# Patient Record
Sex: Female | Born: 1980
Health system: Southern US, Community
[De-identification: ages and names within clinical notes are randomized; demographics above are authoritative.]

## PROBLEM LIST (undated history)

## (undated) DIAGNOSIS — M549 Dorsalgia, unspecified: Secondary | ICD-10-CM

## (undated) DIAGNOSIS — M2550 Pain in unspecified joint: Secondary | ICD-10-CM

## (undated) DIAGNOSIS — B009 Herpesviral infection, unspecified: Secondary | ICD-10-CM

## (undated) DIAGNOSIS — J45990 Exercise induced bronchospasm: Secondary | ICD-10-CM

## (undated) DIAGNOSIS — I471 Supraventricular tachycardia: Secondary | ICD-10-CM

## (undated) DIAGNOSIS — R609 Edema, unspecified: Secondary | ICD-10-CM

## (undated) DIAGNOSIS — R112 Nausea with vomiting, unspecified: Secondary | ICD-10-CM

## (undated) DIAGNOSIS — E785 Hyperlipidemia, unspecified: Secondary | ICD-10-CM

## (undated) DIAGNOSIS — J45909 Unspecified asthma, uncomplicated: Secondary | ICD-10-CM

## (undated) DIAGNOSIS — L509 Urticaria, unspecified: Secondary | ICD-10-CM

## (undated) DIAGNOSIS — R002 Palpitations: Secondary | ICD-10-CM

## (undated) DIAGNOSIS — L309 Dermatitis, unspecified: Secondary | ICD-10-CM

## (undated) DIAGNOSIS — G4700 Insomnia, unspecified: Secondary | ICD-10-CM

## (undated) DIAGNOSIS — K219 Gastro-esophageal reflux disease without esophagitis: Secondary | ICD-10-CM

## (undated) DIAGNOSIS — K5900 Constipation, unspecified: Secondary | ICD-10-CM

## (undated) DIAGNOSIS — E559 Vitamin D deficiency, unspecified: Secondary | ICD-10-CM

## (undated) DIAGNOSIS — Z9889 Other specified postprocedural states: Secondary | ICD-10-CM

## (undated) DIAGNOSIS — F419 Anxiety disorder, unspecified: Secondary | ICD-10-CM

## (undated) DIAGNOSIS — R0602 Shortness of breath: Secondary | ICD-10-CM

## (undated) DIAGNOSIS — E039 Hypothyroidism, unspecified: Secondary | ICD-10-CM

## (undated) DIAGNOSIS — T7840XA Allergy, unspecified, initial encounter: Secondary | ICD-10-CM

## (undated) HISTORY — DX: Palpitations: R00.2

## (undated) HISTORY — DX: Hypothyroidism, unspecified: E03.9

## (undated) HISTORY — DX: Insomnia, unspecified: G47.00

## (undated) HISTORY — DX: Pain in unspecified joint: M25.50

## (undated) HISTORY — DX: Hyperlipidemia, unspecified: E78.5

## (undated) HISTORY — DX: Edema, unspecified: R60.9

## (undated) HISTORY — DX: Supraventricular tachycardia: I47.1

## (undated) HISTORY — DX: Unspecified asthma, uncomplicated: J45.909

## (undated) HISTORY — DX: Constipation, unspecified: K59.00

## (undated) HISTORY — DX: Gastro-esophageal reflux disease without esophagitis: K21.9

## (undated) HISTORY — DX: Vitamin D deficiency, unspecified: E55.9

## (undated) HISTORY — DX: Herpesviral infection, unspecified: B00.9

## (undated) HISTORY — DX: Exercise induced bronchospasm: J45.990

## (undated) HISTORY — DX: Allergy, unspecified, initial encounter: T78.40XA

## (undated) HISTORY — DX: Dermatitis, unspecified: L30.9

## (undated) HISTORY — DX: Urticaria, unspecified: L50.9

## (undated) HISTORY — DX: Dorsalgia, unspecified: M54.9

## (undated) HISTORY — PX: OTHER SURGICAL HISTORY: SHX169

## (undated) HISTORY — DX: Shortness of breath: R06.02

## (undated) HISTORY — DX: Anxiety disorder, unspecified: F41.9

---
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
## 2001-10-21 ENCOUNTER — Encounter: Admission: RE | Admit: 2001-10-21 | Discharge: 2001-10-21 | Payer: Self-pay | Admitting: Family Medicine

## 2001-10-21 ENCOUNTER — Encounter: Payer: Self-pay | Admitting: Family Medicine

## 2006-01-17 ENCOUNTER — Emergency Department (HOSPITAL_COMMUNITY): Admission: EM | Admit: 2006-01-17 | Discharge: 2006-01-17 | Payer: Self-pay | Admitting: Emergency Medicine

## 2007-12-22 ENCOUNTER — Other Ambulatory Visit: Admission: RE | Admit: 2007-12-22 | Discharge: 2007-12-22 | Payer: Self-pay | Admitting: Obstetrics and Gynecology

## 2008-12-31 ENCOUNTER — Other Ambulatory Visit: Admission: RE | Admit: 2008-12-31 | Discharge: 2008-12-31 | Payer: Self-pay | Admitting: Obstetrics and Gynecology

## 2010-02-07 ENCOUNTER — Other Ambulatory Visit: Admission: RE | Admit: 2010-02-07 | Discharge: 2010-02-07 | Payer: Self-pay | Admitting: Obstetrics and Gynecology

## 2011-02-16 ENCOUNTER — Other Ambulatory Visit: Payer: Self-pay | Admitting: Obstetrics and Gynecology

## 2011-02-16 ENCOUNTER — Other Ambulatory Visit (HOSPITAL_COMMUNITY)
Admission: RE | Admit: 2011-02-16 | Discharge: 2011-02-16 | Disposition: A | Discharge status: Home / Self Care | Payer: PRIVATE HEALTH INSURANCE | Source: Ambulatory Visit | Attending: Obstetrics and Gynecology | Admitting: Obstetrics and Gynecology

## 2011-02-16 DIAGNOSIS — Z01419 Encounter for gynecological examination (general) (routine) without abnormal findings: Secondary | ICD-10-CM | POA: Insufficient documentation

## 2012-02-23 ENCOUNTER — Other Ambulatory Visit: Payer: Self-pay | Admitting: Obstetrics and Gynecology

## 2012-02-23 ENCOUNTER — Other Ambulatory Visit (HOSPITAL_COMMUNITY)
Admission: RE | Admit: 2012-02-23 | Discharge: 2012-02-23 | Disposition: A | Discharge status: Home / Self Care | Payer: PRIVATE HEALTH INSURANCE | Source: Ambulatory Visit | Attending: Obstetrics and Gynecology | Admitting: Obstetrics and Gynecology

## 2012-02-23 DIAGNOSIS — Z01419 Encounter for gynecological examination (general) (routine) without abnormal findings: Secondary | ICD-10-CM | POA: Insufficient documentation

## 2013-03-01 ENCOUNTER — Other Ambulatory Visit (HOSPITAL_COMMUNITY)
Admission: RE | Admit: 2013-03-01 | Discharge: 2013-03-01 | Disposition: A | Discharge status: Home / Self Care | Payer: BC Managed Care – PPO | Source: Ambulatory Visit | Attending: Obstetrics and Gynecology | Admitting: Obstetrics and Gynecology

## 2013-03-01 ENCOUNTER — Other Ambulatory Visit: Payer: Self-pay | Admitting: Obstetrics and Gynecology

## 2013-03-01 DIAGNOSIS — Z1151 Encounter for screening for human papillomavirus (HPV): Secondary | ICD-10-CM | POA: Insufficient documentation

## 2013-03-01 DIAGNOSIS — Z01419 Encounter for gynecological examination (general) (routine) without abnormal findings: Secondary | ICD-10-CM | POA: Insufficient documentation

## 2013-06-22 ENCOUNTER — Other Ambulatory Visit: Payer: Self-pay | Admitting: Physician Assistant

## 2016-03-11 ENCOUNTER — Other Ambulatory Visit (HOSPITAL_COMMUNITY)
Admission: RE | Admit: 2016-03-11 | Discharge: 2016-03-11 | Disposition: A | Discharge status: Home / Self Care | Payer: BLUE CROSS/BLUE SHIELD | Source: Ambulatory Visit | Attending: Obstetrics and Gynecology | Admitting: Obstetrics and Gynecology

## 2016-03-11 ENCOUNTER — Other Ambulatory Visit: Payer: Self-pay | Admitting: Obstetrics and Gynecology

## 2016-03-11 DIAGNOSIS — Z01419 Encounter for gynecological examination (general) (routine) without abnormal findings: Secondary | ICD-10-CM | POA: Insufficient documentation

## 2016-03-11 DIAGNOSIS — Z1151 Encounter for screening for human papillomavirus (HPV): Secondary | ICD-10-CM | POA: Diagnosis present

## 2016-03-12 LAB — CYTOLOGY - PAP

## 2016-06-10 ENCOUNTER — Ambulatory Visit (INDEPENDENT_AMBULATORY_CARE_PROVIDER_SITE_OTHER): Payer: BLUE CROSS/BLUE SHIELD | Admitting: Allergy

## 2016-06-10 ENCOUNTER — Encounter: Payer: Self-pay | Admitting: Allergy

## 2016-06-10 VITALS — BP 110/75 | HR 75 | Temp 98.0°F | Resp 15 | Ht 64.17 in | Wt 218.4 lb

## 2016-06-10 DIAGNOSIS — J45990 Exercise induced bronchospasm: Secondary | ICD-10-CM | POA: Diagnosis not present

## 2016-06-10 DIAGNOSIS — J301 Allergic rhinitis due to pollen: Secondary | ICD-10-CM | POA: Diagnosis not present

## 2016-06-10 DIAGNOSIS — L501 Idiopathic urticaria: Secondary | ICD-10-CM

## 2016-06-10 MED ORDER — ALBUTEROL SULFATE HFA 108 (90 BASE) MCG/ACT IN AERS: 2.0000 | INHALATION_SPRAY | Freq: Four times a day (QID) | RESPIRATORY_TRACT | 2 refills | Status: DC | PRN

## 2016-06-10 MED ORDER — LEVOCETIRIZINE DIHYDROCHLORIDE 5 MG PO TABS: 5.0000 mg | ORAL_TABLET | Freq: Every evening | ORAL | 1 refills | Status: DC

## 2016-06-10 NOTE — Patient Instructions (Addendum)
1. Allergic rhinitis due to pollen Seasonal and year-round allergies to grass, weeds, mold, dust mites and cat.   Start Xyzal and OTC nasal steroid spray (ie. Flonase, Nasacort) 1-2 sprays each nostril daily.  Demonstrated appropriate nasal spray technique. Will start with medication management.  If symptoms continue despite above medications may benefit from trial of Singulair.   Discussed Allergen immunotherapy today and provided with information packet and consent.    2. Idiopathic urticaria Hives without concerning features and are intermittent.  Advised use of antihistamine (xyzal) at onset of hives and continue use until hives have resolved.  May use up to twice day dosing if hives persist and can call if not improving.   Discussed strawberry is known to cause histamine release especially in large quantities.  Skin testing negative to foods today.  Does not need to completely avoid strawberry but know it may cause hives and would stop ingestion if hives returns with ingestion.    3. Exercise-induced bronchospasm Chest-tightness and wheezing with exercise only.  Spirometry today is normal.   Will prescribe albuterol for use prior to activity and as needed.   Keep track of frequency of albuterol use to relieve symptoms.    FU 3-4 mo

## 2016-06-10 NOTE — Progress Notes (Signed)
New Patient Note  RE: Madison [name] MRN: [id number] DOB: 11-Aug-1981 Date of Office Visit: 06/10/2016  Referring provider: No ref. provider found Primary care provider: REDMON,NOELLE, PA-C  Chief Complaint: nasal congestion, wheezing, hives  History of present illness: Madison [name] is a 35 y.o. female presenting today for evaluation of environmental allergy, hives and wheezing with activity.    Allergy - She has nasal congestion, occasional sinus pressure with weather change and sneezing.  Symptoms worse in spring and summer.  Denies ocular symptoms.  Using claritin nightly with atarax which reports helps nasal congestion.  Has saline nasal spray that she has used occasionally.  Has tried zyrtec-makes sleepy and allegra - feels too dry.  Has not tried nasal steroid sprays.  Also notices around cats becomes itchy.    She has no pets in her apartment but boyfriend with cat.  Also reports her part-time job at [employer] is dusty and nasal symptoms are worse there.    Hives - developed hives in Oct 2016 and again in May 2017.  Both times she went to a walk-in clinic for rash that developed on arms and chest. Recalls being treated with dexomethasone shot.  Rash was red raised bumps, very itchy.  Hives do not last longer than 24 hours.  No associated swelling, fever, joint pain/arthralgias.  Never had hives prior to last year.  She did noticed that she developed hives after large strawberry ingestion and believes milk/dairy may be a trigger.  After dairy ingestion reports hives with to 1hr as well as stomach ache and nausea.        Wheezing/chest-tightness:  When she runs notices some chest tightness and afterwards will have some wheezing.  Started about 1 mo ago.  Also reports strong perfumes with elicit wheezing and coughing.  Has used symbicort before with cold with coughing and did have improve about 26mo ago.  Has never used albuterol.  No oral steroids or hospitalizations.  No history of  asthma diagnosis.    Other allergy - Had patch testing thru dermatology and was found to be allergic latex and neosporin.  Reports at previous job with latex gloves would get bumpy rash. She avoids latex and neosporin.     Review of systems: Review of Systems  Constitutional: Negative for fever.  Cardiovascular: Negative.   Gastrointestinal: Negative for diarrhea and vomiting.  Musculoskeletal: Negative for joint pain.  Neurological: Negative for headaches.  All other systems reviewed and are negative.    Past medical history: Past Medical History:  Diagnosis Date  . Allergy   . Herpes simplex disease   . Hypothyroidism     Past surgical history: Past Surgical History:  Procedure Laterality Date  . KNEE ARTHROSCOPY Right 2001  . WISDOM TOOTH EXTRACTION Bilateral 1997    Family history:  Family History  Problem Relation Age of Onset  . Eczema Father   . Allergic rhinitis Neg Hx   . Angioedema Neg Hx   . Asthma Neg Hx   . Atopy Neg Hx   . Immunodeficiency Neg Hx   . Urticaria Neg Hx     Social history: Social History  :  Lives in apartment with carpeting. No pets. Electric heat and central cooling.    Social History  . Marital status: Single   Occupational History  . CMA    Social History Main Topics  . Smoking status: Never Smoker  . Smokeless tobacco: Never Used  . Alcohol use Yes  .  Drug use: No  . Sexual activity: Not on file     Medication List:   Medication List       Accurate as of 06/10/16 12:52 PM. Always use your most recent med list.          albuterol 108 (90 Base) MCG/ACT inhaler Commonly known as:  PROVENTIL HFA;VENTOLIN HFA Inhale 2 puffs into the lungs every 6 (six) hours as needed for wheezing or shortness of breath. May use 10-20 prior to activity.   drospirenone-ethinyl estradiol 3-0.03 MG tablet Commonly known as:  YASMIN,ZARAH,SYEDA   Fish Oil 1000 MG Caps Take 2 capsules by mouth 2 (two) times daily.   levocetirizine  5 MG tablet Commonly known as:  XYZAL Take 1 tablet (5 mg total) by mouth every evening.   levothyroxine 75 MCG tablet Commonly known as:  SYNTHROID, LEVOTHROID   mometasone 0.1 % cream Commonly known as:  ELOCON   naproxen sodium 550 MG tablet Commonly known as:  ANAPROX   triazolam 0.25 MG tablet Commonly known as:  HALCION   valACYclovir 500 MG tablet Commonly known as:  VALTREX       Known medication allergies: Allergies  Allergen Reactions  . Latex Rash  . Neosporin [Neomycin-Bacitracin Zn-Polymyx] Rash     Physical examination: Blood pressure 110/75, pulse 75, temperature 98 F (36.7 C), temperature source Oral, resp. rate 15, height 5' 4.17" (1.63 m), weight 218 lb 6.4 oz (99.1 kg).  General: Alert, interactive, in no acute distress. HEENT: TMs pearly gray, turbinates edematous and pale with crusty discharge, post-pharynx unremarkable. Neck: Supple without lymphadenopathy. Lungs: Clear to auscultation without wheezing, rhonchi or rales. CV: Normal S1, S2 without murmurs. Abdomen: Nondistended, nontender. Skin: erythematous papular lesions on forearms; flesh colored papules on upper arms c/w KP. Extremities:  No clubbing, cyanosis or edema. Neuro:   Grossly intact.  Diagnositics/Labs:  Spirometry: results normal  Allergy testing: positive to grass, weed, mold, dust mites, cat.   Negative for milk and strawberry.  Allergy testing results were read and interpreted by provider, documented by clinical staff.   Assessment and plan:   1. Allergic rhinitis due to pollen Seasonal and year-round allergy to grass, weeds, mold, dust mites and cat.   Start Xyzal and OTC nasal steroid spray (ie. Flonase, Nasacort) 1-2 sprays each nostril daily.  Demonstrated appropriate nasal spray technique. Will start with medication management.  If symptoms continue despite above medications may benefit from trial of Singulair.   Discussed Allergen immunotherapy today and  provided with information packet and consent.  Will plan further discussion on next visit re initiation.    2. Idiopathic urticaria Hives without concerning features and are intermittent.  Advised use of antihistamine (xyzal) at onset of hives and continue use until hives have resolved.  May use up to twice day dosing if hives persist and can call if not improving.   Discussed strawberry is known to cause histamine release especially in large quantities.  Skin testing negative to foods today.  Does not need to completely avoid strawberry but know it may cause hives and would stop ingestion if hives returns with ingestion.    3. Exercise-induced bronchospasm Chest-tightness and wheezing with exercise only.  Spirometry today is normal.   Will prescribe albuterol for use prior to activity and as needed.   Keep track of frequency of albuterol use to relieve symptoms.     Return in about 3 months (around 09/10/2016).  I appreciate the opportunity to take part in Gionni's  care. Please do not hesitate to contact me with questions.  Sincerely,   Margo Aye, MD

## 2016-06-12 DIAGNOSIS — L501 Idiopathic urticaria: Secondary | ICD-10-CM | POA: Insufficient documentation

## 2016-06-12 DIAGNOSIS — J301 Allergic rhinitis due to pollen: Secondary | ICD-10-CM | POA: Insufficient documentation

## 2016-06-12 DIAGNOSIS — J45990 Exercise induced bronchospasm: Secondary | ICD-10-CM

## 2016-06-12 HISTORY — DX: Exercise induced bronchospasm: J45.990

## 2016-07-21 ENCOUNTER — Telehealth: Payer: Self-pay | Admitting: Allergy and Immunology

## 2016-07-22 ENCOUNTER — Ambulatory Visit: Payer: BLUE CROSS/BLUE SHIELD | Admitting: Allergy

## 2016-09-21 ENCOUNTER — Ambulatory Visit: Payer: BLUE CROSS/BLUE SHIELD | Admitting: Allergy

## 2016-11-12 ENCOUNTER — Encounter (HOSPITAL_COMMUNITY): Payer: Self-pay | Admitting: Emergency Medicine

## 2016-11-12 ENCOUNTER — Emergency Department (HOSPITAL_COMMUNITY)
Admission: EM | Admit: 2016-11-12 | Discharge: 2016-11-12 | Disposition: A | Discharge status: Home / Self Care | Payer: BLUE CROSS/BLUE SHIELD | Attending: Emergency Medicine | Admitting: Emergency Medicine

## 2016-11-12 DIAGNOSIS — Z91040 Latex allergy status: Secondary | ICD-10-CM | POA: Diagnosis not present

## 2016-11-12 DIAGNOSIS — Z79899 Other long term (current) drug therapy: Secondary | ICD-10-CM | POA: Diagnosis not present

## 2016-11-12 DIAGNOSIS — I471 Supraventricular tachycardia: Secondary | ICD-10-CM | POA: Insufficient documentation

## 2016-11-12 DIAGNOSIS — R000 Tachycardia, unspecified: Secondary | ICD-10-CM | POA: Diagnosis present

## 2016-11-12 DIAGNOSIS — E039 Hypothyroidism, unspecified: Secondary | ICD-10-CM | POA: Diagnosis not present

## 2016-11-12 MED ORDER — DILTIAZEM HCL ER 60 MG PO CP12
60.0000 mg | ORAL_CAPSULE | Freq: Once | ORAL | Status: AC
  Administered 2016-11-12: 60 mg via ORAL
  Filled 2016-11-12: qty 1

## 2016-11-12 NOTE — Discharge Instructions (Signed)
Recheck with cardiology.  Return to ER with recurrence.

## 2016-11-12 NOTE — ED Triage Notes (Signed)
Patient states she woke up and felt like her heart was beating funny. Patient states this started around 4 am today.

## 2016-11-12 NOTE — ED Provider Notes (Signed)
[facility]-EMERGENCY DEPT Provider Note   CSN: [id number] Arrival date & time: 11/12/16  0455     History   Chief Complaint Chief Complaint  Patient presents with  . Tachycardia    HPI Madison [name] is a 36 y.o. female.  HPI :  She presents for evaluation of palpitations. She was sleeping tonight. Proximal and 4 AM she awakened with a feeling of tachycardia palpitations. This persisted. She was able to drive herself here. She has chest tightness. No chest pain or shortness of breath. Had an episode about 10 years ago where she was seated in a cart in the emergency room. Saw Dr. Carolanne Grumblingracy Turner of cardiology. Was diagnosed with SVT. His only had 1 recurrence since that time this was brief and resolved spontaneously.  History of hypothyroidism. Is on Synthroid 100 mics. This was increased from 75 mics 4 days ago.  Past Medical History:  Diagnosis Date  . Allergy   . Herpes simplex disease   . Hypothyroidism     Patient Active Problem List   Diagnosis Date Noted  . Allergic rhinitis due to pollen 06/12/2016  . Idiopathic urticaria 06/12/2016  . Exercise-induced bronchospasm 06/12/2016    Past Surgical History:  Procedure Laterality Date  . KNEE ARTHROSCOPY Right 2001  . WISDOM TOOTH EXTRACTION Bilateral 1997    OB History    No data available       Home Medications    Prior to Admission medications   Medication Sig Start Date End Date Taking? Authorizing Provider  albuterol (PROVENTIL HFA;VENTOLIN HFA) 108 (90 Base) MCG/ACT inhaler Inhale 2 puffs into the lungs every 6 (six) hours as needed for wheezing or shortness of breath. May use 10-20 prior to activity. 06/10/16  Yes Shaylar Larose HiresPatricia Padgett, MD  drospirenone-ethinyl estradiol Birder Robson(YASMIN,ZARAH,SYEDA) 3-0.03 MG tablet  04/13/16  Yes Historical Provider, MD  hydrOXYzine (ATARAX/VISTARIL) 50 MG tablet Take 1-2 tablets by mouth at bedtime. 08/05/16  Yes Historical Provider, MD  levocetirizine (XYZAL) 5 MG tablet Take 1 tablet (5  mg total) by mouth every evening. 06/10/16  Yes Shaylar Larose HiresPatricia Padgett, MD  levothyroxine (SYNTHROID, LEVOTHROID) 100 MCG tablet Take 100 mcg by mouth daily before breakfast.   Yes Historical Provider, MD  loratadine (CLARITIN) 10 MG tablet Take 10 mg by mouth daily.   Yes Historical Provider, MD  Omega-3 Fatty Acids (FISH OIL) 1000 MG CAPS Take 2 capsules by mouth 2 (two) times daily.   Yes Historical Provider, MD  Red Yeast Rice Extract (RED YEAST RICE PO) Take 1 tablet by mouth 2 (two) times daily.   Yes Historical Provider, MD  valACYclovir (VALTREX) 500 MG tablet Take 500 mg by mouth daily.  03/27/16  Yes Historical Provider, MD    Family History Family History  Problem Relation Age of Onset  . Eczema Father   . Allergic rhinitis Neg Hx   . Angioedema Neg Hx   . Asthma Neg Hx   . Atopy Neg Hx   . Immunodeficiency Neg Hx   . Urticaria Neg Hx     Social History Social History  Substance Use Topics  . Smoking status: Never Smoker  . Smokeless tobacco: Never Used  . Alcohol use Yes     Allergies   Latex; Neosporin [neomycin-bacitracin zn-polymyx]; and Toradol [ketorolac tromethamine]   Review of Systems Review of Systems  Constitutional: Negative for appetite change, chills, diaphoresis, fatigue and fever.  HENT: Negative for mouth sores, sore throat and trouble swallowing.   Eyes: Negative for visual  disturbance.  Respiratory: Positive for chest tightness. Negative for cough, shortness of breath and wheezing.   Cardiovascular: Positive for palpitations. Negative for chest pain.  Gastrointestinal: Negative for abdominal distention, abdominal pain, diarrhea, nausea and vomiting.  Endocrine: Negative for polydipsia, polyphagia and polyuria.  Genitourinary: Negative for dysuria, frequency and hematuria.  Musculoskeletal: Negative for gait problem.  Skin: Negative for color change, pallor and rash.  Neurological: Negative for dizziness, syncope, light-headedness and  headaches.  Hematological: Does not bruise/bleed easily.  Psychiatric/Behavioral: Negative for behavioral problems and confusion.     Physical Exam Updated Vital Signs BP 120/91   Pulse 102   Temp 97.4 F (36.3 C) (Oral)   Resp 18   Ht 5\' 5"  (1.651 m)   Wt 213 lb 12.8 oz (97 kg)   LMP 11/05/2016   SpO2 97%   BMI 35.58 kg/m   Physical Exam  Constitutional: She is oriented to person, place, and time. She appears well-developed and well-nourished. No distress.  Awake and alert. Anxious.  HENT:  Head: Normocephalic.  Eyes: Conjunctivae are normal. Pupils are equal, round, and reactive to light. No scleral icterus.  Neck: Normal range of motion. Neck supple. No thyromegaly present.  Cardiovascular: Normal rate and regular rhythm.  Exam reveals no gallop and no friction rub.   No murmur heard. As sheis being placed in a bed, I am palpating her radial pulse and it does not feel fast. She states that between triage and being placed in the room she thought it "got better".  Pulmonary/Chest: Effort normal and breath sounds normal. No respiratory distress. She has no wheezes. She has no rales.  Abdominal: Soft. Bowel sounds are normal. She exhibits no distension. There is no tenderness. There is no rebound.  Musculoskeletal: Normal range of motion.  Neurological: She is alert and oriented to person, place, and time.  Skin: Skin is warm and dry. No rash noted.  Psychiatric: She has a normal mood and affect. Her behavior is normal.     ED Treatments / Results  Labs (all labs ordered are listed, but only abnormal results are displayed) Labs Reviewed - No data to display  EKG  EKG Interpretation None       Radiology No results found.  Procedures Procedures (including critical care time)  Medications Ordered in ED Medications  diltiazem (CARDIZEM SR) 12 hr capsule 60 mg (60 mg Oral Given 11/12/16 0555)     Initial Impression / Assessment and Plan / ED Course  I have  reviewed the triage vital signs and the nursing notes.  Pertinent labs & imaging results that were available during my care of the patient were reviewed by me and considered in my medical decision making (see chart for details).  Clinical Course    Following placed on the monitor with EKG patient's found to be in sinus rhythm. Given a single dose of by mouth Cardizem. She has a history of excising cold induced asthma and uses beta agonist for this, thus no beta blocker given. She maintains her sinus rhythm over the course of the next hours should be appropriate for discharge again to follow up with Dr. Mayford Knife to discuss any additional treatment.   Final Clinical Impressions(s) / ED Diagnoses   Final diagnoses:  SVT (supraventricular tachycardia) (HCC)    New Prescriptions New Prescriptions   No medications on file     Rolland Porter, MD 11/12/16 [phone]

## 2016-11-24 ENCOUNTER — Encounter (HOSPITAL_COMMUNITY): Payer: Self-pay

## 2016-11-24 ENCOUNTER — Other Ambulatory Visit: Payer: Self-pay

## 2016-11-24 ENCOUNTER — Emergency Department (HOSPITAL_COMMUNITY)
Admission: EM | Admit: 2016-11-24 | Discharge: 2016-11-24 | Disposition: A | Discharge status: Home / Self Care | Payer: BLUE CROSS/BLUE SHIELD | Attending: Emergency Medicine | Admitting: Emergency Medicine

## 2016-11-24 DIAGNOSIS — E039 Hypothyroidism, unspecified: Secondary | ICD-10-CM | POA: Diagnosis not present

## 2016-11-24 DIAGNOSIS — Z91040 Latex allergy status: Secondary | ICD-10-CM | POA: Insufficient documentation

## 2016-11-24 DIAGNOSIS — I471 Supraventricular tachycardia: Secondary | ICD-10-CM | POA: Diagnosis not present

## 2016-11-24 DIAGNOSIS — R000 Tachycardia, unspecified: Secondary | ICD-10-CM | POA: Diagnosis present

## 2016-11-24 LAB — TSH: TSH: 4.214 u[IU]/mL (ref 0.350–4.500)

## 2016-11-24 LAB — I-STAT TROPONIN, ED: Troponin i, poc: 0 ng/mL (ref 0.00–0.08)

## 2016-11-24 NOTE — ED Provider Notes (Addendum)
[facility]ER[facility] DEPT Provider Note: Lowella Dell, MD, FACEP  By signing my name below, I, Nelwyn Salisbury, attest that this documentation has been prepared under the direction and in the presence of Paula Libra, MD . Electronically Signed: Nelwyn Salisbury, Scribe. 11/24/2016. 3:52 AM.  CSN: [id number] MRN: [id number] ARRIVAL: 11/24/16 at 0336 ROOM: [facility]   CHIEF COMPLAINT  Tachycardia   HISTORY OF PRESENT ILLNESS  Madison [name] is a 36 y.o. female who presents to the Emergency Department complaining of sudden-onset heart palpitations beginning about an hour ago. She characterizes the palpitations as a rapid and irregular heart rate. Pt arrived in the emergency room with a heart rate of 170 in SVT but converted spontaneously prior to physician evaluation. She has a history of SVT with her last episode occurring about a week and a half ago and her symptoms today were congruent with her previous episodes. Pt reports associated chest tightness and SOB on exertion, however after spontaneous conversion pt is asymptomatic. She denies any chest pain.  Past Medical History:  Diagnosis Date  . Allergy   . Herpes simplex disease   . Hypothyroidism     Past Surgical History:  Procedure Laterality Date  . KNEE ARTHROSCOPY Right 2001  . WISDOM TOOTH EXTRACTION Bilateral 1997    Family History  Problem Relation Age of Onset  . Eczema Father   . Allergic rhinitis Neg Hx   . Angioedema Neg Hx   . Asthma Neg Hx   . Atopy Neg Hx   . Immunodeficiency Neg Hx   . Urticaria Neg Hx     Social History  Substance Use Topics  . Smoking status: Never Smoker  . Smokeless tobacco: Never Used  . Alcohol use Yes    Prior to Admission medications   Medication Sig Start Date End Date Taking? Authorizing Provider  albuterol (PROVENTIL HFA;VENTOLIN HFA) 108 (90 Base) MCG/ACT inhaler Inhale 2 puffs into the lungs every 6 (six) hours as needed for wheezing or shortness of breath. May use 10-20 prior to  activity. 06/10/16  Yes Shaylar Larose Hires, MD  drospirenone-ethinyl estradiol (YASMIN,ZARAH,SYEDA) 3-0.03 MG tablet Take 1 tablet by mouth daily.  04/13/16  Yes Historical Provider, MD  hydrOXYzine (ATARAX/VISTARIL) 50 MG tablet Take 1-2 tablets by mouth at bedtime. 08/05/16  Yes Historical Provider, MD  levocetirizine (XYZAL) 5 MG tablet Take 1 tablet (5 mg total) by mouth every evening. 06/10/16  Yes Shaylar Larose Hires, MD  levothyroxine (SYNTHROID, LEVOTHROID) 100 MCG tablet Take 100 mcg by mouth daily before breakfast.   Yes Historical Provider, MD  loratadine (CLARITIN) 10 MG tablet Take 10 mg by mouth daily.   Yes Historical Provider, MD  Omega-3 Fatty Acids (FISH OIL) 1000 MG CAPS Take 2 capsules by mouth 2 (two) times daily.   Yes Historical Provider, MD  Red Yeast Rice Extract (RED YEAST RICE PO) Take 1 tablet by mouth 2 (two) times daily.   Yes Historical Provider, MD  valACYclovir (VALTREX) 500 MG tablet Take 500 mg by mouth daily.  03/27/16  Yes Historical Provider, MD    Allergies Latex; Neosporin [neomycin-bacitracin zn-polymyx]; and Toradol [ketorolac tromethamine]   REVIEW OF SYSTEMS  Negative except as noted here or in the History of Present Illness.   PHYSICAL EXAMINATION  Initial Vital Signs Last menstrual period 11/05/2016.  Examination General: Well-developed, well-nourished female in no acute distress; appearance consistent with age of record HENT: normocephalic; atraumatic Eyes: pupils equal, round and reactive to light; extraocular muscles intact Neck: supple  Heart: regular rate and rhythm; sinus tachycardia on monitor Lungs: clear to auscultation bilaterally Abdomen: soft; nondistended; nontender; no masses or hepatosplenomegaly; bowel sounds present Extremities: No deformity; full range of motion; pulses normal Neurologic: Awake, alert and oriented; motor function intact in all extremities and symmetric; no facial droop Skin: Warm and dry Psychiatric:  Normal mood and affect   RESULTS  Summary of this visit's results, reviewed by myself:  EKG Interpretation:  Date & Time: 11/24/2016 3:46 AM  Rate: 104  Rhythm: sinus tachycardia  QRS Axis: normal  Intervals: normal  ST/T Wave abnormalities: normal  Conduction Disutrbances:none  Narrative Interpretation:   Old EKG Reviewed: No significant changes    Laboratory Studies: Results for orders placed or performed during the hospital encounter of 11/24/16 (from the past 24 hour(s))  TSH     Status: None   Collection Time: 11/24/16  4:00 AM  Result Value Ref Range   TSH 4.214 0.350 - 4.500 uIU/mL  I-stat troponin, ED     Status: None   Collection Time: 11/24/16  4:41 AM  Result Value Ref Range   Troponin i, poc 0.00 0.00 - 0.08 ng/mL   Comment 3           Imaging Studies: No results found.  ED COURSE  Nursing notes and initial vitals signs, including pulse oximetry, reviewed.  Vitals:   11/24/16 0408 11/24/16 0428  BP:  115/82  Pulse:  93  Resp:  13  SpO2: 99% 99%    PROCEDURES    ED DIAGNOSES     ICD-9-CM ICD-10-CM   1. SVT (supraventricular tachycardia) (HCC) 427.89 I47.1     I personally performed the services described in this documentation, which was scribed in my presence. The recorded information has been reviewed and is accurate.     Paula LibraJohn Jaylie Neaves, MD 11/24/16 [id number]    Paula LibraJohn Samie Reasons, MD 11/24/16 [phone]

## 2016-11-24 NOTE — ED Triage Notes (Signed)
Pt has been seen last week for the same and she has a cardiologist appt on Feb2

## 2016-11-26 ENCOUNTER — Ambulatory Visit: Payer: PRIVATE HEALTH INSURANCE | Admitting: Cardiology

## 2016-11-30 ENCOUNTER — Ambulatory Visit (INDEPENDENT_AMBULATORY_CARE_PROVIDER_SITE_OTHER): Payer: BLUE CROSS/BLUE SHIELD | Admitting: Cardiology

## 2016-11-30 ENCOUNTER — Encounter: Payer: Self-pay | Admitting: Cardiology

## 2016-11-30 DIAGNOSIS — I471 Supraventricular tachycardia, unspecified: Secondary | ICD-10-CM | POA: Insufficient documentation

## 2016-11-30 DIAGNOSIS — R079 Chest pain, unspecified: Secondary | ICD-10-CM | POA: Diagnosis not present

## 2016-11-30 HISTORY — DX: Supraventricular tachycardia: I47.1

## 2016-11-30 HISTORY — DX: Supraventricular tachycardia, unspecified: I47.10

## 2016-11-30 MED ORDER — DILTIAZEM HCL ER COATED BEADS 120 MG PO CP24: 120.0000 mg | ORAL_CAPSULE | Freq: Every day | ORAL | 3 refills | Status: DC

## 2016-11-30 NOTE — Progress Notes (Signed)
Cardiology Office Note    Date:  11/30/2016   ID:  GEORGETTA CRAFTON, DOB 03/15/1981, MRN [id number]  PCP:  REDMON,NOELLE, PA-C  Cardiologist:  Armanda Magic, MD   Chief Complaint  Patient presents with  . Palpitations    History of Present Illness:  Madison [name] is a 36 y.o. female with a history of hypothyroidism and SVT diagnosed 10 years ago who presented to the ER on 1/16 2018 with palpitations and was found to be in SVT.  In ER her HR was 170 and spontaneously converted to NSR prior to ER MD seeing her.  She had another episode of SVT at 210 bpm on the pulse ox 1 week prior and went to the ER and was given a single dose of Cardizem but was in NSR by the time an EKG could be done.  She tried vagal manuevers at home without any change.   She states that she experienced CP with the palpitations and SOB and these resolved immediately after converting to NSR. She denies any exertional CP or DOE, LE edema or syncope.  She exercises and has no problems with SOB or CP. She is now referred for further evaluation. She had her thyroid med adjusted (increased) in December.  She says these palpitations never occur when exercise and usually wake her up from sleep. She does have some dizziness when squatting and getting back up.  Past Medical History:  Diagnosis Date  . Allergy   . Exercise-induced bronchospasm 06/12/2016  . Herpes simplex disease   . Hypothyroidism   . SVT (supraventricular tachycardia) (HCC) 11/30/2016    Past Surgical History:  Procedure Laterality Date  . KNEE ARTHROSCOPY Right 2001  . WISDOM TOOTH EXTRACTION Bilateral 1997    Current Medications: Outpatient Medications Prior to Visit  Medication Sig Dispense Refill  . albuterol (PROVENTIL HFA;VENTOLIN HFA) 108 (90 Base) MCG/ACT inhaler Inhale 2 puffs into the lungs every 6 (six) hours as needed for wheezing or shortness of breath. May use 10-20 prior to activity. 1 Inhaler 2  . drospirenone-ethinyl estradiol  (YASMIN,ZARAH,SYEDA) 3-0.03 MG tablet Take 1 tablet by mouth daily.     . hydrOXYzine (ATARAX/VISTARIL) 50 MG tablet Take 1-2 tablets by mouth at bedtime.  0  . levocetirizine (XYZAL) 5 MG tablet Take 1 tablet (5 mg total) by mouth every evening. 90 tablet 1  . levothyroxine (SYNTHROID, LEVOTHROID) 100 MCG tablet Take 100 mcg by mouth daily before breakfast.    . loratadine (CLARITIN) 10 MG tablet Take 10 mg by mouth daily.    . Omega-3 Fatty Acids (FISH OIL) 1000 MG CAPS Take 2 capsules by mouth 2 (two) times daily.    . Red Yeast Rice Extract (RED YEAST RICE PO) Take 1 tablet by mouth 2 (two) times daily.    . valACYclovir (VALTREX) 500 MG tablet Take 500 mg by mouth daily.      No facility-administered medications prior to visit.      Allergies:   Latex; Neosporin [neomycin-bacitracin zn-polymyx]; and Toradol [ketorolac tromethamine]   Social History   Social History  . Marital status: Single    Spouse name: N/A  . Number of children: N/A  . Years of education: N/A   Occupational History  . CMA    Social History Main Topics  . Smoking status: Never Smoker  . Smokeless tobacco: Never Used  . Alcohol use Yes  . Drug use: No  . Sexual activity: Not Asked   Other Topics Concern  .  None   Social History Narrative   Lives in apartment with carpeting.  No pets.  Electric heat and central cooling.       Family History:  The patient's family history includes Eczema in her father.   ROS:   Please see the history of present illness.    ROS All other systems reviewed and are negative.  PAD Screen 11/30/2016  Previous PAD dx? No  Previous surgical procedure? No  Pain with walking? No  Feet/toe relief with dangling? No  Painful, non-healing ulcers? No  Extremities discolored? No       PHYSICAL EXAM:   VS:  BP 124/84   Pulse 95   Ht 5' 4.25" (1.632 m)   Wt 221 lb 6.4 oz (100.4 kg)   LMP 11/05/2016   SpO2 98%   BMI 37.71 kg/m    GEN: Well nourished, well developed,  in no acute distress  HEENT: normal  Neck: no JVD, carotid bruits, or masses Cardiac: RRR; no murmurs, rubs, or gallops,no edema.  Intact distal pulses bilaterally.  Respiratory:  clear to auscultation bilaterally, normal work of breathing GI: soft, nontender, nondistended, + BS MS: no deformity or atrophy  Skin: warm and dry, no rash Neuro:  Alert and Oriented x 3, Strength and sensation are intact Psych: euthymic mood, full affect  Wt Readings from Last 3 Encounters:  11/30/16 221 lb 6.4 oz (100.4 kg)  11/12/16 213 lb 12.8 oz (97 kg)  06/10/16 218 lb 6.4 oz (99.1 kg)      Studies/Labs Reviewed:   EKG:  EKG is not ordered today.   Recent Labs: 11/24/2016: TSH 4.214   Lipid Panel No results found for: CHOL, TRIG, HDL, CHOLHDL, VLDL, LDLCALC, LDLDIRECT  Additional studies/ records that were reviewed today include:  ER office notes    ASSESSMENT:    1. SVT (supraventricular tachycardia) (HCC)   2. Chest pain, unspecified type      PLAN:  In order of problems listed above:  1. SVT - she has a remote history of this but recently it appears to have reoccurred and was caught on telemetry but not on 12 lead EKG and has always resolved before she could get an EKG done. I will start her on Cardizem CD 120mg  daily. She has allergies and ? Asthma so will avoid BB.I will get a 30 day event monitor and check a 2D echo to assess for structural heart disease. 2. Chest pain that only occurs with the palpitations and resolved immediately after she converts to NSR.  This is likely due to the palpitations.  She has no significant CRFs and can exercise with no CP or SOB.    Medication Adjustments/Labs and Tests Ordered: Current medicines are reviewed at length with the patient today.  Concerns regarding medicines are outlined above.  Medication changes, Labs and Tests ordered today are listed in the Patient Instructions below.  There are no Patient Instructions on file for this  visit.   Signed, Armanda Magicraci Turner, MD  11/30/2016 2:05 PM    [facility] [address], [place], [place]  [id number] Phone: [phone](336) [phone]; Fax: [phone]) [phone]

## 2016-11-30 NOTE — Patient Instructions (Signed)
Medication Instructions:  1) START CARDIZEM CD 120 mg daily  Labwork: None  Testing/Procedures: Your physician has recommended that you wear an event monitor. Event monitors are medical devices that record the heart's electrical activity. Doctors most often us these monitors to diagnose arrhythmias. Arrhythmias are problems with the speed or rhythm of the heartbeat. The monitor is a small, portable device. You can wear one while you do your normal daily activities. This is usually used to diagnose what is causing palpitations/syncope (passing out).  Follow-Up: Your physician wants you to follow-up in: 1 year with Dr. Mayford Knifeurner. You will receive a reminder letter in the mail two months in advance. If you don't receive a letter, please call our office to schedule the follow-up appointment.   Any Other Special Instructions Will Be Listed Below (If Applicable).     If you need a refill on your cardiac medications before your next appointment, please call your pharmacy.

## 2016-12-01 ENCOUNTER — Ambulatory Visit (INDEPENDENT_AMBULATORY_CARE_PROVIDER_SITE_OTHER): Payer: BLUE CROSS/BLUE SHIELD

## 2016-12-01 ENCOUNTER — Encounter: Payer: Self-pay | Admitting: Cardiology

## 2016-12-01 DIAGNOSIS — I471 Supraventricular tachycardia: Secondary | ICD-10-CM

## 2016-12-08 ENCOUNTER — Other Ambulatory Visit: Payer: Self-pay | Admitting: Allergy

## 2016-12-08 ENCOUNTER — Telehealth: Payer: Self-pay

## 2016-12-08 DIAGNOSIS — I471 Supraventricular tachycardia: Secondary | ICD-10-CM

## 2016-12-08 DIAGNOSIS — J301 Allergic rhinitis due to pollen: Secondary | ICD-10-CM

## 2016-12-08 NOTE — Telephone Encounter (Signed)
Left message to call back  

## 2016-12-08 NOTE — Telephone Encounter (Signed)
Per Dr. Norris Crossurner's note, "She has allergies and ? Asthma so will avoid BB.I will get a 30 day event monitor and check a 2D echo to assess for structural heart disease."  Monitor ordered at OV but ECHO was not.  Called patient to notify patient of Dr. Norris Crossurner's recommendation for ECHO.  Left message to call back.

## 2016-12-08 NOTE — Telephone Encounter (Signed)
Follow up   Pt verbalized that she is returning call for rn and to leave a detailed message

## 2016-12-08 NOTE — Telephone Encounter (Signed)
Patient agrees to echocardiogram. ECHO ordered for scheduling.

## 2016-12-09 ENCOUNTER — Telehealth (HOSPITAL_COMMUNITY): Payer: Self-pay | Admitting: Cardiology

## 2016-12-09 NOTE — Telephone Encounter (Signed)
Patient has scheduled ECHO 2/13 at 1600.

## 2016-12-10 NOTE — Telephone Encounter (Signed)
12/09/16 Left Message - Called pt and lmsg for her to CB.Marland [email or web address].RG     By Elita Booneegina A Chrishaun Sasso

## 2016-12-11 ENCOUNTER — Ambulatory Visit: Payer: PRIVATE HEALTH INSURANCE | Admitting: Cardiology

## 2016-12-22 ENCOUNTER — Other Ambulatory Visit: Payer: Self-pay

## 2016-12-22 ENCOUNTER — Ambulatory Visit (HOSPITAL_COMMUNITY): Payer: BLUE CROSS/BLUE SHIELD | Attending: Cardiology

## 2016-12-22 DIAGNOSIS — I517 Cardiomegaly: Secondary | ICD-10-CM | POA: Insufficient documentation

## 2016-12-22 DIAGNOSIS — I340 Nonrheumatic mitral (valve) insufficiency: Secondary | ICD-10-CM | POA: Diagnosis not present

## 2016-12-22 DIAGNOSIS — I471 Supraventricular tachycardia: Secondary | ICD-10-CM | POA: Diagnosis not present

## 2016-12-23 ENCOUNTER — Telehealth: Payer: Self-pay | Admitting: Cardiology

## 2016-12-23 NOTE — Telephone Encounter (Signed)
-----   Message from Quintella Reichertraci R Turner, MD sent at 12/22/2016  4:55 PM EST ----- Echo showed normal LVF with mild MR and mild LAE

## 2016-12-23 NOTE — Telephone Encounter (Signed)
Informed patient of results and verbal understanding expressed.  

## 2016-12-23 NOTE — Telephone Encounter (Signed)
Patient states that she received call from Abby. Patient is requesting a call back at work [phone]. Thanks.

## 2017-01-20 ENCOUNTER — Encounter: Payer: Self-pay | Admitting: Cardiology

## 2017-01-28 ENCOUNTER — Ambulatory Visit (INDEPENDENT_AMBULATORY_CARE_PROVIDER_SITE_OTHER): Payer: BLUE CROSS/BLUE SHIELD | Admitting: Cardiology

## 2017-01-28 ENCOUNTER — Encounter: Payer: Self-pay | Admitting: Cardiology

## 2017-01-28 VITALS — BP 122/80 | HR 90 | Ht 64.25 in | Wt 220.4 lb

## 2017-01-28 DIAGNOSIS — I471 Supraventricular tachycardia: Secondary | ICD-10-CM | POA: Diagnosis not present

## 2017-01-28 NOTE — Patient Instructions (Addendum)
Medication Instructions:  Your physician recommends that you continue on your current medications as directed. Please refer to the Current Medication list given to you today.  If you need a refill on your cardiac medications before your next appointment, please call your pharmacy.   Labwork: None ordered  Testing/Procedures: None ordered  Follow-Up: Your physician wants you to follow-up in: 6 months with Dr. Camnitz.  You will receive a reminder letter in the mail two months in advance. If you don't receive a letter, please call our office to schedule the follow-up appointment.  Thank you for choosing [facility]!!   Joya Willmott, RN [phone]         

## 2017-01-28 NOTE — Progress Notes (Signed)
Electrophysiology Office Note   Date:  01/28/2017   ID:  Madison [name], DOB 06/24/1981, MRN [id number]  PCP:  REDMON,NOELLE, PA-C  Cardiologist:  Mayford Knifeurner Primary Electrophysiologist:  Jiovanni Heeter Jorja LoaMartin Kyrie Bun, MD    Chief Complaint  Patient presents with  . Advice Only    SVT     History of Present Illness: Madison [name] is a 36 y.o. female who presents today for electrophysiology evaluation.   He has a history of hypothyroidism and SVT diagnosed 10 years ago. She presented to the ER on 1/16 2018 with palpitations and was found to be in SVT.  In ER her HR was 170 and spontaneously converted to NSR prior to ER MD seeing her. She had another episode of SVT at 210 bpm on the pulse ox 1 week prior and went to the ER and was given a single dose of Cardizem but was in NSR by the time an EKG could be done.  She tried vagal manuevers at home without any change.   She states that she experienced CP with the palpitations and SOB and these resolved immediately after converting to NSR. She denies any exertional CP or DOE, LE edema or syncope.  She exercises and has no problems with SOB or CP.    Today, she denies symptoms of palpitations, chest pain, shortness of breath, orthopnea, PND, lower extremity edema, claudication, dizziness, presyncope, syncope, bleeding, or neurologic sequela. The patient is tolerating medications without difficulties and is otherwise without complaint today.  He is put on diltiazem by Dr. Mayford Knifeurner, and she says that this seems to his have helped with her resting heart rate. She continues to be able to exercise on this medication without issue. She says that her episodes of palpitations last a few seconds usually, and there is no exacerbating or alleviating factor.   Past Medical History:  Diagnosis Date  . Allergy   . Exercise-induced bronchospasm 06/12/2016  . Herpes simplex disease   . Hypothyroidism   . SVT (supraventricular tachycardia) (HCC) 11/30/2016   Past Surgical  History:  Procedure Laterality Date  . KNEE ARTHROSCOPY Right 2001  . WISDOM TOOTH EXTRACTION Bilateral 1997     Current Outpatient Prescriptions  Medication Sig Dispense Refill  . albuterol (PROVENTIL HFA;VENTOLIN HFA) 108 (90 Base) MCG/ACT inhaler Inhale 2 puffs into the lungs every 6 (six) hours as needed for wheezing or shortness of breath. May use 10-20 prior to activity. 1 Inhaler 2  . Calcium Carbonate (BL CALCIUM PO) Take 1 tablet by mouth daily.    Marland Kitchen. diltiazem (CARDIZEM CD) 120 MG 24 hr capsule Take 1 capsule (120 mg total) by mouth daily. 90 capsule 3  . drospirenone-ethinyl estradiol (YASMIN,ZARAH,SYEDA) 3-0.03 MG tablet Take 1 tablet by mouth daily.     . hydrOXYzine (ATARAX/VISTARIL) 50 MG tablet Take 1-2 tablets by mouth at bedtime.  0  . levothyroxine (SYNTHROID, LEVOTHROID) 100 MCG tablet Take 100 mcg by mouth daily before breakfast.    . loratadine (CLARITIN) 10 MG tablet Take 10 mg by mouth daily.    . valACYclovir (VALTREX) 500 MG tablet Take 500 mg by mouth daily.      No current facility-administered medications for this visit.     Allergies:   Latex; Neosporin [neomycin-bacitracin zn-polymyx]; and Toradol [ketorolac tromethamine]   Social History:  The patient  reports that she has never smoked. She has never used smokeless tobacco. She reports that she drinks alcohol. She reports that she does not use drugs.  Family History:  The patient's family history includes Eczema in her father.    ROS:  Please see the history of present illness.   Otherwise, review of systems is positive for palpitations.   All other systems are reviewed and negative.    PHYSICAL EXAM: VS:  BP 122/80   Pulse 90   Ht 5' 4.25" (1.632 m)   Wt 220 lb 6.4 oz (100 kg)   BMI 37.54 kg/m  , BMI Body mass index is 37.54 kg/m. GEN: Well nourished, well developed, in no acute distress  HEENT: normal  Neck: no JVD, carotid bruits, or masses Cardiac: RRR; no murmurs, rubs, or gallops,no  edema  Respiratory:  clear to auscultation bilaterally, normal work of breathing GI: soft, nontender, nondistended, + BS MS: no deformity or atrophy  Skin: warm and dry Neuro:  Strength and sensation are intact Psych: euthymic mood, full affect  EKG:  EKG is not ordered today. Personal review of the ekg ordered shows sinus rhythm, rate 91  Recent Labs: 11/24/2016: TSH 4.214    Lipid Panel  No results found for: CHOL, TRIG, HDL, CHOLHDL, VLDL, LDLCALC, LDLDIRECT   Wt Readings from Last 3 Encounters:  01/28/17 220 lb 6.4 oz (100 kg)  11/30/16 221 lb 6.4 oz (100.4 kg)  11/12/16 213 lb 12.8 oz (97 kg)      Other studies Reviewed: Additional studies/ records that were reviewed today include: TTE 12/22/16, 30 day monitor 01/12/17  Review of the above records today demonstrates:  - Left ventricle: The cavity size was normal. Wall thickness was   normal. Systolic function was normal. The estimated ejection   fraction was in the range of 55% to 60%. Wall motion was normal;   there were no regional wall motion abnormalities. Left   ventricular diastolic function parameters were normal. - Mitral valve: There was mild regurgitation. - Left atrium: The atrium was mildly dilated.    Normal sinus rhythm with average heart rate 87bpm. The heart rate ranged from 61 to 175bpm.  SVT - likely sinus tachycardia up to 175bpm  Rare PAC and PVC   ASSESSMENT AND PLAN:  1.  Palpitations: On her 30 day monitor, it appears that she had sinus tachycardia as her episode. That being said, she was not symptomatic when she was in sinus tach. It is possible that she had some other form of tachycardia was not found on the monitor. She is currently minimally symptomatic on her diltiazem. We'll continue at the current dose. I have told her that if she has further symptoms that she can take an extra dose of her diltiazem.    Current medicines are reviewed at length with the patient today.   The patient  does not have concerns regarding her medicines.  The following changes were made today:  none  Labs/ tests ordered today include:  No orders of the defined types were placed in this encounter.    Disposition:   FU with Macyn Remmert 6 months  Signed, Kwana Ringel Jorja Loa, MD  01/28/2017 2:21 PM     [facility] [address] [place] [address] [phone] (office) [phone] (fax)

## 2017-03-31 ENCOUNTER — Encounter: Payer: Self-pay | Admitting: Cardiology

## 2017-03-31 ENCOUNTER — Telehealth: Payer: Self-pay

## 2017-03-31 MED ORDER — DILTIAZEM HCL ER COATED BEADS 180 MG PO CP24
180.0000 mg | ORAL_CAPSULE | Freq: Every day | ORAL | 3 refills | Status: DC
Start: 2017-03-31 — End: 2017-04-08

## 2017-03-31 NOTE — Telephone Encounter (Signed)
See MyChart messages below. Per Dr. Mayford Knifeurner, instructed patient to INCREASE DILTIAZEM to 180 mg daily. Follow up scheduled with Dr. Elberta Fortisamnitz June 25 per patient (her work schedule is hectic). Patient agrees with treatment plan and understands to call if she has any further questions or concerns.       Increase Cardizem to 180mg  daily and set up appt with Dr. Elberta Fortisamnitz.    Madison [name]  ----- Message -----  From: Henrietta DineKemp, Davante Gerke A, RN  Sent: 03/31/2017  9:01 AM  To: Quintella Reichertraci R Turner, MD  Subject: FW: Non-Urgent Medical Question               ----- Message -----  From: Sharin GraveFleming, Pamela J, RN  Sent: 03/31/2017  9:00 AM  To: Quintella Reichertraci R Turner, MD, Henrietta DineKathryn A Carlus Stay, RN  Subject: FW: Non-Urgent Medical Question               ----- Message -----  From: Lajoyce CornersEmily R Barba  Sent: 03/31/2017  8:58 AM  To: [facility] Triage  Subject: Non-Urgent Medical Question             I have been on the Diltiazem 120MG  once a day since the SVT episodes in January. On 02/26/17 I had an episode at 12:45AM that did not last more than 5 minutes. This morning at 4:01 I had another one that last about 25 minutes. Both times I was able to convert myself back into rhythm. I also took an additional dose of the medication to help my heart rate stay down. Do I need to increase the dose? Or try another medicine? If you call me today I will be at my desk the number there is [phone], if you call between 12-1 I will be at lunch and you can reach me on my cell at [phone].

## 2017-04-07 ENCOUNTER — Encounter: Payer: Self-pay | Admitting: Cardiology

## 2017-04-07 NOTE — Telephone Encounter (Signed)
Per Dr. Mayford Knifeurner: called pt and offered appt for tomorrow for EKG and to determine if a med change is needed. Pt is agreeable to appointment.

## 2017-04-08 ENCOUNTER — Encounter: Payer: Self-pay | Admitting: Cardiology

## 2017-04-08 ENCOUNTER — Ambulatory Visit (INDEPENDENT_AMBULATORY_CARE_PROVIDER_SITE_OTHER): Payer: BLUE CROSS/BLUE SHIELD | Admitting: Cardiology

## 2017-04-08 VITALS — BP 144/70 | HR 87 | Ht 65.0 in | Wt 221.0 lb

## 2017-04-08 DIAGNOSIS — R002 Palpitations: Secondary | ICD-10-CM

## 2017-04-08 MED ORDER — DILTIAZEM HCL 30 MG PO TABS: 30.0000 mg | ORAL_TABLET | ORAL | 3 refills | Status: DC | PRN

## 2017-04-08 MED ORDER — DILTIAZEM HCL ER COATED BEADS 240 MG PO CP24: 240.0000 mg | ORAL_CAPSULE | Freq: Every day | ORAL | 3 refills | Status: DC

## 2017-04-08 NOTE — Progress Notes (Signed)
04/08/2017 Madison [name]   1981-02-21  [id number]  Primary Physician Milus Heightedmon, Noelle, PA-C Primary Cardiologist: Dr. Mayford Knifeurner  Electrophysiologist: Dr. Elberta Fortisamnitz  Reason for Visit/CC: PSVT  HPI:  Madison [name] is a 36 y.o. female with a history of SVT as well as hypothyroidism, on Synthroid replacement. She is followed by Dr. Mayford Knifeurner. She has also been seen in EP clinic by Dr. Elberta Fortisamnitz for her SVT. She is being treated with rate control therapy with diltiazem. Given a history of asthma, beta blockers are being avoided. She has had a recent 2-D echocardiogram which showed normal systolic function and no structural heart disease. Her thyroid function/ hormone replacement therapy has been followed by her PCP and recent levels were within normal limits.  She presents to clinic today with complaints of recurrent episodes of paroxysmal supraventricular tachycardia. She states that episodes are becoming more frequent. In the past year she has had 6 episodes. Her last episode was 2 nights ago and it woke her up from her sleep around 1 AM. She reports that her heart rate was elevated in the 180-low 200s. She was able to convert with vagal maneuvers and also took an extra tablet of Cardizem CD 180. Episode lasted about 20-30 minutes.  The following day, she reports that she felt very drowsy and weak. This has caused a  great deal of anxiety for her. 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 This showed normal sinus rhythm. She has not had any recent laboratory work to check her electrolytes. She denies any recent fever, chills, nausea, vomiting, diarrhea. No recent viral syndromes. She denies any excess caffeine or alcohol intake. No over-the-counter medications. She is currently a symptomatic in clinic today.   Current Meds  Medication Sig  . diltiazem (CARDIZEM CD) 180 MG 24 hr capsule Take 1 capsule (180 mg total) by mouth daily.  . Drospirenone-Ethinyl Estradiol (YAZ PO) Take  1 tablet by mouth daily.  Marland Kitchen. levothyroxine (SYNTHROID, LEVOTHROID) 100 MCG tablet Take 100 mcg by mouth daily before breakfast.  . loratadine (CLARITIN) 10 MG tablet Take 10 mg by mouth daily.  . valACYclovir (VALTREX) 500 MG tablet Take 500 mg by mouth daily.    Allergies  Allergen Reactions  . Latex Rash  . Neosporin [Neomycin-Bacitracin Zn-Polymyx] Rash  . Toradol [Ketorolac Tromethamine] Rash   Past Medical History:  Diagnosis Date  . Allergy   . Exercise-induced bronchospasm 06/12/2016  . Herpes simplex disease   . Hypothyroidism   . SVT (supraventricular tachycardia) (HCC) 11/30/2016   Family History  Problem Relation Age of Onset  . Eczema Father   . Allergic rhinitis Neg Hx   . Angioedema Neg Hx   . Asthma Neg Hx   . Atopy Neg Hx   . Immunodeficiency Neg Hx   . Urticaria Neg Hx    Past Surgical History:  Procedure Laterality Date  . KNEE ARTHROSCOPY Right 2001  . WISDOM TOOTH EXTRACTION Bilateral 1997   Social History   Social History  . Marital status: Single    Spouse name: N/A  . Number of children: N/A  . Years of education: N/A   Occupational History  . CMA    Social History Main Topics  . Smoking status: Never Smoker  . Smokeless tobacco: Never Used  . Alcohol use Yes  . Drug use: No  . Sexual activity: Not on file   Other Topics Concern  . Not on file   Social History  Narrative   Lives in apartment with carpeting.  No pets.  Electric heat and central cooling.       Review of Systems: General: negative for chills, fever, night sweats or weight changes.  Cardiovascular: negative for chest pain, dyspnea on exertion, edema, orthopnea, palpitations, paroxysmal nocturnal dyspnea or shortness of breath Dermatological: negative for rash Respiratory: negative for cough or wheezing Urologic: negative for hematuria Abdominal: negative for nausea, vomiting, diarrhea, bright red blood per rectum, melena, or hematemesis Neurologic: negative for visual  changes, syncope, or dizziness All other systems reviewed and are otherwise negative except as noted above.   Physical Exam:  Blood pressure (!) 144/70, pulse 87, height 5\' 5"  (1.651 m), weight 221 lb (100.2 kg).  General appearance: alert, cooperative and no distress Neck: no carotid bruit and no JVD Lungs: clear to auscultation bilaterally Heart: regular rate and rhythm, S1, S2 normal, no murmur, click, rub or gallop Extremities: extremities normal, atraumatic, no cyanosis or edema Pulses: 2+ and symmetric Skin: Skin color, texture, turgor normal. No rashes or lesions Neurologic: Grossly normal  EKG NSR -- personally reviewed   ASSESSMENT AND PLAN:   1. PSVT: recurrent issue for her. 6 episodes within the past year. She is avoiding triggers and compliant with her Cardizem. Current resting HR is 87 bpm. BP 144/70. We have elected to further increase her Cardizem from 180 to 240 mg daily for better rate control. I will also prescribe short acting Cardizem, 30 mg PRN, for breakthrough SVT to avoid having to take and extra extended release tablet when needed, as this has resulted in residual fatigue/drowsiness. We will also check electrolytes today. I offered rechecking TSH but she plans to have her PCP do this in several weeks. She has EP f/u with Dr. Elberta Fortis in June. ? Possible SVT ablation if recurrent symptoms despite increasing doses of rate control therapy.     Brittainy Delmer Islam, MHS [facility] 04/08/2017 1:48 PM

## 2017-04-08 NOTE — Patient Instructions (Addendum)
Medication Instructions:   START TAKING CARDIZEM  CD 240 MG ONCE A DAY    START TAKING CARDIZEM  30 MG  ONCE A DAY  AS NEEDED FOR SVT    If you need a refill on your cardiac medications before your next appointment, please call your pharmacy.  Labwork:  BMET AND MAG TODAY     Testing/Procedures: NONE ORDERED  TODAY    Follow-Up:  AS SCHEDULED IN June WITH DR CAMNITZ    Any Other Special Instructions Will Be Listed Below (If Applicable).

## 2017-04-09 LAB — BASIC METABOLIC PANEL
BUN/Creatinine Ratio: 19 (ref 9–23)
BUN: 13 mg/dL (ref 6–20)
CALCIUM: 9.2 mg/dL (ref 8.7–10.2)
CO2: 19 mmol/L (ref 18–29)
CREATININE: 0.69 mg/dL (ref 0.57–1.00)
Chloride: 105 mmol/L (ref 96–106)
GFR calc Af Amer: 130 mL/min/{1.73_m2} (ref >59)
GFR calc non Af Amer: 113 mL/min/{1.73_m2} (ref >59)
GLUCOSE: 92 mg/dL (ref 65–99)
Potassium: 4 mmol/L (ref 3.5–5.2)
Sodium: 141 mmol/L (ref 134–144)

## 2017-04-09 LAB — MAGNESIUM: MAGNESIUM: 1.9 mg/dL (ref 1.6–2.3)

## 2017-04-23 ENCOUNTER — Encounter: Payer: Self-pay | Admitting: Cardiology

## 2017-05-03 ENCOUNTER — Ambulatory Visit (INDEPENDENT_AMBULATORY_CARE_PROVIDER_SITE_OTHER): Payer: BLUE CROSS/BLUE SHIELD | Admitting: Cardiology

## 2017-05-03 ENCOUNTER — Encounter: Payer: Self-pay | Admitting: Cardiology

## 2017-05-03 VITALS — BP 116/80 | HR 77 | Ht 65.0 in | Wt 225.8 lb

## 2017-05-03 DIAGNOSIS — I471 Supraventricular tachycardia: Secondary | ICD-10-CM | POA: Diagnosis not present

## 2017-05-03 MED ORDER — DILTIAZEM HCL ER COATED BEADS 360 MG PO CP24: 360.0000 mg | ORAL_CAPSULE | Freq: Every day | ORAL | 3 refills | Status: DC

## 2017-05-03 NOTE — Patient Instructions (Signed)
Your physician has recommended you make the following change in your medication:  INCREASE DILTIAZEM TO 360 MG EVERY DAY   Your physician wants you to follow-up in: 6 MONTHS WITH DR  Elberta FortisAMNITZ You will receive a reminder letter in the mail two months in advance. If you don't receive a letter, please call our office to schedule the follow-up appointment.

## 2017-05-03 NOTE — Progress Notes (Signed)
Electrophysiology Office Note   Date:  05/03/2017   ID:  Madison [name], DOB 01-23-1981, MRN [id number]  PCP:  Milus Heightedmon, Noelle, PA-C  Cardiologist:  Mayford Knifeurner Primary Electrophysiologist:  Sabrine Patchen Jorja LoaMartin Oniyah Rohe, MD    Chief Complaint  Patient presents with  . Follow-up    SVT     History of Present Illness: Madison [name] is a 36 y.o. female who presents today for electrophysiology evaluation.   He has a history of hypothyroidism and SVT diagnosed 10 years ago. She presented to the ER on 1/16 2018 with palpitations and was found to be in SVT.  In ER her HR was 170 and spontaneously converted to NSR prior to ER MD seeing her. She had another episode of SVT at 210 bpm on the pulse ox 1 week prior and went to the ER and was given a single dose of Cardizem but was in NSR by the time an EKG could be done.  She tried vagal manuevers at home without any change.     Today, denies symptoms of , chest pain, shortness of breath, orthopnea, PND, lower extremity edema, claudication, dizziness, presyncope, syncope, bleeding, or neurologic sequela. The patient is tolerating medications without difficulties and is otherwise without complaint today. She presented to her primary physician's office with palpitations. An EKG was done at the time showed sinus rhythm in the 80s, but she felt better at that time. Since then she has had 5 further episodes of palpitations that are mainly associated with mild shortness of breath. May last for 20 minutes to an hour. She did get 30 mg tabs of diltiazem from her primary physician and she feels like this has helped.    Past Medical History:  Diagnosis Date  . Allergy   . Exercise-induced bronchospasm 06/12/2016  . Herpes simplex disease   . Hypothyroidism   . SVT (supraventricular tachycardia) (HCC) 11/30/2016   Past Surgical History:  Procedure Laterality Date  . KNEE ARTHROSCOPY Right 2001  . WISDOM TOOTH EXTRACTION Bilateral 1997     Current Outpatient  Prescriptions  Medication Sig Dispense Refill  . Calcium Carb-Cholecalciferol (CALCIUM 1000 + D PO) Take 2 capsules by mouth 2 (two) times daily.    Marland Kitchen. diltiazem (CARDIZEM CD) 240 MG 24 hr capsule Take 1 capsule (240 mg total) by mouth daily. 90 capsule 3  . diltiazem (CARDIZEM) 30 MG tablet Take 1 tablet (30 mg total) by mouth as needed (SVT EPISODES). 90 tablet 3  . Drospirenone-Ethinyl Estradiol (YAZ PO) Take 1 tablet by mouth daily.    Marland Kitchen. levothyroxine (SYNTHROID, LEVOTHROID) 100 MCG tablet Take 100 mcg by mouth daily before breakfast.    . loratadine (CLARITIN) 10 MG tablet Take 10 mg by mouth daily.    . Omega-3 Fatty Acids (FISH OIL) 1000 MG CPDR Take 2 capsules by mouth 2 (two) times daily.    . TURMERIC PO Take 1 capsule by mouth daily.    . valACYclovir (VALTREX) 500 MG tablet Take 500 mg by mouth daily.      No current facility-administered medications for this visit.     Allergies:   Latex; Neosporin [neomycin-bacitracin zn-polymyx]; and Toradol [ketorolac tromethamine]   Social History:  The patient  reports that she has never smoked. She has never used smokeless tobacco. She reports that she drinks alcohol. She reports that she does not use drugs.   Family History:  The patient's family history includes Eczema in her father.    ROS:  Please see the  history of present illness.   Otherwise, review of systems is positive for palpitations, easy bruising.   All other systems are reviewed and negative.     PHYSICAL EXAM: VS:  BP 116/80   Pulse 77   Ht 5\' 5"  (1.651 m)   Wt 225 lb 12.8 oz (102.4 kg)   BMI 37.58 kg/m  , BMI Body mass index is 37.58 kg/m. GEN: Well nourished, well developed, in no acute distress  HEENT: normal  Neck: no JVD, carotid bruits, or masses Cardiac: RRR; no murmurs, rubs, or gallops,no edema  Respiratory:  clear to auscultation bilaterally, normal work of breathing GI: soft, nontender, nondistended, + BS MS: no deformity or atrophy  Skin: warm and  dry Neuro:  Strength and sensation are intact Psych: euthymic mood, full affect  EKG:  EKG is not ordered today. Personal review of the ekg from the PCP office ordered 04/07/17 shows sinus rhythm, rate 81  Recent Labs: 11/24/2016: TSH 4.214 04/08/2017: BUN 13; Creatinine, Ser 0.69; Magnesium 1.9; Potassium 4.0; Sodium 141    Lipid Panel  No results found for: CHOL, TRIG, HDL, CHOLHDL, VLDL, LDLCALC, LDLDIRECT   Wt Readings from Last 3 Encounters:  05/03/17 225 lb 12.8 oz (102.4 kg)  04/08/17 221 lb (100.2 kg)  01/28/17 220 lb 6.4 oz (100 kg)      Other studies Reviewed: Additional studies/ records that were reviewed today include: TTE 12/22/16, 30 day monitor 01/12/17  Review of the above records today demonstrates:  - Left ventricle: The cavity size was normal. Wall thickness was   normal. Systolic function was normal. The estimated ejection   fraction was in the range of 55% to 60%. Wall motion was normal;   there were no regional wall motion abnormalities. Left   ventricular diastolic function parameters were normal. - Mitral valve: There was mild regurgitation. - Left atrium: The atrium was mildly dilated.    Normal sinus rhythm with average heart rate 87bpm. The heart rate ranged from 61 to 175bpm.  SVT - likely sinus tachycardia up to 175bpm  Rare PAC and PVC   ASSESSMENT AND PLAN:  1.  Palpitations: Continues to have episodes of palpitations. She did present to her primary physician on May 30, and was found to be in sinus rhythm in the 80s. She says that she has had palpitations 5 times since that visit. Due to her increased episodes, Brownie Nehme plan to increase her diltiazem to 360 mg. It is possible that this is an atrial tachycardia originating from close to the sinus node. It may be amenable to ablation in the future.    Current medicines are reviewed at length with the patient today.   The patient does not have concerns regarding her medicines.  The following  changes were made today:  Increase diltiazem  Labs/ tests ordered today include:  No orders of the defined types were placed in this encounter.    Disposition:   FU with Loyce Flaming 6 months  Signed, Jaquasha Carnevale Jorja Loa, MD  05/03/2017 11:29 AM     [facility] [address] [place] [address] [phone] (office) [phone] (fax)

## 2017-05-06 ENCOUNTER — Encounter: Payer: Self-pay | Admitting: Cardiology

## 2017-07-06 ENCOUNTER — Encounter: Payer: Self-pay | Admitting: Cardiology

## 2017-08-20 ENCOUNTER — Ambulatory Visit: Payer: BLUE CROSS/BLUE SHIELD | Admitting: Allergy

## 2017-08-26 ENCOUNTER — Ambulatory Visit: Payer: Self-pay | Admitting: Allergy

## 2017-08-26 ENCOUNTER — Ambulatory Visit (INDEPENDENT_AMBULATORY_CARE_PROVIDER_SITE_OTHER): Payer: BLUE CROSS/BLUE SHIELD | Admitting: Allergy

## 2017-08-26 ENCOUNTER — Encounter: Payer: Self-pay | Admitting: Allergy

## 2017-08-26 VITALS — BP 122/78 | HR 84 | Resp 18 | Ht 64.5 in | Wt 211.2 lb

## 2017-08-26 DIAGNOSIS — L501 Idiopathic urticaria: Secondary | ICD-10-CM | POA: Diagnosis not present

## 2017-08-26 DIAGNOSIS — J301 Allergic rhinitis due to pollen: Secondary | ICD-10-CM

## 2017-08-26 DIAGNOSIS — J45990 Exercise induced bronchospasm: Secondary | ICD-10-CM | POA: Diagnosis not present

## 2017-08-26 MED ORDER — MONTELUKAST SODIUM 10 MG PO TABS: 10.0000 mg | ORAL_TABLET | Freq: Every day | ORAL | 1 refills | Status: DC

## 2017-08-26 NOTE — Patient Instructions (Signed)
Allergic rhinitis -  Continue avoidance measures to grass, weeds, mold, dust mites and cat.   - Continue Claritin in AM and PM - Use OTC nasal steroid spray (ie. Flonase, Nasacort) 1-2 sprays each nostril daily as needed for nasal congestion or drainage.  Use with proper nasal spray technique. - Discussed Allergen immunotherapy (alltoday and provided with information packet and consent.    Chronic Urticaria (hives) - Hives without concerning features and are intermittent.   - during outbreaks may increase Claritin to 2 tabs twice a day max.   - let us know if hives start to leave any marks or bruising, fevers, joint aches or pains  Exercise-induced bronchospasm - Spirometry today is normal.    - start Singulair 10mg    - have access to albuterol inhaler 2 puffs every 4-6 hours as needed for cough/wheeze/shortness of breath/chest tightness.  May use 15-20 minutes prior to activity.   Monitor frequency of use.     Follow-up 4-6 months or sooner if needed

## 2017-08-26 NOTE — Progress Notes (Signed)
Follow-up Note  RE: Madison [name] MRN: [id number] [name] DOB: 1981-02-04 Date of Office Visit: 08/26/2017   History of present illness: Madison [name] is a 36 y.o. female presenting today for follow-up of allergic rhinitis, urticaria and exercise-induced asthma. She is here today with her boyfriend. She was last seen in the office on 06/10/2016 by myself. In the past year she reports she was diagnosed with SVT and is now on Cardizem for rate control. Otherwise she has not had any other new diagnoses, surgeries or hospitalizations. With her exercise-induced asthma she reports that the albuterol does not seem to be helping to relieve her symptoms when she uses it prior to exercise. She states she has not tried to use her albuterol after exercise to relieve her symptoms. She otherwise denies any significant daytime symptoms or nighttime awakenings. She has needed no oral steroids, or urgent care immediately visits. With her allergies she is taking Claritin twice a day as well as using Flonase daily. She still reports having some symptoms of nasal congestion and drainage. She is unable to use Zyrtec as it makes her sleepy and Allegra she says makes her too dry. She is still considering allergen immunotherapy. She still reports some episodes of hives however they are very infrequent. She does avoid strawberry and grapes as they make her itchy.     Review of systems: Review of Systems  Constitutional: Negative for chills, fever and malaise/fatigue.  HENT: Positive for congestion. Negative for ear discharge, ear pain, nosebleeds, sinus pain, sore throat and tinnitus.   Eyes: Negative for pain, discharge and redness.  Respiratory: Positive for cough and shortness of breath. Negative for sputum production and wheezing.   Cardiovascular: Negative for chest pain.  Gastrointestinal: Negative for abdominal pain, constipation, diarrhea, heartburn, nausea and vomiting.  Musculoskeletal: Negative for joint pain.    Skin: Negative for itching and rash.  Neurological: Negative for headaches.    All other systems negative unless noted above in HPI  Past medical/social/surgical/family history have been reviewed and are unchanged unless specifically indicated below.  No changes  Medication List: Allergies as of 08/26/2017      Reactions   Latex Rash   Neosporin [neomycin-bacitracin Zn-polymyx] Rash   Toradol [ketorolac Tromethamine] Rash      Medication List       Accurate as of 08/26/17  2:19 PM. Always use your most recent med list.          albuterol 108 (90 Base) MCG/ACT inhaler Commonly known as:  PROVENTIL HFA;VENTOLIN HFA Inhale into the lungs every 6 (six) hours as needed for wheezing or shortness of breath.   CALCIUM 1000 + D PO Take 2 capsules by mouth 2 (two) times daily.   diltiazem 30 MG tablet Commonly known as:  CARDIZEM Take 1 tablet (30 mg total) by mouth as needed (SVT EPISODES).   diltiazem 360 MG 24 hr capsule Commonly known as:  CARDIZEM CD Take 1 capsule (360 mg total) by mouth daily.   Fish Oil 1000 MG Cpdr Take 2 capsules by mouth 2 (two) times daily.   levothyroxine 100 MCG tablet Commonly known as:  SYNTHROID, LEVOTHROID Take 100 mcg by mouth daily before breakfast.   loratadine 10 MG tablet Commonly known as:  CLARITIN Take 10 mg by mouth daily.   TURMERIC PO Take 1 capsule by mouth daily.   valACYclovir 500 MG tablet Commonly known as:  VALTREX Take 500 mg by mouth daily.   YAZ PO Take 1  tablet by mouth daily.   NIKKI 3-0.02 MG tablet Generic drug:  drospirenone-ethinyl estradiol Take 1 tablet by mouth daily.       Known medication allergies: Allergies  Allergen Reactions  . Latex Rash  . Neosporin [Neomycin-Bacitracin Zn-Polymyx] Rash  . Toradol [Ketorolac Tromethamine] Rash     Physical examination: Blood pressure 122/78, pulse 84, resp. rate 18, height 5' 4.5" (1.638 m), weight 211 lb 3.2 oz (95.8 kg), SpO2 97  %.  General: Alert, interactive, in no acute distress. HEENT: PERRLA, TMs pearly gray, turbinates mildly edematous without discharge, post-pharynx non erythematous. Neck: Supple without lymphadenopathy. Lungs: Clear to auscultation without wheezing, rhonchi or rales. {no increased work of breathing. CV: Normal S1, S2 without murmurs. Abdomen: Nondistended, nontender. Skin: Warm and dry, without lesions or rashes. Extremities:  No clubbing, cyanosis or edema. Neuro:   Grossly intact.  Diagnositics/Labs:  Spirometry: FEV1: 2.7L  84%, FVC: 3.2L  84%, ratio consistent with nonobstructive pattern  Assessment and plan:    Allergic rhinitis -  Continue avoidance measures to grass, weeds, mold, dust mites and cat.   - Continue Claritin in AM and PM - Use OTC nasal steroid spray (ie. Flonase, Nasacort) 1-2 sprays each nostril daily as needed for nasal congestion or drainage.  Use with proper nasal spray technique. - Discussed Allergen immunotherapy (alltoday and provided with information packet and consent.    Chronic Urticaria (hives) - Hives without concerning features and are intermittent.   - during outbreaks may increase Claritin to 2 tabs twice a day max.   - let us know if hives start to leave any marks or bruising, fevers, joint aches or pains  Exercise-induced bronchospasm - Spirometry today is normal.    - start Singulair 10mg    - have access to albuterol inhaler 2 puffs every 4-6 hours as needed for cough/wheeze/shortness of breath/chest tightness.  May use 15-20 minutes prior to activity.   Monitor frequency of use.    Follow-up 4-6 months or sooner if needed  I appreciate the opportunity to take part in Madison [name]'s care. Please do not hesitate to contact me with questions.  Sincerely,   Margo Aye, MD Allergy/Immunology [facility]

## 2017-08-27 NOTE — Addendum Note (Signed)
Addended by: Dub MikesHICKS, ASHLEY N on: 08/27/2017 08:34 AM   Modules accepted: Orders

## 2017-08-27 NOTE — Addendum Note (Signed)
Addended by: Dub MikesHICKS, Jaiden Dinkins N on: 08/27/2017 09:02 AM   Modules accepted: Orders

## 2017-09-01 ENCOUNTER — Other Ambulatory Visit: Payer: Self-pay | Admitting: Allergy

## 2017-09-01 DIAGNOSIS — J45990 Exercise induced bronchospasm: Secondary | ICD-10-CM

## 2017-09-17 ENCOUNTER — Encounter: Payer: Self-pay | Admitting: Cardiology

## 2017-10-28 ENCOUNTER — Encounter: Payer: Self-pay | Admitting: Cardiology

## 2017-10-28 ENCOUNTER — Ambulatory Visit: Payer: BLUE CROSS/BLUE SHIELD | Admitting: Cardiology

## 2017-10-28 VITALS — BP 118/80 | HR 86 | Ht 64.0 in | Wt 214.0 lb

## 2017-10-28 DIAGNOSIS — I471 Supraventricular tachycardia: Secondary | ICD-10-CM

## 2017-10-28 NOTE — Progress Notes (Signed)
Electrophysiology Office Note   Date:  10/28/2017   ID:  Madison [name], DOB 1981/03/12, MRN [id number]  PCP:  Milus Heightedmon, Noelle, PA-C  Cardiologist:  Mayford Knifeurner Primary Electrophysiologist:  Pancho Rushing Jorja LoaMartin Madylyn Insco, MD    Chief Complaint  Patient presents with  . Follow-up    SVT     History of Present Illness: Madison [name] is a 36 y.o. female who presents today for electrophysiology evaluation.   He has a history of hypothyroidism and SVT diagnosed 10 years ago. She presented to the ER on 1/16 2018 with palpitations and was found to be in SVT.  In ER her HR was 170 and spontaneously converted to NSR prior to ER MD seeing her. She had another episode of SVT at 210 bpm on the pulse ox 1 week prior and went to the ER and was given a single dose of Cardizem but was in NSR by the time an EKG could be done.  She tried vagal manuevers at home without any change.     Today, denies symptoms of palpitations, chest pain, shortness of breath, orthopnea, PND, lower extremity edema, claudication, dizziness, presyncope, syncope, bleeding, or neurologic sequela. The patient is tolerating medications without difficulties.  Her diltiazem was increased at her last visit.  Since then she has noted no further episodes of SVT.  She is tolerating the medication well not having any issues.    Past Medical History:  Diagnosis Date  . Allergy   . Exercise-induced bronchospasm 06/12/2016  . Herpes simplex disease   . Hypothyroidism   . SVT (supraventricular tachycardia) (HCC) 11/30/2016   Past Surgical History:  Procedure Laterality Date  . KNEE ARTHROSCOPY Right 2001  . WISDOM TOOTH EXTRACTION Bilateral 1997     Current Outpatient Medications  Medication Sig Dispense Refill  . albuterol (PROVENTIL HFA;VENTOLIN HFA) 108 (90 Base) MCG/ACT inhaler INHALE 2 PUFFS BY MOUTH EVERY 6 HOURS AS NEEDED FOR WHEEZING OR SHORTNESS OF BREATH 18 g 1  . Calcium Carb-Cholecalciferol (CALCIUM 1000 + D PO) Take 2 capsules by  mouth 2 (two) times daily.    Marland Kitchen. diltiazem (CARDIZEM CD) 360 MG 24 hr capsule Take 1 capsule (360 mg total) by mouth daily. 90 capsule 3  . diltiazem (CARDIZEM) 30 MG tablet Take 1 tablet (30 mg total) by mouth as needed (SVT EPISODES). 90 tablet 3  . levothyroxine (SYNTHROID, LEVOTHROID) 100 MCG tablet Take 100 mcg by mouth daily before breakfast.    . loratadine (CLARITIN) 10 MG tablet Take 10 mg by mouth daily.    . montelukast (SINGULAIR) 10 MG tablet Take 1 tablet (10 mg total) by mouth at bedtime. 90 tablet 1  . NIKKI 3-0.02 MG tablet Take 1 tablet by mouth daily.  3  . Omega-3 Fatty Acids (FISH OIL) 1000 MG CPDR Take 2 capsules by mouth 2 (two) times daily.    . valACYclovir (VALTREX) 500 MG tablet Take 500 mg by mouth daily.      No current facility-administered medications for this visit.     Allergies:   Latex; Neosporin [neomycin-bacitracin zn-polymyx]; and Toradol [ketorolac tromethamine]   Social History:  The patient  reports that  has never smoked. she has never used smokeless tobacco. She reports that she drinks alcohol. She reports that she does not use drugs.   Family History:  The patient's family history includes Eczema in her father.   ROS:  Please see the history of present illness.   Otherwise, review of systems is positive for  none.   All other systems are reviewed and negative.   PHYSICAL EXAM: VS:  BP 118/80   Pulse 86   Ht 5\' 4"  (1.626 m)   Wt 214 lb (97.1 kg)   SpO2 98%   BMI 36.73 kg/m  , BMI Body mass index is 36.73 kg/m. GEN: Well nourished, well developed, in no acute distress  HEENT: normal  Neck: no JVD, carotid bruits, or masses Cardiac: RRR; no murmurs, rubs, or gallops,no edema  Respiratory:  clear to auscultation bilaterally, normal work of breathing GI: soft, nontender, nondistended, + BS MS: no deformity or atrophy  Skin: warm and dry Neuro:  Strength and sensation are intact Psych: euthymic mood, full affect  EKG:  EKG is not ordered  today. Personal review of the ekg ordered 04/07/17 shows sinus rhythm, rate 81   Recent Labs: 11/24/2016: TSH 4.214 04/08/2017: BUN 13; Creatinine, Ser 0.69; Magnesium 1.9; Potassium 4.0; Sodium 141    Lipid Panel  No results found for: CHOL, TRIG, HDL, CHOLHDL, VLDL, LDLCALC, LDLDIRECT   Wt Readings from Last 3 Encounters:  10/28/17 214 lb (97.1 kg)  08/26/17 211 lb 3.2 oz (95.8 kg)  05/03/17 225 lb 12.8 oz (102.4 kg)      Other studies Reviewed: Additional studies/ records that were reviewed today include: TTE 12/22/16, 30 day monitor 01/12/17  Review of the above records today demonstrates:  - Left ventricle: The cavity size was normal. Wall thickness was   normal. Systolic function was normal. The estimated ejection   fraction was in the range of 55% to 60%. Wall motion was normal;   there were no regional wall motion abnormalities. Left   ventricular diastolic function parameters were normal. - Mitral valve: There was mild regurgitation. - Left atrium: The atrium was mildly dilated.    Normal sinus rhythm with average heart rate 87bpm. The heart rate ranged from 61 to 175bpm.  SVT - likely sinus tachycardia up to 175bpm  Rare PAC and PVC   ASSESSMENT AND PLAN:  1.  SVT: Diltiazem was increased at the last visit.  Since the increase, she is felt much better without major complaints.  She has had no further episodes of SVT.  We Judah Carchi continue this medication and make no further changes at this time.    Current medicines are reviewed at length with the patient today.   The patient does not have concerns regarding her medicines.  The following changes were made today: None  Labs/ tests ordered today include:  No orders of the defined types were placed in this encounter.    Disposition:   FU with Glendel Jaggers 12 months  Signed, Dominque Marlin Jorja LoaMartin Hollyanne Schloesser, MD  10/28/2017 4:11 PM     [facility] [address] [place] [address] [phone]  (office) [phone] (fax)

## 2017-10-28 NOTE — Patient Instructions (Addendum)
Medication Instructions:  Your physician recommends that you continue on your current medications as directed. Please refer to the Current Medication list given to you today.  * If you need a refill on your cardiac medications before your next appointment, please call your pharmacy.   Labwork: None ordered  Testing/Procedures: None ordered  Follow-Up: Your physician wants you to follow-up in: 1 year with Dr. Camnitz.  You will receive a reminder letter in the mail two months in advance. If you don't receive a letter, please call our office to schedule the follow-up appointment.  Thank you for choosing [facility]!!   Sherri Price, RN [phone]        

## 2017-11-19 ENCOUNTER — Ambulatory Visit
Admission: RE | Admit: 2017-11-19 | Discharge: 2017-11-19 | Disposition: A | Discharge status: Home / Self Care | Payer: BLUE CROSS/BLUE SHIELD | Source: Ambulatory Visit | Attending: Physician Assistant | Admitting: Physician Assistant

## 2017-11-19 ENCOUNTER — Other Ambulatory Visit: Payer: Self-pay | Admitting: Physician Assistant

## 2017-11-19 DIAGNOSIS — R0789 Other chest pain: Secondary | ICD-10-CM

## 2017-12-21 ENCOUNTER — Encounter: Payer: Self-pay | Admitting: Cardiology

## 2017-12-22 ENCOUNTER — Other Ambulatory Visit: Payer: Self-pay | Admitting: *Deleted

## 2017-12-22 MED ORDER — DILTIAZEM HCL ER COATED BEADS 360 MG PO CP24: 360.0000 mg | ORAL_CAPSULE | Freq: Every day | ORAL | 3 refills | Status: DC

## 2017-12-22 MED FILL — DILTIAZEM HCL ER 360 MG CAP: 360 | 90 days supply | Qty: 90 | Fill #0

## 2018-01-20 MED FILL — LEVOTHYROXINE 100 MCG TABLE: 100 | 90 days supply | Qty: 90 | Fill #0

## 2018-01-24 MED FILL — TRI FEMYNOR 28 TABLET: 0.18/0.215/ | 84 days supply | Qty: 84 | Fill #0

## 2018-02-09 ENCOUNTER — Other Ambulatory Visit (HOSPITAL_COMMUNITY)
Admission: RE | Admit: 2018-02-09 | Discharge: 2018-02-09 | Disposition: A | Discharge status: Home / Self Care | Payer: No Typology Code available for payment source | Source: Ambulatory Visit | Attending: Family Medicine | Admitting: Family Medicine

## 2018-02-09 ENCOUNTER — Other Ambulatory Visit: Payer: Self-pay | Admitting: Physician Assistant

## 2018-02-09 DIAGNOSIS — Z01419 Encounter for gynecological examination (general) (routine) without abnormal findings: Secondary | ICD-10-CM | POA: Insufficient documentation

## 2018-02-09 MED FILL — hydrOXYzine HCL 50 MG TABS: 50 | 11 days supply | Qty: 90 | Fill #0

## 2018-02-10 MED FILL — LIVALO 2 MG TABLET: 2 | 30 days supply | Qty: 30 | Fill #0

## 2018-02-11 LAB — CYTOLOGY - PAP: Diagnosis: NEGATIVE

## 2018-02-16 ENCOUNTER — Encounter (HOSPITAL_COMMUNITY): Payer: Self-pay | Admitting: Emergency Medicine

## 2018-02-16 ENCOUNTER — Ambulatory Visit (HOSPITAL_COMMUNITY)
Admission: EM | Admit: 2018-02-16 | Discharge: 2018-02-16 | Disposition: A | Discharge status: Home / Self Care | Payer: No Typology Code available for payment source

## 2018-02-16 DIAGNOSIS — M79605 Pain in left leg: Secondary | ICD-10-CM

## 2018-02-16 DIAGNOSIS — M79672 Pain in left foot: Secondary | ICD-10-CM

## 2018-02-16 DIAGNOSIS — M79671 Pain in right foot: Secondary | ICD-10-CM

## 2018-02-16 MED ORDER — LIDOCAINE HCL 2 % IJ SOLN
INTRAMUSCULAR | Status: AC
  Filled 2018-02-16: qty 20

## 2018-02-16 MED ORDER — CELECOXIB 100 MG PO CAPS: 100.0000 mg | ORAL_CAPSULE | Freq: Two times a day (BID) | ORAL | 0 refills | Status: DC

## 2018-02-16 NOTE — ED Triage Notes (Signed)
Pt states "about a week ago I noticed my ankles were swollen after being on my feet, I bought some inserts for my shoes, the beginning of this week the swelling is still there, even with elevation and compression" Pain only on her L leg and travels up to her L hip.

## 2018-02-16 NOTE — Discharge Instructions (Addendum)
For meat, choose lean proteins. Wild caught fish, chicken breast, lean red meat. Avoid salt and in general restaurant food unless its heavy salad based.   Salads - kale, spinach, cabbage, spring mix; use seeds like pumpkin seeds or sunflower seeds; you can also use 1-2 hard boiled eggs. Fruits - avocadoes, berries (blueberries, raspberries, blackberries), apples, oranges, pomegranate, grapefruit Seeds - quinoa, chia seeds, flax seeds; you can also incorporate oatmeal Vegetables - aspargus, cauliflower, broccoli, green beans, brussel spouts, bell peppers; stay away from starchy vegetables like potatoes, carrots, peas   Brussel sprouts - Cut off stems. Place in a mixing bowl that has a lid. Pour in a 1/4-1/2 cup olive oil, spices, use a light amount of parmesan. Place on a baking sheet. Bake for 10 minutes at 400F. Take it out, eat the brussel chips. Place for another 5-10 minutes.   Mashed cauliflower - Boil a bunch of cauliflower in a pot of water. Blend in a food processor with 1-2 tablespoons of butter.  Spaghetti squash -  Cut the squash in half very carefully, clean out seeds from the middle. Place 1/2 face down in a microwave safe dish with at least 2 inches of water. Make 4-6 slits on outside of spaghetti squash and microwave for 10-12 minutes. Take out the spaghetti using a metal spoon. Repeat for the other half.    Vega protein is good protein powder, make sure you use ~6 ice cubes to give it smoothie consistency together with ~4-6 ounces of vanilla soy milk. Throw cinnamon into your shake, use peanut butter. You can also use the fruits that I listed above. Throw spinach or kale into the shake.

## 2018-02-16 NOTE — ED Provider Notes (Signed)
MRN: [id number] DOB: September 02, 1981  Subjective:   Madison [name] is a 37 y.o. female presenting for 1 week history of lower leg swelling, left leg pain over lateral aspect.  Her swelling is worse at the end of the day but is better by the time she wakes up.  The patient reports that she has increased her running to about 2 miles 5 days a week.  She has been doing this in an effort for weight loss over the past 2 months and was not running all that much before then.  She is also working a new job where she stands for 10-hour shifts 5 days a week.  In the past week she has tried ibuprofen with minimal relief.  Denies falls, trauma, swelling, calf pain, redness, erythema.  She has tried to buy new shoes and has used shoe inserts.  Denies smoking cigarettes.  No current facility-administered medications for this encounter.   Current Outpatient Medications:  .  diltiazem (CARDIZEM CD) 360 MG 24 hr capsule, Take 1 capsule (360 mg total) by mouth daily., Disp: 90 capsule, Rfl: 3 .  diltiazem (CARDIZEM) 30 MG tablet, Take 1 tablet (30 mg total) by mouth as needed (SVT EPISODES)., Disp: 90 tablet, Rfl: 3 .  levothyroxine (SYNTHROID, LEVOTHROID) 100 MCG tablet, Take 100 mcg by mouth daily before breakfast., Disp: , Rfl:  .  loratadine (CLARITIN) 10 MG tablet, Take 10 mg by mouth daily., Disp: , Rfl:  .  norgestimate-ethinyl estradiol (ORTHO-CYCLEN,SPRINTEC,PREVIFEM) 0.25-35 MG-MCG tablet, Take 1 tablet by mouth daily., Disp: , Rfl:  .  Pitavastatin Calcium (LIVALO PO), Take by mouth., Disp: , Rfl:  .  valACYclovir (VALTREX) 500 MG tablet, Take 500 mg by mouth daily. , Disp: , Rfl:  .  albuterol (PROVENTIL HFA;VENTOLIN HFA) 108 (90 Base) MCG/ACT inhaler, INHALE 2 PUFFS BY MOUTH EVERY 6 HOURS AS NEEDED FOR WHEEZING OR SHORTNESS OF BREATH (Patient not taking: Reported on 02/16/2018), Disp: 18 g, Rfl: 1 .  Calcium Carb-Cholecalciferol (CALCIUM 1000 + D PO), Take 2 capsules by mouth 2 (two) times daily., Disp: , Rfl:   .  montelukast (SINGULAIR) 10 MG tablet, Take 1 tablet (10 mg total) by mouth at bedtime. (Patient not taking: Reported on 02/16/2018), Disp: 90 tablet, Rfl: 1 .  NIKKI 3-0.02 MG tablet, Take 1 tablet by mouth daily., Disp: , Rfl: 3 .  Omega-3 Fatty Acids (FISH OIL) 1000 MG CPDR, Take 2 capsules by mouth 2 (two) times daily., Disp: , Rfl:    Allergies  Allergen Reactions  . Latex Rash  . Neosporin [Neomycin-Bacitracin Zn-Polymyx] Rash  . Toradol [Ketorolac Tromethamine] Rash    Past Medical History:  Diagnosis Date  . Allergy   . Exercise-induced bronchospasm 06/12/2016  . Herpes simplex disease   . Hypothyroidism   . SVT (supraventricular tachycardia) (HCC) 11/30/2016     Past Surgical History:  Procedure Laterality Date  . KNEE ARTHROSCOPY Right 2001  . WISDOM TOOTH EXTRACTION Bilateral 1997    Objective:   Vitals: BP 123/75   Pulse 75   Temp 98.5 F (36.9 C)   Resp 18   LMP 01/26/2018   SpO2 100%   Wt Readings from Last 3 Encounters:  10/28/17 214 lb (97.1 kg)  08/26/17 211 lb 3.2 oz (95.8 kg)  05/03/17 225 lb 12.8 oz (102.4 kg)   Currently reports her weight is 219lbs and is 5'5.   Physical Exam  Constitutional: She is oriented to person, place, and time. She appears well-developed and well-nourished.  Cardiovascular: Normal rate.  Pulmonary/Chest: Effort normal.  Musculoskeletal:  There is tenderness all along aspect of left leg extending from lateral aspect of foot all the way up to the left hip.  Negative Homans sign.  There is no tenderness over plantar fascia.  Neurological: She is alert and oriented to person, place, and time.    Assessment and Plan :   Left leg pain  Bilateral foot pain  We will manage conservatively with Celebrex, modification of her exercise activity including using the bicycle, elliptical, pool instead of the treadmill.  Reviewed diet for dietary modifications.  Follow-up as needed.  Consider work restrictions if symptoms  persist.   Wallis Bamberg, [place] 02/16/18 1922

## 2018-02-24 ENCOUNTER — Ambulatory Visit: Payer: BLUE CROSS/BLUE SHIELD | Admitting: Allergy

## 2018-03-22 MED FILL — DILTIAZEM HCL ER 360 MG CAP: 360 | 90 days supply | Qty: 90 | Fill #1

## 2018-03-22 MED FILL — LIVALO 2 MG TABLET: 2 | 30 days supply | Qty: 30 | Fill #1

## 2018-04-12 ENCOUNTER — Telehealth: Payer: No Typology Code available for payment source | Admitting: Family

## 2018-04-12 DIAGNOSIS — L2389 Allergic contact dermatitis due to other agents: Secondary | ICD-10-CM

## 2018-04-12 MED ORDER — PREDNISONE 10 MG (21) PO TBPK: ORAL_TABLET | ORAL | 0 refills | Status: DC

## 2018-04-12 MED FILL — predniSONE 10 MG TABS: 10 | 6 days supply | Qty: 21 | Fill #0

## 2018-04-12 NOTE — Progress Notes (Signed)
E Visit for Rash  We are sorry that you are not feeling well. Here is how we plan to help!  Based on what you shared with me it looks like you have contact dermatitis.  Contact dermatitis is a skin rash caused by something that touches the skin and causes irritation or inflammation.  Your skin may be red, swollen, dry, cracked, and itch.  The rash should go away in a few days but can last a few weeks.  If you get a rash, it's important to figure out what caused it so the irritant can be avoided in the future.   Prednisone 10 mg daily for 6 days (see taper instructions below)    HOME CARE:   Take cool showers and avoid direct sunlight.  Apply cool compress or wet dressings.  Take a bath in an oatmeal bath.  Sprinkle content of one Aveeno packet under running faucet with comfortably warm water.  Bathe for 15-20 minutes, 1-2 times daily.  Pat dry with a towel. Do not rub the rash.  Use hydrocortisone cream.  Take an antihistamine like Benadryl for widespread rashes that itch.  The adult dose of Benadryl is 25-50 mg by mouth 4 times daily.  Caution:  This type of medication may cause sleepiness.  Do not drink alcohol, drive, or operate dangerous machinery while taking antihistamines.  Do not take these medications if you have prostate enlargement.  Read package instructions thoroughly on all medications that you take.  GET HELP RIGHT AWAY IF:   Symptoms don't go away after treatment.  Severe itching that persists.  If you rash spreads or swells.  If you rash begins to smell.  If it blisters and opens or develops a yellow-brown crust.  You develop a fever.  You have a sore throat.  You become short of breath.  MAKE SURE YOU:  Understand these instructions. Will watch your condition. Will get help right away if you are not doing well or get worse.  Thank you for choosing an e-visit. Your e-visit answers were reviewed by a board certified advanced clinical practitioner to  complete your personal care plan. Depending upon the condition, your plan could have included both over the counter or prescription medications. Please review your pharmacy choice. Be sure that the pharmacy you have chosen is open so that you can pick up your prescription now.  If there is a problem you may message your provider in MyChart to have the prescription routed to another pharmacy. Your safety is important to us. If you have drug allergies check your prescription carefully.  For the next 24 hours, you can use MyChart to ask questions about today's visit, request a non-urgent call back, or ask for a work or school excuse from your e-visit provider. You will get an email in the next two days asking about your experience. I hope that your e-visit has been valuable and will speed your recovery.     

## 2018-04-20 MED FILL — LIVALO 2 MG TABLET: 2 | 30 days supply | Qty: 30 | Fill #2

## 2018-04-20 MED FILL — LEVOTHYROXINE 100 MCG TABLE: 100 | 90 days supply | Qty: 90 | Fill #0

## 2018-04-20 MED FILL — TRI FEMYNOR 28 TABLET: 0.18/0.215/ | 84 days supply | Qty: 84 | Fill #0

## 2018-04-27 ENCOUNTER — Ambulatory Visit (INDEPENDENT_AMBULATORY_CARE_PROVIDER_SITE_OTHER): Payer: Self-pay | Admitting: Nurse Practitioner

## 2018-04-27 VITALS — BP 130/75 | HR 76 | Temp 97.9°F | Resp 16 | Wt 228.4 lb

## 2018-04-27 DIAGNOSIS — J0140 Acute pansinusitis, unspecified: Secondary | ICD-10-CM

## 2018-04-27 MED ORDER — AMOXICILLIN-POT CLAVULANATE 875-125 MG PO TABS: 1.0000 | ORAL_TABLET | Freq: Two times a day (BID) | ORAL | 0 refills | Status: AC

## 2018-04-27 MED ORDER — FLUCONAZOLE 150 MG PO TABS: 150.0000 mg | ORAL_TABLET | Freq: Once | ORAL | 0 refills | Status: AC

## 2018-04-27 MED ORDER — FLUTICASONE PROPIONATE 50 MCG/ACT NA SUSP: 2.0000 | Freq: Every day | NASAL | 0 refills | Status: DC

## 2018-04-27 NOTE — Progress Notes (Signed)
Subjective:  Madison [name] is a 37 y.o. female who presents for evaluation of possible sinusitis.  Symptoms include left ear pressure/pain, facial pain, headache described as dull, aching, nasal congestion and post nasal drip.  Onset of symptoms was 8 days ago, and has been gradually worsening since that time.  Treatment to date:  antihistamines and nasal steroids.  High risk factors for influenza complications:  co-morbid illness.  The following portions of the patient's history were reviewed and updated as appropriate:  allergies, current medications and past medical history.  Constitutional: positive for fatigue, negative for anorexia, chills, fevers, malaise and sweats Eyes: negative Ears, nose, mouth, throat, and face: positive for nasal congestion and left ear pressure/fullness, negative for sore throat Respiratory: negative Cardiovascular: negative Gastrointestinal: negative Neurological: positive for headaches, negative for coordination problems, dizziness, paresthesia, seizures, speech problems, tremors, vertigo and weakness Allergic/Immunologic: positive for hay fever Objective:  BP 130/75 (BP Location: Right Arm, Patient Position: Sitting, Cuff Size: Normal)   Pulse 76   Temp 97.9 F (36.6 C) (Oral)   Resp 16   Wt 228 lb 6.4 oz (103.6 kg)   SpO2 97%   BMI 39.20 kg/m  General appearance: alert, cooperative, fatigued and no distress Head: Normocephalic, without obvious abnormality, atraumatic Eyes: conjunctivae/corneas clear. PERRL, EOM's intact. Fundi benign. Ears: abnormal TM left ear - mucoid middle ear fluid Nose: no discharge, turbinates swollen, inflamed, moderate maxillary sinus tenderness left, moderate frontal sinus tenderness left Throat: lips, mucosa, and tongue normal; teeth and gums normal Lungs: clear to auscultation bilaterally Heart: regular rate and rhythm, S1, S2 normal, no murmur, click, rub or gallop Abdomen: soft, non-tender; bowel sounds normal; no  masses,  no organomegaly Pulses: 2+ and symmetric Skin: Skin color, texture, turgor normal. No rashes or lesions Lymph nodes: cervical and submandibular nodes normal Neurologic: Grossly normal    Assessment:  Acute Pansinusitis    Plan:  Discussed diagnosis and treatment of sinusitis. Educational material distributed and questions answered. Suggested symptomatic OTC remedies. Supportive care with appropriate antipyretics and fluids. Nasal saline spray for congestion. Augmentin and Flonase per orders.  Patient also given a prescription for Diflucan for yeast infection coverage.   per orders. Nasal steroids per orders. Follow up as needed.  Patient verbalizes understanding and has no questions at time of discharge. Meds ordered this encounter  Medications  . amoxicillin-clavulanate (AUGMENTIN) 875-125 MG tablet    Sig: Take 1 tablet by mouth 2 (two) times daily for 10 days.    Dispense:  20 tablet    Refill:  0  . fluconazole (DIFLUCAN) 150 MG tablet    Sig: Take 1 tablet (150 mg total) by mouth once for 1 dose. May repeat in 72 hours up to 2 doses.    Dispense:  3 tablet    Refill:  0  . fluticasone (FLONASE) 50 MCG/ACT nasal spray    Sig: Place 2 sprays into both nostrils daily for 10 days.    Dispense:  16 g    Refill:  0

## 2018-04-27 NOTE — Patient Instructions (Signed)

## 2018-05-23 MED FILL — hydrOXYzine HCL 50 MG TABS: 50 | 13 days supply | Qty: 90 | Fill #1

## 2018-06-08 ENCOUNTER — Encounter: Payer: Self-pay | Admitting: Cardiology

## 2018-06-17 ENCOUNTER — Telehealth: Payer: BLUE CROSS/BLUE SHIELD | Admitting: Family

## 2018-06-17 DIAGNOSIS — L2389 Allergic contact dermatitis due to other agents: Secondary | ICD-10-CM

## 2018-06-17 MED ORDER — PREDNISONE 10 MG (21) PO TBPK: ORAL_TABLET | ORAL | 0 refills | Status: DC

## 2018-06-17 NOTE — Progress Notes (Signed)
Thank you for the details you included in the comment boxes. Those details are very helpful in determining the best course of treatment for you and help [place] to provide the best care.  E Visit for Rash  We are sorry that you are not feeling well. Here is how we plan to help!  Based on what you shared with me it looks like you have contact dermatitis.  Contact dermatitis is a skin rash caused by something that touches the skin and causes irritation or inflammation.  Your skin may be red, swollen, dry, cracked, and itch.  The rash should go away in a few days but can last a few weeks.  If you get a rash, it's important to figure out what caused it so the irritant can be avoided in the future.   Prednisone 10 mg daily for 6 days (see taper instructions below)  Directions for 6 day taper: Day 1: 2 tablets before breakfast, 1 after both lunch & dinner and 2 at bedtime Day 2: 1 tab before breakfast, 1 after both lunch & dinner and 2 at bedtime Day 3: 1 tab at each meal & 1 at bedtime Day 4: 1 tab at breakfast, 1 at lunch, 1 at bedtime Day 5: 1 tab at breakfast & 1 tab at bedtime Day 6: 1 tab at breakfast    HOME CARE:   Take cool showers and avoid direct sunlight.  Apply cool compress or wet dressings.  Take a bath in an oatmeal bath.  Sprinkle content of one Aveeno packet under running faucet with comfortably warm water.  Bathe for 15-20 minutes, 1-2 times daily.  Pat dry with a towel. Do not rub the rash.  Use hydrocortisone cream.  Take an antihistamine like Benadryl for widespread rashes that itch.  The adult dose of Benadryl is 25-50 mg by mouth 4 times daily.  Caution:  This type of medication may cause sleepiness.  Do not drink alcohol, drive, or operate dangerous machinery while taking antihistamines.  Do not take these medications if you have prostate enlargement.  Read package instructions thoroughly on all medications that you take.  GET HELP RIGHT AWAY IF:   Symptoms don't  go away after treatment.  Severe itching that persists.  If you rash spreads or swells.  If you rash begins to smell.  If it blisters and opens or develops a yellow-brown crust.  You develop a fever.  You have a sore throat.  You become short of breath.  MAKE SURE YOU:  Understand these instructions. Will watch your condition. Will get help right away if you are not doing well or get worse.  Thank you for choosing an e-visit. Your e-visit answers were reviewed by a board certified advanced clinical practitioner to complete your personal care plan. Depending upon the condition, your plan could have included both over the counter or prescription medications. Please review your pharmacy choice. Be sure that the pharmacy you have chosen is open so that you can pick up your prescription now.  If there is a problem you may message your provider in MyChart to have the prescription routed to another pharmacy. Your safety is important to [place]. If you have drug allergies check your prescription carefully.  For the next 24 hours, you can use MyChart to ask questions about today's visit, request a non-urgent call back, or ask for a work or school excuse from your e-visit provider. You will get an email in the next two days asking about  your experience. I hope that your e-visit has been valuable and will speed your recovery.

## 2018-06-29 MED FILL — DILTIAZEM HCL ER 360 MG CAP: 360 | 90 days supply | Qty: 90 | Fill #2

## 2018-06-30 ENCOUNTER — Telehealth: Payer: BLUE CROSS/BLUE SHIELD | Admitting: Physician Assistant

## 2018-06-30 DIAGNOSIS — R21 Rash and other nonspecific skin eruption: Secondary | ICD-10-CM

## 2018-06-30 NOTE — Progress Notes (Signed)
Based on what you shared with me it looks like you have a condition that should be evaluated in a face to face office visit. Giving recurrence of symptoms, I recommend you be seen in person. You likely require a steroid shot followed by an extended prednisone taper.   NOTE: If you entered your credit card information for this eVisit, you will not be charged. You may see a "hold" on your card for the $30 but that hold will drop off and you will not have a charge processed.  If you are having a true medical emergency please call 911.  If you need an urgent face to face visit, [place] has four urgent care centers for your convenience.  If you need care fast and have a high deductible or no insurance consider:   [email or web address] to reserve your spot online an avoid wait times  [facility] [address], [address] [place], [place] [address] 8 am to 8 pm Monday-Friday 10 am to 4 pm Saturday-Sunday *Across the street from [employer]  [facility]  [address] [place], [id number] 8 am to 5 pm Monday-Friday * In the [facility] on the [facility]   The following sites will take your  insurance:  . [facility]  [phone] Get Driving Directions Find a Provider at this Location  [address] [place], [place] [address] . 10 am to 8 pm Monday-Friday . 12 pm to 8 pm Saturday-Sunday   . [facility]  [phone] Get Driving Directions Find a Provider at this Location  [address] [place], [place] [address] . 8 am to 8 pm Monday-Friday . 9 am to 6 pm Saturday . 11 am to 6 pm Sunday   . [facility] [place]  [phone] Get Driving Directions  [address] [place], [place] [address] . 8 am to 8 pm Monday-Friday . 8 am to 4 pm Saturday-Sunday   Your e-visit answers were reviewed by a board certified advanced  clinical practitioner to complete your personal care plan.  Thank you for using e-Visits.

## 2018-07-02 DIAGNOSIS — E785 Hyperlipidemia, unspecified: Secondary | ICD-10-CM | POA: Insufficient documentation

## 2018-07-02 DIAGNOSIS — E039 Hypothyroidism, unspecified: Secondary | ICD-10-CM | POA: Insufficient documentation

## 2018-07-02 DIAGNOSIS — E7800 Pure hypercholesterolemia, unspecified: Secondary | ICD-10-CM | POA: Insufficient documentation

## 2018-07-05 ENCOUNTER — Telehealth: Payer: Self-pay | Admitting: *Deleted

## 2018-07-05 DIAGNOSIS — T7800XA Anaphylactic reaction due to unspecified food, initial encounter: Secondary | ICD-10-CM

## 2018-07-05 NOTE — Telephone Encounter (Signed)
-----   Message from [facility] Larose HiresPatricia Padgett, MD sent at 07/05/2018  8:33 AM EDT ----- Can someone put in lab orders for shellfish IgE panel, strawberry IgE and grape IgE.   Thanks.  I sent her a mychart mssg that she can come to [place] office to have them drawn.

## 2018-07-05 NOTE — Telephone Encounter (Signed)
Orders placed for patient to come to office for blood draw.

## 2018-07-18 MED FILL — TRI FEMYNOR 28 TABLET: 0.18/0.215/ | 84 days supply | Qty: 84 | Fill #1

## 2018-07-18 MED FILL — LEVOTHYROXINE 100 MCG TABLE: 100 | 90 days supply | Qty: 90 | Fill #1

## 2018-07-20 LAB — ALLERGEN, STRAWBERRY, F44

## 2018-07-20 LAB — ALLERGEN PROFILE, SHELLFISH
Clam IgE: <0.1 kU/L
F023-IgE Crab: <0.1 kU/L
F080-IgE Lobster: <0.1 kU/L

## 2018-07-20 LAB — ALLERGEN GRAPE F259: Allergen Grape IgE: <0.1 kU/L

## 2018-08-11 ENCOUNTER — Encounter (INDEPENDENT_AMBULATORY_CARE_PROVIDER_SITE_OTHER): Payer: No Typology Code available for payment source

## 2018-08-11 MED FILL — hydrOXYzine HCL 50 MG TABS: 50 | 12 days supply | Qty: 90 | Fill #0

## 2018-08-21 ENCOUNTER — Encounter (INDEPENDENT_AMBULATORY_CARE_PROVIDER_SITE_OTHER): Payer: Self-pay | Admitting: Family Medicine

## 2018-08-24 ENCOUNTER — Encounter (INDEPENDENT_AMBULATORY_CARE_PROVIDER_SITE_OTHER): Payer: Self-pay | Admitting: Family Medicine

## 2018-08-24 ENCOUNTER — Ambulatory Visit (INDEPENDENT_AMBULATORY_CARE_PROVIDER_SITE_OTHER): Payer: No Typology Code available for payment source | Admitting: Family Medicine

## 2018-08-24 VITALS — BP 124/76 | HR 73 | Temp 98.0°F | Ht 65.0 in | Wt 231.0 lb

## 2018-08-24 DIAGNOSIS — Z0289 Encounter for other administrative examinations: Secondary | ICD-10-CM

## 2018-08-24 DIAGNOSIS — R5383 Other fatigue: Secondary | ICD-10-CM

## 2018-08-24 DIAGNOSIS — Z1331 Encounter for screening for depression: Secondary | ICD-10-CM | POA: Diagnosis not present

## 2018-08-24 DIAGNOSIS — Z6838 Body mass index (BMI) 38.0-38.9, adult: Secondary | ICD-10-CM

## 2018-08-24 DIAGNOSIS — Z9189 Other specified personal risk factors, not elsewhere classified: Secondary | ICD-10-CM | POA: Diagnosis not present

## 2018-08-24 DIAGNOSIS — R0602 Shortness of breath: Secondary | ICD-10-CM | POA: Diagnosis not present

## 2018-08-24 DIAGNOSIS — I471 Supraventricular tachycardia: Secondary | ICD-10-CM | POA: Diagnosis not present

## 2018-08-25 ENCOUNTER — Encounter (INDEPENDENT_AMBULATORY_CARE_PROVIDER_SITE_OTHER): Payer: Self-pay | Admitting: Family Medicine

## 2018-08-25 LAB — LIPID PANEL WITH LDL/HDL RATIO
Cholesterol, Total: 212 mg/dL — ABNORMAL HIGH (ref 100–199)
HDL: 78 mg/dL (ref >39)
LDL Calculated: 110 mg/dL — ABNORMAL HIGH (ref 0–99)
LDl/HDL Ratio: 1.4 ratio (ref 0.0–3.2)
Triglycerides: 118 mg/dL (ref 0–149)
VLDL CHOLESTEROL CAL: 24 mg/dL (ref 5–40)

## 2018-08-25 LAB — COMPREHENSIVE METABOLIC PANEL
A/G RATIO: 1.8 (ref 1.2–2.2)
ALK PHOS: 64 IU/L (ref 39–117)
ALT: 11 IU/L (ref 0–32)
AST: 18 IU/L (ref 0–40)
Albumin: 4.6 g/dL (ref 3.5–5.5)
BILIRUBIN TOTAL: 0.4 mg/dL (ref 0.0–1.2)
BUN / CREAT RATIO: 15 (ref 9–23)
BUN: 11 mg/dL (ref 6–20)
CO2: 22 mmol/L (ref 20–29)
Calcium: 9.2 mg/dL (ref 8.7–10.2)
Chloride: 101 mmol/L (ref 96–106)
Creatinine, Ser: 0.75 mg/dL (ref 0.57–1.00)
GFR calc Af Amer: 118 mL/min/{1.73_m2} (ref >59)
GFR calc non Af Amer: 102 mL/min/{1.73_m2} (ref >59)
GLOBULIN, TOTAL: 2.5 g/dL (ref 1.5–4.5)
GLUCOSE: 83 mg/dL (ref 65–99)
POTASSIUM: 4.1 mmol/L (ref 3.5–5.2)
SODIUM: 141 mmol/L (ref 134–144)
TOTAL PROTEIN: 7.1 g/dL (ref 6.0–8.5)

## 2018-08-25 LAB — CBC WITH DIFFERENTIAL
BASOS: 1 %
Basophils Absolute: 0.1 10*3/uL (ref 0.0–0.2)
EOS (ABSOLUTE): 0.2 10*3/uL (ref 0.0–0.4)
Eos: 3 %
Hematocrit: 40.3 % (ref 34.0–46.6)
Hemoglobin: 13.8 g/dL (ref 11.1–15.9)
IMMATURE GRANS (ABS): 0 10*3/uL (ref 0.0–0.1)
Immature Granulocytes: 0 %
LYMPHS: 27 %
Lymphocytes Absolute: 2 10*3/uL (ref 0.7–3.1)
MCH: 31.4 pg (ref 26.6–33.0)
MCHC: 34.2 g/dL (ref 31.5–35.7)
MCV: 92 fL (ref 79–97)
MONOCYTES: 7 %
Monocytes Absolute: 0.5 10*3/uL (ref 0.1–0.9)
NEUTROS ABS: 4.6 10*3/uL (ref 1.4–7.0)
Neutrophils: 62 %
RBC: 4.39 x10E6/uL (ref 3.77–5.28)
RDW: 12.3 % (ref 12.3–15.4)
WBC: 7.3 10*3/uL (ref 3.4–10.8)

## 2018-08-25 LAB — FOLATE: FOLATE: 18.2 ng/mL (ref >3.0)

## 2018-08-25 LAB — INSULIN, RANDOM: INSULIN: 6.2 u[IU]/mL (ref 2.6–24.9)

## 2018-08-25 LAB — VITAMIN D 25 HYDROXY (VIT D DEFICIENCY, FRACTURES): Vit D, 25-Hydroxy: 39.8 ng/mL (ref 30.0–100.0)

## 2018-08-25 LAB — VITAMIN B12: Vitamin B-12: 557 pg/mL (ref 232–1245)

## 2018-08-25 LAB — HEMOGLOBIN A1C
Est. average glucose Bld gHb Est-mCnc: 103 mg/dL
Hgb A1c MFr Bld: 5.2 % (ref 4.8–5.6)

## 2018-08-25 LAB — T3: T3 TOTAL: 171 ng/dL (ref 71–180)

## 2018-08-25 LAB — TSH: TSH: 2.03 u[IU]/mL (ref 0.450–4.500)

## 2018-08-25 LAB — T4, FREE: Free T4: 1.41 ng/dL (ref 0.82–1.77)

## 2018-08-29 NOTE — Progress Notes (Signed)
Office: [phone]  /  Fax: [phone]   Dear Milus Height, PA-C,   Thank you for referring Madison [name] to our clinic. The following note includes my evaluation and treatment recommendations.  HPI:   Chief Complaint: OBESITY    SHAKEELA RABADAN has been referred by Milus Height, PA-C for consultation regarding her obesity and obesity related comorbidities.    Madison [name] (MR# [id number]) is a 37 y.o. female who presents on 08/29/2018 for obesity evaluation and treatment. Current BMI is Body mass index is 38.44 kg/m.Marland Kitchen Madison [name] has been struggling with her weight for many years and has been unsuccessful in either losing weight, maintaining weight loss, or reaching her healthy weight goal.     Madison [name] attended our information session and states she is currently in the action stage of change and ready to dedicate time achieving and maintaining a healthier weight. Madison [name] is interested in becoming our patient and working on intensive lifestyle modifications including (but not limited to) diet, exercise and weight loss.    Madison [name] states her family eats meals together she thinks her family will eat healthier with  her her desired weight loss is 55 to 65 she started gaining weight in the last 5 yrs her heaviest weight ever was 235 lbs. she has significant food cravings issues  she is frequently drinking liquids with calories she struggles with emotional eating    Fatigue Madison [name] feels her energy is lower than it should be. This has worsened with weight gain and has not worsened recently. Madison [name] admits to daytime somnolence and admits to waking up still tired. Patient is at risk for obstructive sleep apnea. Patent has a history of symptoms of daytime fatigue and morning fatigue. Patient generally gets 6 to 8 hours of sleep per night, and states they generally have restful sleep. Snoring is present. Apneic episodes are not present. Epworth Sleepiness Score is 2  EKG was ordered today which shows  normal sinus rhythm.  Dyspnea on exertion Madison [name] notes increasing shortness of breath with exercising and seems to be worsening over time with weight gain. She notes getting out of breath sooner with activity than she used to. This has not gotten worse recently. EKG was ordered today which shows normal sinus rhythm. Amali denies orthopnea.  SVT (supraventricular tachycardia) Madison [name] has a history of SVT and sees Dr. Elberta Fortis at [facility]. She is currently on Diltiazem. Zinnia has had no episodes in over one year.  At risk for cardiovascular disease Madison [name] is at a higher than average risk for cardiovascular disease due to obesity and SVT. She currently denies any chest pain.  Depression Screen Madison [name]'s Food and Mood (modified PHQ-9) score was  Depression screen PHQ 2/9 08/24/2018  Decreased Interest 1  Down, Depressed, Hopeless 2  PHQ - 2 Score 3  Altered sleeping 0  Tired, decreased energy 2  Change in appetite 3  Feeling bad or failure about yourself  2  Trouble concentrating 0  Moving slowly or fidgety/restless 0  Suicidal thoughts 0  PHQ-9 Score 10  Difficult doing work/chores Not difficult at all    ALLERGIES: Allergies  Allergen Reactions  . Atorvastatin Other (See Comments)    Joint pain  . Crestor [Rosuvastatin Calcium] Swelling  . Latex Rash  . Neosporin [Neomycin-Bacitracin Zn-Polymyx] Rash  . Toradol [Ketorolac Tromethamine] Rash    MEDICATIONS: Current Outpatient Medications on File Prior to Visit  Medication Sig Dispense Refill  . diltiazem (CARDIZEM CD) 360 MG 24 hr capsule Take 1  capsule (360 mg total) by mouth daily. 90 capsule 3  . diltiazem (CARDIZEM) 30 MG tablet Take 1 tablet (30 mg total) by mouth as needed (SVT EPISODES). 90 tablet 3  . hydrOXYzine (ATARAX/VISTARIL) 50 MG tablet TAKE 1 TO 2 TABLETS BY MOUTH EVERY 6 HOURS AS NEEDED FOR ITCHIN  1  . levothyroxine (SYNTHROID, LEVOTHROID) 100 MCG tablet Take 100 mcg by mouth daily before breakfast.    .  loratadine (CLARITIN) 10 MG tablet Take 10 mg by mouth daily.    . norgestimate-ethinyl estradiol (ORTHO-CYCLEN,SPRINTEC,PREVIFEM) 0.25-35 MG-MCG tablet Take 1 tablet by mouth daily.    . Pitavastatin Calcium (LIVALO PO) Take by mouth.    . TRI FEMYNOR 0.18/0.215/0.25 MG-35 MCG tablet Take 1 tablet by mouth daily.  3  . fluticasone (FLONASE) 50 MCG/ACT nasal spray Place 2 sprays into both nostrils daily for 10 days. 16 g 0   No current facility-administered medications on file prior to visit.     PAST MEDICAL HISTORY: Past Medical History:  Diagnosis Date  . Allergy   . Anxiety   . Asthma   . Back pain   . Constipation   . Eczema   . Exercise-induced bronchospasm 06/12/2016  . GERD (gastroesophageal reflux disease)   . Herpes simplex disease   . Hyperlipidemia   . Hypothyroidism   . Joint pain   . Palpitation   . SOB (shortness of breath)   . SVT (supraventricular tachycardia) (HCC) 11/30/2016  . Swelling    bilat LE    PAST SURGICAL HISTORY: Past Surgical History:  Procedure Laterality Date  . Gum Grafting  11/2015, 06/2017  . KNEE ARTHROSCOPY Right 2001  . WISDOM TOOTH EXTRACTION Bilateral 1997    SOCIAL HISTORY: Social History   Tobacco Use  . Smoking status: Never Smoker  . Smokeless tobacco: Never Used  Substance Use Topics  . Alcohol use: Yes  . Drug use: No    FAMILY HISTORY: Family History  Problem Relation Age of Onset  . Eczema Father   . Sleep apnea Father   . High blood pressure Mother   . High Cholesterol Mother   . Thyroid disease Mother   . Allergic rhinitis Neg Hx   . Angioedema Neg Hx   . Asthma Neg Hx   . Atopy Neg Hx   . Immunodeficiency Neg Hx   . Urticaria Neg Hx     ROS: Review of Systems  Constitutional: Positive for malaise/fatigue.       + Breasts Lumps + Breasts pain  HENT: Positive for congestion (nasal stuffiness), ear pain, hearing loss and tinnitus.        + Difficult or Painful Swallowing + Dry Mouth + Hoarseness    Eyes:       + Floaters  Respiratory: Positive for shortness of breath (on exertion).   Cardiovascular: Positive for palpitations. Negative for chest pain and orthopnea.  Musculoskeletal:       + Muscle or Joint Pain + Muscle Stiffness   Skin: Positive for itching and rash.       + Dryness  Neurological: Positive for headaches.  Psychiatric/Behavioral: The patient has insomnia.     PHYSICAL EXAM: Blood pressure 124/76, pulse 73, temperature 98 F (36.7 C), temperature source Oral, height 5\' 5"  (1.651 m), weight 231 lb (104.8 kg), last menstrual period 08/16/2018, SpO2 98 %. Body mass index is 38.44 kg/m. Physical Exam  Constitutional: She is oriented to person, place, and time. She appears well-developed and well-nourished.  HENT:  Head: Normocephalic and atraumatic.  Nose: Nose normal.  Eyes: EOM are normal. No scleral icterus.  Neck: Normal range of motion. Neck supple. No thyromegaly present.  Cardiovascular: Normal rate and regular rhythm.  Pulmonary/Chest: No respiratory distress.  Abdominal: Soft. There is no tenderness.  + Obesity  Musculoskeletal: Normal range of motion.  Range of Motion normal in all 4 extremities  Neurological: She is alert and oriented to person, place, and time. Coordination normal.  Skin: Skin is warm and dry.  Psychiatric: She has a normal mood and affect.  Vitals reviewed.   RECENT LABS AND TESTS: BMET    Component Value Date/Time   NA 141 08/24/2018 1217   K 4.1 08/24/2018 1217   CL 101 08/24/2018 1217   CO2 22 08/24/2018 1217   GLUCOSE 83 08/24/2018 1217   BUN 11 08/24/2018 1217   CREATININE 0.75 08/24/2018 1217   CALCIUM 9.2 08/24/2018 1217   GFRNONAA 102 08/24/2018 1217   GFRAA 118 08/24/2018 1217   Lab Results  Component Value Date   HGBA1C 5.2 08/24/2018   Lab Results  Component Value Date   INSULIN 6.2 08/24/2018   CBC    Component Value Date/Time   WBC 7.3 08/24/2018 1217   RBC 4.39 08/24/2018 1217   HGB 13.8  08/24/2018 1217   HCT 40.3 08/24/2018 1217   MCV 92 08/24/2018 1217   MCH 31.4 08/24/2018 1217   MCHC 34.2 08/24/2018 1217   RDW 12.3 08/24/2018 1217   LYMPHSABS 2.0 08/24/2018 1217   EOSABS 0.2 08/24/2018 1217   BASOSABS 0.1 08/24/2018 1217   Iron/TIBC/Ferritin/ %Sat No results found for: IRON, TIBC, FERRITIN, IRONPCTSAT Lipid Panel     Component Value Date/Time   CHOL 212 (H) 08/24/2018 1217   TRIG 118 08/24/2018 1217   HDL 78 08/24/2018 1217   LDLCALC 110 (H) 08/24/2018 1217   Hepatic Function Panel     Component Value Date/Time   PROT 7.1 08/24/2018 1217   ALBUMIN 4.6 08/24/2018 1217   AST 18 08/24/2018 1217   ALT 11 08/24/2018 1217   ALKPHOS 64 08/24/2018 1217   BILITOT 0.4 08/24/2018 1217      Component Value Date/Time   TSH 2.030 08/24/2018 1217   TSH 4.214 11/24/2016 0400    ECG  shows NSR with a rate of 78 BPM INDIRECT CALORIMETER done today shows a VO2 of 233 and a REE of 1620.  Her calculated basal metabolic rate is 1610 thus her basal metabolic rate is worse than expected.    ASSESSMENT AND PLAN: Other fatigue - Plan: EKG 12-Lead, Vitamin B12, CBC With Differential, Comprehensive metabolic panel, Folate, Hemoglobin A1c, Insulin, random, Lipid Panel With LDL/HDL Ratio, T3, T4, free, TSH, VITAMIN D 25 Hydroxy (Vit-D Deficiency, Fractures)  Shortness of breath on exertion - Plan: Lipid Panel With LDL/HDL Ratio  SVT (supraventricular tachycardia) (HCC)  Depression screening  At risk for heart disease  Class 2 severe obesity with serious comorbidity and body mass index (BMI) of 38.0 to 38.9 in adult, unspecified obesity type (HCC)  PLAN: Fatigue Ajane was informed that her fatigue may be related to obesity, depression or many other causes. Labs will be ordered, and in the meanwhile Daffney has agreed to work on diet, exercise and weight loss to help with fatigue. Proper sleep hygiene was discussed including the need for 7-8 hours of quality sleep each  night. A sleep study was not ordered based on symptoms and Epworth score. We will order indirect calorimetry and EKG  today.  Dyspnea on exertion Madison [name]'s shortness of breath appears to be obesity related and exercise induced. She has agreed to work on weight loss and gradually increase exercise to treat her exercise induced shortness of breath. If Madison [name] follows our instructions and loses weight without improvement of her shortness of breath, we will plan to refer to pulmonology.  We will order indirect calorimetry, EKG and labs today. We will monitor this condition regularly. Madison [name] agrees to this plan.  SVT (supraventricular tachycardia) We will order EKG today and Madison [name] will follow up with our clinic in 2 weeks.  Cardiovascular risk counseling Madison [name] was given extended (15 minutes) coronary artery disease prevention counseling today. She is 37 y.o. female and has risk factors for heart disease including obesity and SVT. We discussed intensive lifestyle modifications today with an emphasis on specific weight loss instructions and strategies. Pt was also informed of the importance of increasing exercise and decreasing saturated fats to help prevent heart disease.  Depression Screen Madison [name] had a moderately positive depression screening. Depression is commonly associated with obesity and often results in emotional eating behaviors. We will monitor this closely and work on CBT to help improve the non-hunger eating patterns. Referral to Psychology may be required if no improvement is seen as she continues in our clinic.  Obesity Madison [name] is currently in the action stage of change and her goal is to continue with weight loss efforts. I recommend Madison [name] begin the structured treatment plan as follows:  She has agreed to follow the Category 2 plan +100 calories Madison [name] has been instructed to eventually work up to a goal of 150 minutes of combined cardio and strengthening exercise per week for weight loss and  overall health benefits. We discussed the following Behavioral Modification Strategies today: planning for success, increasing lean protein intake, increasing vegetables and work on meal planning and easy cooking plans   She was informed of the importance of frequent follow up visits to maximize her success with intensive lifestyle modifications for her multiple health conditions. She was informed we would discuss her lab results at her next visit unless there is a critical issue that needs to be addressed sooner. Madison [name] agreed to keep her next visit at the agreed upon time to discuss these results.    OBESITY BEHAVIORAL INTERVENTION VISIT  Today's visit was # 1   Starting weight: 231 lbs Starting date: 08/24/18 Today's weight : 231 lbs  Today's date: 08/24/2018 Total lbs lost to date: 0   ASK: We discussed the diagnosis of obesity with Madison [name] today and Madison [name] agreed to give [place] permission to discuss obesity behavioral modification therapy today.  ASSESS: Madison [name] has the diagnosis of obesity and her BMI today is 38.44 Wretha is in the action stage of change   ADVISE: Kamoria was educated on the multiple health risks of obesity as well as the benefit of weight loss to improve her health. She was advised of the need for long term treatment and the importance of lifestyle modifications to improve her current health and to decrease her risk of future health problems.  AGREE: Multiple dietary modification options and treatment options were discussed and  Kashawn agreed to follow the recommendations documented in the above note.  ARRANGE: Shakiyla was educated on the importance of frequent visits to treat obesity as outlined per CMS and USPSTF guidelines and agreed to schedule her next follow up appointment today.  Cristi Loron, am acting as transcriptionist for Filbert Schilder, MD  I have reviewed the above documentation for accuracy and completeness, and I agree with the above.  - Debbra Riding, MD

## 2018-09-07 ENCOUNTER — Ambulatory Visit (INDEPENDENT_AMBULATORY_CARE_PROVIDER_SITE_OTHER): Payer: No Typology Code available for payment source | Admitting: Family Medicine

## 2018-09-07 VITALS — BP 120/80 | HR 71 | Temp 97.8°F | Ht 65.0 in | Wt 230.0 lb

## 2018-09-07 DIAGNOSIS — E7849 Other hyperlipidemia: Secondary | ICD-10-CM

## 2018-09-07 DIAGNOSIS — Z9189 Other specified personal risk factors, not elsewhere classified: Secondary | ICD-10-CM

## 2018-09-07 DIAGNOSIS — E559 Vitamin D deficiency, unspecified: Secondary | ICD-10-CM | POA: Diagnosis not present

## 2018-09-07 DIAGNOSIS — Z6838 Body mass index (BMI) 38.0-38.9, adult: Secondary | ICD-10-CM

## 2018-09-08 MED ORDER — VITAMIN D (ERGOCALCIFEROL) 1.25 MG (50000 UNIT) PO CAPS: 50000.0000 [IU] | ORAL_CAPSULE | ORAL | 0 refills | Status: DC

## 2018-09-08 NOTE — Progress Notes (Signed)
Office: [phone]  /  Fax: [phone]   HPI:   Chief Complaint: OBESITY Madison [name] is here to discuss her progress with her obesity treatment plan. She is on the Category 2 plan + 100 calories and is following her eating plan approximately 90 % of the time. She states she is doing orange theory for 60 minutes 4-5 times per week. [name] voices that she was hungry after dinner and 4 days ago she was hungry almost all day.  Her weight is 230 lb (104.3 kg) today and has had a weight loss of 1 pound over a period of 2 weeks since her last visit. She has lost 1 lb since starting treatment with [place].  Hyperlipidemia Madison [name] has hyperlipidemia and has been trying to improve her cholesterol levels with intensive lifestyle modification including a low saturated fat diet, exercise and weight loss. Her LDL was of 110, she is not on medications and denies any chest pain, claudication or myalgias.  Vitamin D Deficiency Madison [name] has a diagnosis of vitamin D deficiency. She is not currently taking Vit D. She notes fatigue and denies nausea, vomiting or muscle weakness.  At risk for osteopenia and osteoporosis Madison [name] is at higher risk of osteopenia and osteoporosis due to vitamin D deficiency.   ALLERGIES: Allergies  Allergen Reactions  . Atorvastatin Other (See Comments)    Joint pain  . Crestor [Rosuvastatin Calcium] Swelling  . Latex Rash  . Neosporin [Neomycin-Bacitracin Zn-Polymyx] Rash  . Toradol [Ketorolac Tromethamine] Rash    MEDICATIONS: Current Outpatient Medications on File Prior to Visit  Medication Sig Dispense Refill  . diltiazem (CARDIZEM CD) 360 MG 24 hr capsule Take 1 capsule (360 mg total) by mouth daily. 90 capsule 3  . diltiazem (CARDIZEM) 30 MG tablet Take 1 tablet (30 mg total) by mouth as needed (SVT EPISODES). 90 tablet 3  . hydrOXYzine (ATARAX/VISTARIL) 50 MG tablet TAKE 1 TO 2 TABLETS BY MOUTH EVERY 6 HOURS AS NEEDED FOR ITCHIN  1  . levothyroxine (SYNTHROID, LEVOTHROID) 100  MCG tablet Take 100 mcg by mouth daily before breakfast.    . loratadine (CLARITIN) 10 MG tablet Take 10 mg by mouth daily.    . Pitavastatin Calcium (LIVALO PO) Take by mouth.    . TRI FEMYNOR 0.18/0.215/0.25 MG-35 MCG tablet Take 1 tablet by mouth daily.  3   No current facility-administered medications on file prior to visit.     PAST MEDICAL HISTORY: Past Medical History:  Diagnosis Date  . Allergy   . Anxiety   . Asthma   . Back pain   . Constipation   . Eczema   . Exercise-induced bronchospasm 06/12/2016  . GERD (gastroesophageal reflux disease)   . Herpes simplex disease   . Hyperlipidemia   . Hypothyroidism   . Joint pain   . Palpitation   . SOB (shortness of breath)   . SVT (supraventricular tachycardia) (HCC) 11/30/2016  . Swelling    bilat LE    PAST SURGICAL HISTORY: Past Surgical History:  Procedure Laterality Date  . Gum Grafting  11/2015, 06/2017  . KNEE ARTHROSCOPY Right 2001  . WISDOM TOOTH EXTRACTION Bilateral 1997    SOCIAL HISTORY: Social History   Tobacco Use  . Smoking status: Never Smoker  . Smokeless tobacco: Never Used  Substance Use Topics  . Alcohol use: Yes  . Drug use: No    FAMILY HISTORY: Family History  Problem Relation Age of Onset  . Eczema Father   . Sleep apnea Father   .  High blood pressure Mother   . High Cholesterol Mother   . Thyroid disease Mother   . Allergic rhinitis Neg Hx   . Angioedema Neg Hx   . Asthma Neg Hx   . Atopy Neg Hx   . Immunodeficiency Neg Hx   . Urticaria Neg Hx     ROS: Review of Systems  Constitutional: Positive for malaise/fatigue and weight loss.  Cardiovascular: Negative for chest pain and claudication.  Gastrointestinal: Negative for nausea and vomiting.  Musculoskeletal: Negative for myalgias.       Negative muscle weakness    PHYSICAL EXAM: Blood pressure 120/80, pulse 71, temperature 97.8 F (36.6 C), temperature source Oral, height 5\' 5"  (1.651 m), weight 230 lb (104.3 kg), last  menstrual period 08/16/2018, SpO2 98 %. Body mass index is 38.27 kg/m. Physical Exam  Constitutional: She is oriented to person, place, and time. She appears well-developed and well-nourished.  Cardiovascular: Normal rate.  Pulmonary/Chest: Effort normal.  Musculoskeletal: Normal range of motion.  Neurological: She is oriented to person, place, and time.  Skin: Skin is warm and dry.  Psychiatric: She has a normal mood and affect. Her behavior is normal.  Vitals reviewed.   RECENT LABS AND TESTS: BMET    Component Value Date/Time   NA 141 08/24/2018 1217   K 4.1 08/24/2018 1217   CL 101 08/24/2018 1217   CO2 22 08/24/2018 1217   GLUCOSE 83 08/24/2018 1217   BUN 11 08/24/2018 1217   CREATININE 0.75 08/24/2018 1217   CALCIUM 9.2 08/24/2018 1217   GFRNONAA 102 08/24/2018 1217   GFRAA 118 08/24/2018 1217   Lab Results  Component Value Date   HGBA1C 5.2 08/24/2018   Lab Results  Component Value Date   INSULIN 6.2 08/24/2018   CBC    Component Value Date/Time   WBC 7.3 08/24/2018 1217   RBC 4.39 08/24/2018 1217   HGB 13.8 08/24/2018 1217   HCT 40.3 08/24/2018 1217   MCV 92 08/24/2018 1217   MCH 31.4 08/24/2018 1217   MCHC 34.2 08/24/2018 1217   RDW 12.3 08/24/2018 1217   LYMPHSABS 2.0 08/24/2018 1217   EOSABS 0.2 08/24/2018 1217   BASOSABS 0.1 08/24/2018 1217   Iron/TIBC/Ferritin/ %Sat No results found for: IRON, TIBC, FERRITIN, IRONPCTSAT Lipid Panel     Component Value Date/Time   CHOL 212 (H) 08/24/2018 1217   TRIG 118 08/24/2018 1217   HDL 78 08/24/2018 1217   LDLCALC 110 (H) 08/24/2018 1217   Hepatic Function Panel     Component Value Date/Time   PROT 7.1 08/24/2018 1217   ALBUMIN 4.6 08/24/2018 1217   AST 18 08/24/2018 1217   ALT 11 08/24/2018 1217   ALKPHOS 64 08/24/2018 1217   BILITOT 0.4 08/24/2018 1217      Component Value Date/Time   TSH 2.030 08/24/2018 1217   TSH 4.214 11/24/2016 0400  Results for BRITTANIE, DOSANJH (MRN [id number]) as of  09/08/2018 09:54  Ref. Range 08/24/2018 12:17  Vitamin D, 25-Hydroxy Latest Ref Range: 30.0 - 100.0 ng/mL 39.8    ASSESSMENT AND PLAN: Vitamin D deficiency - Plan: Vitamin D, Ergocalciferol, (DRISDOL) 50000 units CAPS capsule  Other hyperlipidemia  At risk for osteoporosis  Class 2 severe obesity with serious comorbidity and body mass index (BMI) of 38.0 to 38.9 in adult, unspecified obesity type (HCC)  PLAN:  Hyperlipidemia Anabell was informed of the American Heart Association Guidelines emphasizing intensive lifestyle modifications as the first line treatment for hyperlipidemia. We discussed many  lifestyle modifications today in depth, and Mylin will continue to work on decreasing saturated fats such as fatty red meat, butter and many fried foods. She will also increase vegetables and lean protein in her diet and continue to work on exercise and weight loss efforts. We will repeat labs in 3 months. Jerri agrees to follow up with our clinic in 2 weeks.  Vitamin D Deficiency Verble was informed that low vitamin D levels contributes to fatigue and are associated with obesity, breast, and colon cancer. Dalyce agrees to start prescription Vit D @50 ,000 IU every week #4 with no refills. She will follow up for routine testing of vitamin D, at least 2-3 times per year. She was informed of the risk of over-replacement of vitamin D and agrees to not increase her dose unless she discusses this with [place] first. Miray agrees to follow up with our clinic in 2 weeks.  At risk for osteopenia and osteoporosis Ilaria was given extended (30 minutes) osteoporosis prevention counseling today. Margaretha is at risk for osteopenia and osteoporsis due to her vitamin D deficiency. She was encouraged to take her vitamin D and follow her higher calcium diet and increase strengthening exercise to help strengthen her bones and decrease her risk of osteopenia and osteoporosis.  Obesity Yeslin is currently in the action stage of  change. As such, her goal is to continue with weight loss efforts She has agreed to follow the Category 3 plan Jillienne has been instructed to work up to a goal of 150 minutes of combined cardio and strengthening exercise per week for weight loss and overall health benefits. We discussed the following Behavioral Modification Strategies today: increasing lean protein intake, increasing vegetables, work on meal planning and easy cooking plans, and planning for success   Lenay has agreed to follow up with our clinic in 2 weeks. She was informed of the importance of frequent follow up visits to maximize her success with intensive lifestyle modifications for her multiple health conditions.   OBESITY BEHAVIORAL INTERVENTION VISIT  Today's visit was # 2   Starting weight: 231 lbs Starting date: 08/24/18 Today's weight : 230 lbs Today's date: 09/07/2018 Total lbs lost to date: 1    ASK: We discussed the diagnosis of obesity with Lajoyce Corners today and Vetra agreed to give [place] permission to discuss obesity behavioral modification therapy today.  ASSESS: Thurma has the diagnosis of obesity and her BMI today is 38.27 Kitzia is in the action stage of change   ADVISE: Martisha was educated on the multiple health risks of obesity as well as the benefit of weight loss to improve her health. She was advised of the need for long term treatment and the importance of lifestyle modifications to improve her current health and to decrease her risk of future health problems.  AGREE: Multiple dietary modification options and treatment options were discussed and  Emmanuelle agreed to follow the recommendations documented in the above note.  ARRANGE: Anesa was educated on the importance of frequent visits to treat obesity as outlined per CMS and USPSTF guidelines and agreed to schedule her next follow up appointment today.  I, Burt Knack, am acting as transcriptionist for Debbra Riding, MD  I have reviewed  the above documentation for accuracy and completeness, and I agree with the above. - Debbra Riding, MD

## 2018-09-09 ENCOUNTER — Encounter (INDEPENDENT_AMBULATORY_CARE_PROVIDER_SITE_OTHER): Payer: Self-pay | Admitting: Family Medicine

## 2018-09-28 ENCOUNTER — Ambulatory Visit (INDEPENDENT_AMBULATORY_CARE_PROVIDER_SITE_OTHER): Payer: No Typology Code available for payment source | Admitting: Family Medicine

## 2018-09-28 VITALS — BP 108/71 | HR 76 | Temp 98.2°F | Ht 65.0 in | Wt 224.0 lb

## 2018-09-28 DIAGNOSIS — E038 Other specified hypothyroidism: Secondary | ICD-10-CM

## 2018-09-28 DIAGNOSIS — E7849 Other hyperlipidemia: Secondary | ICD-10-CM

## 2018-09-28 DIAGNOSIS — Z6837 Body mass index (BMI) 37.0-37.9, adult: Secondary | ICD-10-CM | POA: Diagnosis not present

## 2018-09-28 MED FILL — DILTIAZEM 24HR ER 360 MG CA: 360 | 30 days supply | Qty: 30 | Fill #3

## 2018-09-28 MED FILL — MOMETASONE FUROATE 50 MCG S: 50 | 30 days supply | Qty: 17 | Fill #0

## 2018-09-30 MED FILL — TRI FEMYNOR 28 TABLET: 0.18/0.215/ | 84 days supply | Qty: 84 | Fill #2

## 2018-10-04 DIAGNOSIS — J341 Cyst and mucocele of nose and nasal sinus: Secondary | ICD-10-CM

## 2018-10-04 NOTE — Progress Notes (Signed)
Office: [phone]  /  Fax: [phone]   HPI:   Chief Complaint: OBESITY Madison [name] is here to discuss her progress with her obesity treatment plan. She is on the Category 3 plan and is following her eating plan approximately 100 % of the time. She states she is doing [employer] for 60 minutes 4 to 5 times per week. Madison [name] is not really hungry, except at meal times (mostly at dinner). Thanksgiving is the only obstacle seen in the next few weeks as she is normally a dessert person. She has noticed an increase in energy since starting the meal plan.  Her weight is 224 lb (101.6 kg) today and has had a weight loss of 6 pounds over a period of 3 weeks since her last visit. She has lost 7 lbs since starting treatment with [place].  Hypothyroid Madison [name] has a diagnosis of hypothyroidism and she is on levothyroxine . She denies hot or cold intolerance or palpitations.  Hyperlipidemia Madison [name] has hyperlipidemia and has been trying to improve her cholesterol levels with intensive lifestyle modification including a low saturated fat diet, exercise and weight loss. She had an elevated LDL of 110 on her initial labs on 08/24/18.  ALLERGIES: Allergies  Allergen Reactions  . Atorvastatin Other (See Comments)    Joint pain  . Crestor [Rosuvastatin Calcium] Swelling  . Latex Rash  . Neosporin [Neomycin-Bacitracin Zn-Polymyx] Rash  . Toradol [Ketorolac Tromethamine] Rash    MEDICATIONS: Current Outpatient Medications on File Prior to Visit  Medication Sig Dispense Refill  . diltiazem (CARDIZEM CD) 360 MG 24 hr capsule Take 1 capsule (360 mg total) by mouth daily. 90 capsule 3  . diltiazem (CARDIZEM) 30 MG tablet Take 1 tablet (30 mg total) by mouth as needed (SVT EPISODES). 90 tablet 3  . hydrOXYzine (ATARAX/VISTARIL) 50 MG tablet TAKE 1 TO 2 TABLETS BY MOUTH EVERY 6 HOURS AS NEEDED FOR ITCHIN  1  . levothyroxine (SYNTHROID, LEVOTHROID) 100 MCG tablet Take 100 mcg by mouth daily before breakfast.      . loratadine (CLARITIN) 10 MG tablet Take 10 mg by mouth daily.    . Pitavastatin Calcium (LIVALO PO) Take by mouth.    . TRI FEMYNOR 0.18/0.215/0.25 MG-35 MCG tablet Take 1 tablet by mouth daily.  3  . Vitamin D, Ergocalciferol, (DRISDOL) 50000 units CAPS capsule Take 1 capsule (50,000 Units total) by mouth every 7 (seven) days. 4 capsule 0   No current facility-administered medications on file prior to visit.     PAST MEDICAL HISTORY: Past Medical History:  Diagnosis Date  . Allergy   . Anxiety   . Asthma   . Back pain   . Constipation   . Eczema   . Exercise-induced bronchospasm 06/12/2016  . GERD (gastroesophageal reflux disease)   . Herpes simplex disease   . Hyperlipidemia   . Hypothyroidism   . Joint pain   . Palpitation   . SOB (shortness of breath)   . SVT (supraventricular tachycardia) (HCC) 11/30/2016  . Swelling    bilat LE    PAST SURGICAL HISTORY: Past Surgical History:  Procedure Laterality Date  . Gum Grafting  11/2015, 06/2017  . KNEE ARTHROSCOPY Right 2001  . WISDOM TOOTH EXTRACTION Bilateral 1997    SOCIAL HISTORY: Social History   Tobacco Use  . Smoking status: Never Smoker  . Smokeless tobacco: Never Used  Substance Use Topics  . Alcohol use: Yes  . Drug use: No    FAMILY HISTORY: Family History  Problem Relation Age of Onset  . Eczema Father   . Sleep apnea Father   . High blood pressure Mother   . High Cholesterol Mother   . Thyroid disease Mother   . Allergic rhinitis Neg Hx   . Angioedema Neg Hx   . Asthma Neg Hx   . Atopy Neg Hx   . Immunodeficiency Neg Hx   . Urticaria Neg Hx     ROS: Review of Systems  Constitutional: Positive for weight loss.  Cardiovascular: Negative for palpitations.  Endo/Heme/Allergies:       Negative for cold intolerance. Negative for heat intolerance.    PHYSICAL EXAM: Blood pressure 108/71, pulse 76, temperature 98.2 F (36.8 C), temperature source Oral, height 5\' 5"  (1.651 m), weight 224 lb  (101.6 kg), SpO2 99 %. Body mass index is 37.28 kg/m. Physical Exam  Constitutional: She is oriented to person, place, and time. She appears well-developed and well-nourished.  Cardiovascular: Normal rate.  Pulmonary/Chest: Effort normal.  Musculoskeletal: Normal range of motion.  Neurological: She is oriented to person, place, and time.  Skin: Skin is warm and dry.  Psychiatric: She has a normal mood and affect. Her behavior is normal.  Vitals reviewed.   RECENT LABS AND TESTS: BMET    Component Value Date/Time   NA 141 08/24/2018 1217   K 4.1 08/24/2018 1217   CL 101 08/24/2018 1217   CO2 22 08/24/2018 1217   GLUCOSE 83 08/24/2018 1217   BUN 11 08/24/2018 1217   CREATININE 0.75 08/24/2018 1217   CALCIUM 9.2 08/24/2018 1217   GFRNONAA 102 08/24/2018 1217   GFRAA 118 08/24/2018 1217   Lab Results  Component Value Date   HGBA1C 5.2 08/24/2018   Lab Results  Component Value Date   INSULIN 6.2 08/24/2018   CBC    Component Value Date/Time   WBC 7.3 08/24/2018 1217   RBC 4.39 08/24/2018 1217   HGB 13.8 08/24/2018 1217   HCT 40.3 08/24/2018 1217   MCV 92 08/24/2018 1217   MCH 31.4 08/24/2018 1217   MCHC 34.2 08/24/2018 1217   RDW 12.3 08/24/2018 1217   LYMPHSABS 2.0 08/24/2018 1217   EOSABS 0.2 08/24/2018 1217   BASOSABS 0.1 08/24/2018 1217   Iron/TIBC/Ferritin/ %Sat No results found for: IRON, TIBC, FERRITIN, IRONPCTSAT Lipid Panel     Component Value Date/Time   CHOL 212 (H) 08/24/2018 1217   TRIG 118 08/24/2018 1217   HDL 78 08/24/2018 1217   LDLCALC 110 (H) 08/24/2018 1217   Hepatic Function Panel     Component Value Date/Time   PROT 7.1 08/24/2018 1217   ALBUMIN 4.6 08/24/2018 1217   AST 18 08/24/2018 1217   ALT 11 08/24/2018 1217   ALKPHOS 64 08/24/2018 1217   BILITOT 0.4 08/24/2018 1217      Component Value Date/Time   TSH 2.030 08/24/2018 1217   TSH 4.214 11/24/2016 0400   Results for Madison [name] (MRN [id number]) as of 10/04/2018  05:48  Ref. Range 08/24/2018 12:17  Vitamin D, 25-Hydroxy Latest Ref Range: 30.0 - 100.0 ng/mL 39.8   ASSESSMENT AND PLAN: Other specified hypothyroidism  Other hyperlipidemia  Class 2 severe obesity with serious comorbidity and body mass index (BMI) of 37.0 to 37.9 in adult, unspecified obesity type [facility])  PLAN:  Hypothyroid Madison [name] was informed of the importance of good thyroid control to help with weight loss efforts. She was also informed that supertherapeutic thyroid levels are dangerous and will not improve weight loss results. Madison [name]  agrees to continue taking levothyroxine and will follow up as directed in 2 weeks.  Hyperlipidemia Madison [name] was informed of the American Heart Association Guidelines emphasizing intensive lifestyle modifications as the first line treatment for hyperlipidemia. We discussed many lifestyle modifications today in depth, and Madison [name] will continue to work on decreasing saturated fats such as fatty red meat, butter and many fried foods. She will also increase vegetables and lean protein in her diet and continue to work on exercise and weight loss efforts. We will repeat labs in 2 months. Madison [name] agrees to follow up at the agreed upon time.  I spent > than 50% of the 15 minute visit on counseling as documented in the note.  Obesity Madison [name] is currently in the action stage of change. As such, her goal is to continue with weight loss efforts. She has agreed to follow the Category 3 plan. Madison [name] has been instructed to work up to a goal of 150 minutes of combined cardio and strengthening exercise per week for weight loss and overall health benefits. We discussed the following Behavioral Modification Strategies today: increasing lean protein intake, increasing vegetables, work on meal planning and easy cooking plans, and planning for success.  Madison [name] has agreed to follow up with our clinic in 2 weeks. She was informed of the importance of frequent follow up visits to maximize  her success with intensive lifestyle modifications for her multiple health conditions.   OBESITY BEHAVIORAL INTERVENTION VISIT  Today's visit was # 3   Starting weight: 231 lbs Starting date: 08/24/18 Today's weight : Weight: 224 lb (101.6 kg)  Today's date: 09/28/2018 Total lbs lost to date: 7  ASK: We discussed the diagnosis of obesity with Madison CornersEmily R [name] today and Madison [name] agreed to give us permission to discuss obesity behavioral modification therapy today.  ASSESS: Madison [name] has the diagnosis of obesity and her BMI today is 37.28. Madison [name] is not in the action stage of change.   ADVISE: Madison [name] was educated on the multiple health risks of obesity as well as the benefit of weight loss to improve her health. She was advised of the need for long term treatment and the importance of lifestyle modifications to improve her current health and to decrease her risk of future health problems.  AGREE: Multiple dietary modification options and treatment options were discussed and Madison [name] agreed to follow the recommendations documented in the above note.  ARRANGE: Madison [name] was educated on the importance of frequent visits to treat obesity as outlined per CMS and USPSTF guidelines and agreed to schedule her next follow up appointment today.  I, Kirke Corinara Soares, am acting as Energy managertranscriptionist for Filbert SchilderAlexandria U. Kadolph, MD  I have reviewed the above documentation for accuracy and completeness, and I agree with the above. - Debbra RidingAlexandria Kadolph, MD

## 2018-10-13 ENCOUNTER — Ambulatory Visit (INDEPENDENT_AMBULATORY_CARE_PROVIDER_SITE_OTHER): Payer: No Typology Code available for payment source | Admitting: Family Medicine

## 2018-10-13 ENCOUNTER — Encounter (INDEPENDENT_AMBULATORY_CARE_PROVIDER_SITE_OTHER): Payer: Self-pay

## 2018-10-21 ENCOUNTER — Telehealth: Payer: No Typology Code available for payment source | Admitting: Family

## 2018-10-21 DIAGNOSIS — L239 Allergic contact dermatitis, unspecified cause: Secondary | ICD-10-CM | POA: Diagnosis not present

## 2018-10-21 MED ORDER — PREDNISONE 10 MG (21) PO TBPK: ORAL_TABLET | ORAL | 0 refills | Status: DC

## 2018-10-21 MED FILL — predniSONE 10 MG TABS: 10 | 6 days supply | Qty: 21 | Fill #0

## 2018-10-21 NOTE — Progress Notes (Signed)
E Visit for Rash  We are sorry that you are not feeling well. Here is how we plan to help!  I have prescribed Prednisone 10 mg daily for 5 days  Prednisone 10 mg daily for 6 days (see taper instructions below)   HOME CARE:   Take cool showers and avoid direct sunlight.  Apply cool compress or wet dressings.  Take a bath in an oatmeal bath.  Sprinkle content of one Aveeno packet under running faucet with comfortably warm water.  Bathe for 15-20 minutes, 1-2 times daily.  Pat dry with a towel. Do not rub the rash.  Use hydrocortisone cream.  Take an antihistamine like Benadryl for widespread rashes that itch.  The adult dose of Benadryl is 25-50 mg by mouth 4 times daily.  Caution:  This type of medication may cause sleepiness.  Do not drink alcohol, drive, or operate dangerous machinery while taking antihistamines.  Do not take these medications if you have prostate enlargement.  Read package instructions thoroughly on all medications that you take.  GET HELP RIGHT AWAY IF:   Symptoms don't go away after treatment.  Severe itching that persists.  If you rash spreads or swells.  If you rash begins to smell.  If it blisters and opens or develops a yellow-brown crust.  You develop a fever.  You have a sore throat.  You become short of breath.  MAKE SURE YOU:  Understand these instructions. Will watch your condition. Will get help right away if you are not doing well or get worse.  Thank you for choosing an e-visit. Your e-visit answers were reviewed by a board certified advanced clinical practitioner to complete your personal care plan. Depending upon the condition, your plan could have included both over the counter or prescription medications. Please review your pharmacy choice. Be sure that the pharmacy you have chosen is open so that you can pick up your prescription now.  If there is a problem you may message your provider in MyChart to have the prescription routed  to another pharmacy. Your safety is important to us. If you have drug allergies check your prescription carefully.  For the next 24 hours, you can use MyChart to ask questions about today's visit, request a non-urgent call back, or ask for a work or school excuse from your e-visit provider. You will get an email in the next two days asking about your experience. I hope that your e-visit has been valuable and will speed your recovery.

## 2018-10-24 ENCOUNTER — Ambulatory Visit (INDEPENDENT_AMBULATORY_CARE_PROVIDER_SITE_OTHER)
Admission: RE | Admit: 2018-10-24 | Discharge: 2018-10-24 | Disposition: A | Discharge status: Home / Self Care | Payer: No Typology Code available for payment source | Source: Ambulatory Visit | Attending: Cardiology | Admitting: Cardiology

## 2018-10-24 DIAGNOSIS — J341 Cyst and mucocele of nose and nasal sinus: Secondary | ICD-10-CM | POA: Diagnosis not present

## 2018-10-31 MED FILL — DILTIAZEM 24HR ER 360 MG CA: 360 | 30 days supply | Qty: 30 | Fill #4 | Status: TO

## 2018-10-31 MED FILL — LEVOTHYROXINE 100 MCG TABLE: 100 | 30 days supply | Qty: 30 | Fill #2 | Status: TO

## 2018-10-31 MED FILL — MOMETASONE FUROATE 50 MCG S: 50 | 30 days supply | Qty: 17 | Fill #1 | Status: TO

## 2018-11-04 ENCOUNTER — Encounter: Payer: Self-pay | Admitting: Physician Assistant

## 2018-11-04 ENCOUNTER — Ambulatory Visit (INDEPENDENT_AMBULATORY_CARE_PROVIDER_SITE_OTHER): Payer: Self-pay | Admitting: Physician Assistant

## 2018-11-04 VITALS — BP 132/90 | HR 70 | Temp 98.1°F | Wt 231.0 lb

## 2018-11-04 DIAGNOSIS — R6889 Other general symptoms and signs: Secondary | ICD-10-CM

## 2018-11-04 DIAGNOSIS — J069 Acute upper respiratory infection, unspecified: Secondary | ICD-10-CM

## 2018-11-04 LAB — POCT RAPID STREP A (OFFICE): Rapid Strep A Screen: NEGATIVE

## 2018-11-04 LAB — POCT INFLUENZA A/B
Influenza A, POC: NEGATIVE
Influenza B, POC: NEGATIVE

## 2018-11-04 MED ORDER — AZELASTINE HCL 0.1 % NA SOLN: 2.0000 | Freq: Two times a day (BID) | NASAL | 0 refills | Status: DC

## 2018-11-04 NOTE — Progress Notes (Signed)
MRN: [id number] DOB: 04-21-1981  Subjective:   Madison [name] is a 37 y.o. female presenting for chief complaint of Sore Throat (ear pain, low grade fever x 2 days) .  Reports 2 day history of body aches, subjective fever, sore throat, nasal congestion, ear fullness, and sinus congestion. Has some mild headache. Denies productive cough, chest pain, SOB, N/V/D. Has tried ibuprofen with no full relief. Sick exposure to people with strep and flu.  PMH of seasonal allergies, takes claritin and nasal spray. PMH of SVT-cannot take sudafed.  Patient has had flu shot this season. Denies smoking. Denies any other aggravating or relieving factors, no other questions or concerns.  Review of Systems  Constitutional: Negative for diaphoresis.  Eyes: Negative for blurred vision and double vision.  Cardiovascular: Negative for palpitations.  Gastrointestinal: Negative for abdominal pain.  Skin: Negative for rash.  Neurological: Negative for dizziness.    Madison [name] has a current medication list which includes the following prescription(s): diltiazem, diltiazem, hydroxyzine, levothyroxine, loratadine, azelastine, pitavastatin calcium, prednisone, tri femynor, and vitamin d (ergocalciferol). Also is allergic to atorvastatin; crestor [rosuvastatin calcium]; latex; neosporin [neomycin-bacitracin zn-polymyx]; and toradol [ketorolac tromethamine].  Madison [name]  has a past medical history of Allergy, Anxiety, Asthma, Back pain, Constipation, Eczema, Exercise-induced bronchospasm (06/12/2016), GERD (gastroesophageal reflux disease), Herpes simplex disease, Hyperlipidemia, Hypothyroidism, Joint pain, Palpitation, SOB (shortness of breath), SVT (supraventricular tachycardia) (HCC) (11/30/2016), and Swelling. Also  has a past surgical history that includes Wisdom tooth extraction (Bilateral, 1997); Knee arthroscopy (Right, 2001); and Gum Grafting (11/2015, 06/2017).   Objective:   Vitals: BP 132/90 (BP Location: Right Arm, Patient  Position: Sitting)   Pulse 70   Temp 98.1 F (36.7 C) (Oral)   Wt 231 lb (104.8 kg)   SpO2 100%   BMI 38.44 kg/m   Physical Exam Vitals signs reviewed.  Constitutional:      General: She is not in acute distress.    Appearance: She is well-developed. She is not toxic-appearing.  HENT:     Head: Normocephalic and atraumatic.     Right Ear: Ear canal and external ear normal. A middle ear effusion is present. Tympanic membrane is not erythematous or bulging.     Left Ear: Ear canal and external ear normal. A middle ear effusion is present. Tympanic membrane is not erythematous or bulging.     Nose: Mucosal edema and congestion present.     Right Sinus: Maxillary sinus tenderness (mild) present. No frontal sinus tenderness.     Left Sinus: Maxillary sinus tenderness (mild) present. No frontal sinus tenderness.     Mouth/Throat:     Lips: Pink.     Mouth: Mucous membranes are moist.     Pharynx: Uvula midline. Posterior oropharyngeal erythema present. No uvula swelling.     Tonsils: No tonsillar exudate or tonsillar abscesses. Swelling: 1+ on the right. 1+ on the left.     Comments: Tonsils are erythematous b/l Eyes:     Conjunctiva/sclera: Conjunctivae normal.  Neck:     Musculoskeletal: Normal range of motion.  Cardiovascular:     Rate and Rhythm: Normal rate and regular rhythm.     Heart sounds: Normal heart sounds.  Pulmonary:     Effort: Pulmonary effort is normal.     Breath sounds: Normal breath sounds. No decreased breath sounds, wheezing, rhonchi or rales.  Lymphadenopathy:     Head:     Right side of head: No submental, submandibular, tonsillar, preauricular, posterior auricular or occipital adenopathy.  Left side of head: No submental, submandibular, tonsillar, preauricular, posterior auricular or occipital adenopathy.     Cervical: No cervical adenopathy.     Upper Body:     Right upper body: No supraclavicular adenopathy.     Left upper body: No supraclavicular  adenopathy.  Skin:    General: Skin is warm and dry.  Neurological:     Mental Status: She is alert.     Results for orders placed or performed in visit on 11/04/18 (from the past 24 hour(s))  POCT rapid strep A     Status: Normal   Collection Time: 11/04/18 12:31 PM  Result Value Ref Range   Rapid Strep A Screen Negative Negative  POCT Influenza A/B     Status: Normal   Collection Time: 11/04/18 12:31 PM  Result Value Ref Range   Influenza A, POC Negative Negative   Influenza B, POC Negative Negative    Assessment and Plan :  1. Viral upper respiratory tract infection Hx and PE findings consistent with viral URI. POC flu and strep test negative.  - Advised supportive care, offered symptomatic relief. -Contact office if no improvement in sinus congestion in 7-10 days. Return to clinic if sx worsen/develop new concerning sx.  - azelastine (ASTELIN) 0.1 % nasal spray; Place 2 sprays into both nostrils 2 (two) times daily. Use in each nostril as directed  Dispense: 30 mL; Refill: 0  2. Flu-like symptoms - POCT rapid strep A - POCT Influenza A/B   Benjiman CoreBrittany Thamar Holik, PA-C  [facility] 11/04/2018 12:40 PM

## 2018-11-04 NOTE — Patient Instructions (Addendum)
- Your flu and strep test were negative. We will treat this as a viral upper respiratory infection, more commonly known as the common cold.  Most colds last for three to seven days, although many people have residual symptoms such as coughing, sneezing, nasal or chest congestion for up to two weeks. Some viruses that cause the common cold can also depress the immune system or cause swelling in the lining of the nose or airways; this can, in turn, lead to a secondary viral or bacterial infection. Please let me know if you are not seeing any improvement in 7-10 days or sooner if any symptoms worsen or you develop new concerning symptoms.   -There is no specific treatment for the viruses that cause the common cold. Most treatments are aimed at relieving some of the symptoms of the cold but do not shorten or cure the cold. Antibiotics are not useful for treating the common cold; antibiotics are only used to treat illnesses caused by bacteria, not viruses. Unnecessary use of antibiotics for the treatment of the common cold can cause allergic reactions, diarrhea, or other gastrointestinal symptoms in some patients.  - For now, I recommend you rest, drink plenty of fluids, eat light meals, including soups, and use supportive measures discussed below.   Runny nose and nasal congestion -  Astelin nasal spray  Flonase nasal spary  Sore throat and headache -  Over the counter Tylenol, ibuprofen, Motrin or Aleve.  You may also use throat lozenges.  Tea recipe for sore throat: boil water, add 2 inches shaved ginger root, steep 15 minutes, add juice from 2 full lemons, and 2 tbsp honey.  If you develop a cough, I suggest over the counter Coricidin cough syrup.   Antibiotics should not be used to treat an uncomplicated common cold. As noted above, colds are caused by viruses. Antibiotics treat bacterial, not viral infections.  PREVENTION Hand washing is an essential and highly effective way to prevent the  spread of most infections, including the common cold. Hands should be wet with water and plain soap, and rubbed together for 15 to 30 seconds. Special attention should be paid to the fingernails, between the fingers, and the wrists. Hands should be rinsed thoroughly and dried with a single-use towel.In addition, tissues should be used to cover the mouth when sneezing or coughing. These used tissues should be disposed of promptly. Sneezing/coughing into the sleeve of one's clothing (at the inner elbow) does not contaminate the hands and is a good way of containing sprays of saliva and secretions.    Upper Respiratory Infection, Adult An upper respiratory infection (URI) affects the nose, throat, and upper air passages. URIs are caused by germs (viruses). The most common type of URI is often called "the common cold." Medicines cannot cure URIs, but you can do things at home to relieve your symptoms. URIs usually get better within 7-10 days. Follow these instructions at home: Activity  Rest as needed.  If you have a fever, stay home from work or school until your fever is gone, or until your doctor says you may return to work or school. ? You should stay home until you cannot spread the infection anymore (you are not contagious). ? Your doctor may have you wear a face mask so you have less risk of spreading the infection. Relieving symptoms  Gargle with a salt-water mixture 3-4 times a day or as needed. To make a salt-water mixture, completely dissolve -1 tsp of salt in 1  cup of warm water.  Use a cool-mist humidifier to add moisture to the air. This can help you breathe more easily. Eating and drinking   Drink enough fluid to keep your pee (urine) pale yellow.  Eat soups and other clear broths. General instructions   Take over-the-counter and prescription medicines only as told by your doctor. These include cold medicines, fever reducers, and cough suppressants.  Do not use any products  that contain nicotine or tobacco. These include cigarettes and e-cigarettes. If you need help quitting, ask your doctor.  Avoid being where people are smoking (avoid secondhand smoke).  Make sure you get regular shots and get the flu shot every year.  Keep all follow-up visits as told by your doctor. This is important. How to avoid spreading infection to others   Wash your hands often with soap and water. If you do not have soap and water, use hand sanitizer.  Avoid touching your mouth, face, eyes, or nose.  Cough or sneeze into a tissue or your sleeve or elbow. Do not cough or sneeze into your hand or into the air. Contact a doctor if:  You are getting worse, not better.  You have any of these: ? A fever. ? Chills. ? Brown or red mucus in your nose. ? Yellow or brown fluid (discharge)coming from your nose. ? Pain in your face, especially when you bend forward. ? Swollen neck glands. ? Pain with swallowing. ? White areas in the back of your throat. Get help right away if:  You have shortness of breath that gets worse.  You have very bad or constant: ? Headache. ? Ear pain. ? Pain in your forehead, behind your eyes, and over your cheekbones (sinus pain). ? Chest pain.  You have long-lasting (chronic) lung disease along with any of these: ? Wheezing. ? Long-lasting cough. ? Coughing up blood. ? A change in your usual mucus.  You have a stiff neck.  You have changes in your: ? Vision. ? Hearing. ? Thinking. ? Mood. Summary  An upper respiratory infection (URI) is caused by a germ called a virus. The most common type of URI is often called "the common cold."  URIs usually get better within 7-10 days.  Take over-the-counter and prescription medicines only as told by your doctor. This information is not intended to replace advice given to you by your health care provider. Make sure you discuss any questions you have with your health care provider. Document  Released: 04/13/2008 Document Revised: 06/18/2017 Document Reviewed: 06/18/2017 [employer]vier Interactive Patient Education  2019 [employer]

## 2018-11-07 ENCOUNTER — Encounter (INDEPENDENT_AMBULATORY_CARE_PROVIDER_SITE_OTHER): Payer: Self-pay | Admitting: Family Medicine

## 2018-11-10 ENCOUNTER — Encounter (INDEPENDENT_AMBULATORY_CARE_PROVIDER_SITE_OTHER): Payer: Self-pay

## 2018-11-10 ENCOUNTER — Ambulatory Visit (INDEPENDENT_AMBULATORY_CARE_PROVIDER_SITE_OTHER): Payer: No Typology Code available for payment source | Admitting: Family Medicine

## 2018-11-10 NOTE — Progress Notes (Signed)
Electrophysiology Office Note   Date:  11/11/2018   ID:  Madison [name], DOB 05-Jul-1981, MRN [id number]  PCP:  Milus Height, PA-C  Cardiologist:  Mayford Knife Primary Electrophysiologist:  Love Milbourne Jorja Loa, MD    No chief complaint on file.    History of Present Illness: Madison [name] is a 38 y.o. female who presents today for electrophysiology evaluation.   He has a history of hypothyroidism and SVT diagnosed 10 years ago. She presented to the ER on 1/16 2018 with palpitations and was found to be in SVT.  In ER her HR was 170 and spontaneously converted to NSR prior to ER MD seeing her. She had another episode of SVT at 210 bpm on the pulse ox 1 week prior and went to the ER and was given a single dose of Cardizem but was in NSR by the time an EKG could be done.  She tried vagal manuevers at home without any change.     Today, denies symptoms of palpitations, chest pain, shortness of breath, orthopnea, PND, lower extremity edema, claudication, dizziness, presyncope, syncope, bleeding, or neurologic sequela. The patient is tolerating medications without difficulties.  Overall she is feeling well.  She did have an episode of SVT that occurred when she was at jury duty.  This lasted for 20 seconds.  She feels this was due to her anxiety while being on jury duty.  Otherwise feels well.  She does say that she can get her heart rate very high when she works out but this does not bother her.   Past Medical History:  Diagnosis Date  . Allergy   . Anxiety   . Asthma   . Back pain   . Constipation   . Eczema   . Exercise-induced bronchospasm 06/12/2016  . GERD (gastroesophageal reflux disease)   . Herpes simplex disease   . Hyperlipidemia   . Hypothyroidism   . Joint pain   . Palpitation   . SOB (shortness of breath)   . SVT (supraventricular tachycardia) (HCC) 11/30/2016  . Swelling    bilat LE   Past Surgical History:  Procedure Laterality Date  . Gum Grafting  11/2015, 06/2017  .  KNEE ARTHROSCOPY Right 2001  . WISDOM TOOTH EXTRACTION Bilateral 1997     Current Outpatient Medications  Medication Sig Dispense Refill  . diltiazem (CARDIZEM CD) 360 MG 24 hr capsule Take 1 capsule (360 mg total) by mouth daily. 90 capsule 3  . diltiazem (CARDIZEM) 30 MG tablet Take 1 tablet (30 mg total) by mouth as needed (SVT EPISODES). 90 tablet 3  . hydrOXYzine (ATARAX/VISTARIL) 50 MG tablet TAKE 1 TO 2 TABLETS BY MOUTH EVERY 6 HOURS AS NEEDED FOR ITCHIN  1  . levothyroxine (SYNTHROID, LEVOTHROID) 100 MCG tablet Take 100 mcg by mouth daily before breakfast.    . loratadine (CLARITIN) 10 MG tablet Take 10 mg by mouth daily.    . mometasone (NASONEX) 50 MCG/ACT nasal spray Place 2 sprays into the nose daily.    . Pitavastatin Calcium (LIVALO PO) Take 2 mg by mouth daily.     . TRI FEMYNOR 0.18/0.215/0.25 MG-35 MCG tablet Take 1 tablet by mouth daily.  3   No current facility-administered medications for this visit.     Allergies:   Atorvastatin; Crestor [rosuvastatin calcium]; Latex; Neosporin [neomycin-bacitracin zn-polymyx]; and Toradol [ketorolac tromethamine]   Social History:  The patient  reports that she has never smoked. She has never used smokeless tobacco. She reports current  alcohol use. She reports that she does not use drugs.   Family History:  The patient's family history includes Eczema in her father; High Cholesterol in her mother; High blood pressure in her mother; Sleep apnea in her father; Thyroid disease in her mother.   ROS:  Please see the history of present illness.   Otherwise, review of systems is positive for none.   All other systems are reviewed and negative.   PHYSICAL EXAM: VS:  BP 112/80   Pulse 84   Ht 5\' 5"  (1.651 m)   Wt 222 lb (100.7 kg)   SpO2 98%   BMI 36.94 kg/m  , BMI Body mass index is 36.94 kg/m. GEN: Well nourished, well developed, in no acute distress  HEENT: normal  Neck: no JVD, carotid bruits, or masses Cardiac: RRR; no  murmurs, rubs, or gallops,no edema  Respiratory:  clear to auscultation bilaterally, normal work of breathing GI: soft, nontender, nondistended, + BS MS: no deformity or atrophy  Skin: warm and dry Neuro:  Strength and sensation are intact Psych: euthymic mood, full affect  EKG:  EKG is not ordered today. Personal review of the ekg ordered 08/23/18 shows sinus rhythm, rate 78  Recent Labs: 08/24/2018: ALT 11; BUN 11; Creatinine, Ser 0.75; Hemoglobin 13.8; Potassium 4.1; Sodium 141; TSH 2.030    Lipid Panel     Component Value Date/Time   CHOL 212 (H) 08/24/2018 1217   TRIG 118 08/24/2018 1217   HDL 78 08/24/2018 1217   LDLCALC 110 (H) 08/24/2018 1217     Wt Readings from Last 3 Encounters:  11/11/18 222 lb (100.7 kg)  11/04/18 231 lb (104.8 kg)  09/28/18 224 lb (101.6 kg)      Other studies Reviewed: Additional studies/ records that were reviewed today include: TTE 12/22/16, 30 day monitor 01/12/17  Review of the above records today demonstrates:  - Left ventricle: The cavity size was normal. Wall thickness was   normal. Systolic function was normal. The estimated ejection   fraction was in the range of 55% to 60%. Wall motion was normal;   there were no regional wall motion abnormalities. Left   ventricular diastolic function parameters were normal. - Mitral valve: There was mild regurgitation. - Left atrium: The atrium was mildly dilated.    Normal sinus rhythm with average heart rate 87bpm. The heart rate ranged from 61 to 175bpm.  SVT - likely sinus tachycardia up to 175bpm  Rare PAC and PVC   ASSESSMENT AND PLAN:  1.  SVT: On high dose of diltiazem with good control her SVT.  She does have 30 mg tabs but she has not taken it much at all.  She has had no further prolonged episodes.  We Rosalva Neary make no changes at this time.     Current medicines are reviewed at length with the patient today.   The patient does not have concerns regarding her medicines.  The  following changes were made today: None  Labs/ tests ordered today include:  No orders of the defined types were placed in this encounter.    Disposition:   FU with Ramiz Turpin 12 months  Signed, Hensley Aziz Jorja LoaMartin Sheritha Louis, MD  11/11/2018 8:57 AM     [facility] [address] [place] [address] [phone] (office) [phone] (fax)

## 2018-11-11 ENCOUNTER — Ambulatory Visit: Payer: BLUE CROSS/BLUE SHIELD | Admitting: Cardiology

## 2018-11-11 ENCOUNTER — Encounter: Payer: Self-pay | Admitting: Cardiology

## 2018-11-11 VITALS — BP 112/80 | HR 84 | Ht 65.0 in | Wt 222.0 lb

## 2018-11-11 DIAGNOSIS — I471 Supraventricular tachycardia: Secondary | ICD-10-CM | POA: Diagnosis not present

## 2018-11-11 MED FILL — TRIAMCINOLONE 0.5% CREAM: 0.5 | 30 days supply | Qty: 15 | Fill #0

## 2018-11-11 MED FILL — LIVALO 2 MG TABLET: 2 | 30 days supply | Qty: 30 | Fill #3 | Status: TO

## 2018-11-11 NOTE — Patient Instructions (Signed)
Medication Instructions:  Your physician recommends that you continue on your current medications as directed. Please refer to the Current Medication list given to you today.  * If you need a refill on your cardiac medications before your next appointment, please call your pharmacy.   Labwork: None ordered  Testing/Procedures: None ordered  Follow-Up: Your physician wants you to follow-up in: 1 year with Dr. Camnitz.  You will receive a reminder letter in the mail two months in advance. If you don't receive a letter, please call our office to schedule the follow-up appointment.  Thank you for choosing [facility]!!   Lyllian Gause, RN [phone]        

## 2018-11-23 ENCOUNTER — Ambulatory Visit (INDEPENDENT_AMBULATORY_CARE_PROVIDER_SITE_OTHER): Payer: No Typology Code available for payment source | Admitting: Family Medicine

## 2018-11-23 ENCOUNTER — Encounter (INDEPENDENT_AMBULATORY_CARE_PROVIDER_SITE_OTHER): Payer: Self-pay | Admitting: Family Medicine

## 2018-11-23 VITALS — BP 113/73 | HR 76 | Temp 98.6°F | Ht 65.0 in | Wt 227.0 lb

## 2018-11-23 DIAGNOSIS — K5900 Constipation, unspecified: Secondary | ICD-10-CM

## 2018-11-23 DIAGNOSIS — E038 Other specified hypothyroidism: Secondary | ICD-10-CM

## 2018-11-23 DIAGNOSIS — Z6837 Body mass index (BMI) 37.0-37.9, adult: Secondary | ICD-10-CM

## 2018-11-23 NOTE — Progress Notes (Signed)
Office: [phone]  /  Fax: [phone]   HPI:   Chief Complaint: OBESITY Madison [name] is here to discuss her progress with her obesity treatment plan. She is on the Category 3 plan and is following her eating plan approximately 75 % of the time. She states she is doing [employer] 60 minutes 4 to 5 times per week. Madison [name] reports feeling disappointed with weight gain. She is eating on her plan with increased indulgences. She admits hunger after exercising. She is the most hungry in the afternoon between 3-4pm. Madison [name] is then unable to eat all of her dinner because she has waited so long to eat.   Her weight is 227 lb (103 kg) today and has had a weight gain of 3 pounds over a period of 8 weeks since her last visit. She has lost 4 lbs since starting treatment with [place].  Constipation Madison [name] notes constipation for the last few weeks, worse since attempting weight loss. She states BM are less frequent. She has bought [employer].  Hypothyroid Madison [name] has a diagnosis of hypothyroidism. She is on Synthroid . She denies hot or cold intolerance or palpitations, but does admit to ongoing fatigue.  ASSESSMENT AND PLAN:  Constipation, unspecified constipation type  Other specified hypothyroidism  Class 2 severe obesity with serious comorbidity and body mass index (BMI) of 37.0 to 37.9 in adult, unspecified obesity type (HCC)  PLAN:  Constipation Madison [name] was informed decrease bowel movement frequency is normal while losing weight, but stools should not be hard or painful. She was advised to increase her H20 intake and work on increasing her fiber intake. High fiber foods were discussed today. She agrees to start Benefiber. If there is no relief after 3 to 4 days, she will start a stool softener. We will draw labs at her next appointment in 2 weeks. Madison [name] agrees with this plan.  Hypothyroid Madison [name] was informed of the importance of good thyroid control to help with weight loss efforts. She was also  informed that supertherapeutic thyroid levels are dangerous and will not improve weight loss results. She agrees to continue Synthroid and she will follow up in 2 weeks. We will draw labs at her next appointment.  I spent > than 50% of the 15 minute visit on counseling as documented in the note.  Obesity Madison [name] is currently in the action stage of change. As such, her goal is to continue with weight loss efforts. She has agreed to follow the Category 3 plan. Madison [name] has been instructed to work up to a goal of 150 minutes of combined cardio and strengthening exercise per week for weight loss and overall health benefits. We discussed the following Behavioral Modification Strategies today: increasing lean protein intake, increasing vegetables, planning for success, and work on meal planning and easy cooking plans.  Madison [name] has agreed to follow up with our clinic in 2 weeks. She was informed of the importance of frequent follow up visits to maximize her success with intensive lifestyle modifications for her multiple health conditions.  ALLERGIES: Allergies  Allergen Reactions  . Atorvastatin Other (See Comments)    Joint pain  . Crestor [Rosuvastatin Calcium] Swelling  . Latex Rash  . Neosporin [Neomycin-Bacitracin Zn-Polymyx] Rash  . Toradol [Ketorolac Tromethamine] Rash    MEDICATIONS: Current Outpatient Medications on File Prior to Visit  Medication Sig Dispense Refill  . diltiazem (CARDIZEM CD) 360 MG 24 hr capsule Take 1 capsule (360 mg total) by mouth daily. 90 capsule 3  . diltiazem (CARDIZEM) 30  MG tablet Take 1 tablet (30 mg total) by mouth as needed (SVT EPISODES). 90 tablet 3  . hydrOXYzine (ATARAX/VISTARIL) 50 MG tablet TAKE 1 TO 2 TABLETS BY MOUTH EVERY 6 HOURS AS NEEDED FOR ITCHIN  1  . levothyroxine (SYNTHROID, LEVOTHROID) 100 MCG tablet Take 100 mcg by mouth daily before breakfast.    . loratadine (CLARITIN) 10 MG tablet Take 10 mg by mouth daily.    . mometasone (NASONEX) 50  MCG/ACT nasal spray Place 2 sprays into the nose daily.    . Pitavastatin Calcium (LIVALO PO) Take 2 mg by mouth daily.     . TRI FEMYNOR 0.18/0.215/0.25 MG-35 MCG tablet Take 1 tablet by mouth daily.  3   No current facility-administered medications on file prior to visit.     PAST MEDICAL HISTORY: Past Medical History:  Diagnosis Date  . Allergy   . Anxiety   . Asthma   . Back pain   . Constipation   . Eczema   . Exercise-induced bronchospasm 06/12/2016  . GERD (gastroesophageal reflux disease)   . Herpes simplex disease   . Hyperlipidemia   . Hypothyroidism   . Joint pain   . Palpitation   . SOB (shortness of breath)   . SVT (supraventricular tachycardia) (HCC) 11/30/2016  . Swelling    bilat LE    PAST SURGICAL HISTORY: Past Surgical History:  Procedure Laterality Date  . Gum Grafting  11/2015, 06/2017  . KNEE ARTHROSCOPY Right 2001  . WISDOM TOOTH EXTRACTION Bilateral 1997    SOCIAL HISTORY: Social History   Tobacco Use  . Smoking status: Never Smoker  . Smokeless tobacco: Never Used  Substance Use Topics  . Alcohol use: Yes  . Drug use: No    FAMILY HISTORY: Family History  Problem Relation Age of Onset  . Eczema Father   . Sleep apnea Father   . High blood pressure Mother   . High Cholesterol Mother   . Thyroid disease Mother   . Allergic rhinitis Neg Hx   . Angioedema Neg Hx   . Asthma Neg Hx   . Atopy Neg Hx   . Immunodeficiency Neg Hx   . Urticaria Neg Hx     ROS: Review of Systems  Constitutional: Positive for malaise/fatigue. Negative for weight loss.  Cardiovascular: Negative for palpitations.  Gastrointestinal: Positive for constipation.  Endo/Heme/Allergies:       Negative for heat intolerance. Negative for cold intolerance.    PHYSICAL EXAM: Blood pressure 113/73, pulse 76, temperature 98.6 F (37 C), temperature source Oral, height 5\' 5"  (1.651 m), weight 227 lb (103 kg), SpO2 98 %. Body mass index is 37.77 kg/m. Physical  Exam Vitals signs reviewed.  Constitutional:      Appearance: Normal appearance. She is obese.  Cardiovascular:     Rate and Rhythm: Normal rate.  Pulmonary:     Effort: Pulmonary effort is normal.  Musculoskeletal: Normal range of motion.  Skin:    General: Skin is warm and dry.  Neurological:     Mental Status: She is alert and oriented to person, place, and time.  Psychiatric:        Mood and Affect: Mood normal.        Behavior: Behavior normal.     RECENT LABS AND TESTS: BMET    Component Value Date/Time   NA 141 08/24/2018 1217   K 4.1 08/24/2018 1217   CL 101 08/24/2018 1217   CO2 22 08/24/2018 1217   GLUCOSE 83  08/24/2018 1217   BUN 11 08/24/2018 1217   CREATININE 0.75 08/24/2018 1217   CALCIUM 9.2 08/24/2018 1217   GFRNONAA 102 08/24/2018 1217   GFRAA 118 08/24/2018 1217   Lab Results  Component Value Date   HGBA1C 5.2 08/24/2018   Lab Results  Component Value Date   INSULIN 6.2 08/24/2018   CBC    Component Value Date/Time   WBC 7.3 08/24/2018 1217   RBC 4.39 08/24/2018 1217   HGB 13.8 08/24/2018 1217   HCT 40.3 08/24/2018 1217   MCV 92 08/24/2018 1217   MCH 31.4 08/24/2018 1217   MCHC 34.2 08/24/2018 1217   RDW 12.3 08/24/2018 1217   LYMPHSABS 2.0 08/24/2018 1217   EOSABS 0.2 08/24/2018 1217   BASOSABS 0.1 08/24/2018 1217   Iron/TIBC/Ferritin/ %Sat No results found for: IRON, TIBC, FERRITIN, IRONPCTSAT Lipid Panel     Component Value Date/Time   CHOL 212 (H) 08/24/2018 1217   TRIG 118 08/24/2018 1217   HDL 78 08/24/2018 1217   LDLCALC 110 (H) 08/24/2018 1217   Hepatic Function Panel     Component Value Date/Time   PROT 7.1 08/24/2018 1217   ALBUMIN 4.6 08/24/2018 1217   AST 18 08/24/2018 1217   ALT 11 08/24/2018 1217   ALKPHOS 64 08/24/2018 1217   BILITOT 0.4 08/24/2018 1217      Component Value Date/Time   TSH 2.030 08/24/2018 1217   TSH 4.214 11/24/2016 0400   Results for Madison [name], Madison [name] (MRN [id number]) as of 11/24/2018  06:35  Ref. Range 08/24/2018 12:17  Vitamin D, 25-Hydroxy Latest Ref Range: 30.0 - 100.0 ng/mL 39.8     OBESITY BEHAVIORAL INTERVENTION VISIT  Today's visit was # 4   Starting weight: 231 Starting date: 08/24/18 Today's weight : Weight: 227 lb (103 kg)  Today's date: 11/23/2018 Total lbs lost to date: 4  ASK: We discussed the diagnosis of obesity with Madison CornersEmily [name] Kilpatrick today and [name] agreed to give us permission to discuss obesity behavioral modification therapy today.  ASSESS: [name] has the diagnosis of obesity and her BMI today is 37.7. [name] is in the action stage of change.   ADVISE: [name] was educated on the multiple health risks of obesity as well as the benefit of weight loss to improve her health. She was advised of the need for long term treatment and the importance of lifestyle modifications to improve her current health and to decrease her risk of future health problems.  AGREE: Multiple dietary modification options and treatment options were discussed and [name] agreed to follow the recommendations documented in the above note.  ARRANGE: [name] was educated on the importance of frequent visits to treat obesity as outlined per CMS and USPSTF guidelines and agreed to schedule her next follow up appointment today.  I, Kirke Corinara Soares, am acting as Energy managertranscriptionist for Filbert SchilderAlexandria U. Kadolph, MD  I have reviewed the above documentation for accuracy and completeness, and I agree with the above. - Debbra RidingAlexandria Kadolph, MD

## 2018-12-01 MED FILL — DILTIAZEM 24HR ER 360 MG CA: 360 | 30 days supply | Qty: 30 | Fill #5 | Status: TO

## 2018-12-01 MED FILL — LEVOTHYROXINE 100 MCG TABLE: 100 | 30 days supply | Qty: 30 | Fill #3 | Status: TO

## 2018-12-14 ENCOUNTER — Ambulatory Visit (INDEPENDENT_AMBULATORY_CARE_PROVIDER_SITE_OTHER): Payer: No Typology Code available for payment source | Admitting: Family Medicine

## 2018-12-14 ENCOUNTER — Encounter (INDEPENDENT_AMBULATORY_CARE_PROVIDER_SITE_OTHER): Payer: Self-pay | Admitting: Family Medicine

## 2018-12-14 VITALS — BP 106/71 | HR 73 | Temp 98.0°F | Ht 65.0 in | Wt 226.0 lb

## 2018-12-14 DIAGNOSIS — Z9189 Other specified personal risk factors, not elsewhere classified: Secondary | ICD-10-CM | POA: Diagnosis not present

## 2018-12-14 DIAGNOSIS — E038 Other specified hypothyroidism: Secondary | ICD-10-CM | POA: Diagnosis not present

## 2018-12-14 DIAGNOSIS — Z6837 Body mass index (BMI) 37.0-37.9, adult: Secondary | ICD-10-CM

## 2018-12-14 DIAGNOSIS — E559 Vitamin D deficiency, unspecified: Secondary | ICD-10-CM

## 2018-12-14 DIAGNOSIS — E7849 Other hyperlipidemia: Secondary | ICD-10-CM

## 2018-12-14 DIAGNOSIS — E66812 Obesity, class 2: Secondary | ICD-10-CM

## 2018-12-14 DIAGNOSIS — E8881 Metabolic syndrome: Secondary | ICD-10-CM

## 2018-12-14 DIAGNOSIS — E88819 Insulin resistance, unspecified: Secondary | ICD-10-CM

## 2018-12-14 NOTE — Progress Notes (Signed)
Office: [phone]  /  Fax: [phone]   HPI:   Chief Complaint: OBESITY Madison [name] is here to discuss her progress with her obesity treatment plan. She is on the Category 3 plan and is following her eating plan approximately 100 % of the time. She states she is doing orange theory for 60 minutes 4 times per week. Madison [name] reports hunger in the morning a few hours after breakfast. She has hunger after about 1 hour after snack. She is getting in 8 to 10 oz of meat at dinner.  Her weight is 226 lb (102.5 kg) today and has had a weight loss of 1 pound over a period of 3 weeks since her last visit. She has lost 5 lbs since starting treatment with [place].  Vitamin D Deficiency Madison [name] has a diagnosis of vitamin D deficiency. She is not currently taking Vit D. She notes fatigue and denies nausea, vomiting or muscle weakness.  Hyperlipidemia Madison [name] has hyperlipidemia and has been trying to improve her cholesterol levels with intensive lifestyle modification including a low saturated fat diet, exercise and weight loss. She is not on statin and LDL was 110. She denies any chest pain, claudication or myalgias.  Hypothyroidism Madison [name] has a diagnosis of hypothyroidism. She is on levothyroxine. She denies hot or cold intolerance or palpitations, but does admit to ongoing fatigue.  Insulin Resistance Madison [name] has a diagnosis of insulin resistance based on her elevated fasting insulin level >5. Although Madison [name]'s blood glucose readings are still under good control, insulin resistance puts her at greater risk of metabolic syndrome and diabetes. She notes carbohydrate cravings and she is not on medications. She continues to work on diet and exercise to decrease risk of diabetes.  At risk for diabetes Madison [name] is at higher than average risk for developing diabetes due to her obesity and insulin resistance. She currently denies polyuria or polydipsia.  ASSESSMENT AND PLAN:  Vitamin D deficiency - Plan: VITAMIN D 25 Hydroxy  (Vit-D Deficiency, Fractures)  Other hyperlipidemia - Plan: Lipid Panel With LDL/HDL Ratio  Other specified hypothyroidism - Plan: TSH  Insulin resistance - Plan: Insulin, random  At risk for diabetes mellitus  Class 2 severe obesity with serious comorbidity and body mass index (BMI) of 37.0 to 37.9 in adult, unspecified obesity type (HCC)  PLAN:  Vitamin D Deficiency Madison [name] was informed that low vitamin D levels contributes to fatigue and are associated with obesity, breast, and colon cancer. She will follow up for routine testing of vitamin D, at least 2-3 times per year. She was informed of the risk of over-replacement of vitamin D and agrees to not increase her dose unless she discusses this with [place] first. We will check Vit D level today. Madison [name] agrees to follow up with our clinic in 2 weeks.  Hyperlipidemia Madison [name] was informed of the American Heart Association Guidelines emphasizing intensive lifestyle modifications as the first line treatment for hyperlipidemia. We discussed many lifestyle modifications today in depth, and Madison [name] will continue to work on decreasing saturated fats such as fatty red meat, butter and many fried foods. She will also increase vegetables and lean protein in her diet and continue to work on exercise and weight loss efforts. We will check FLP today. Madison [name] agrees to follow up with our clinic in 2 weeks.  Hypothyroidism Madison [name] was informed of the importance of good thyroid control to help with weight loss efforts. She was also informed that supertheraputic thyroid levels are dangerous and will not improve weight loss results.  We will check TSH today. Madison [name] agrees to follow up with our clinic in 2 weeks.  Insulin Resistance Madison [name] will continue to work on weight loss, exercise, and decreasing simple carbohydrates in her diet to help decrease the risk of diabetes. We dicussed metformin including benefits and risks. She was informed that eating too many simple  carbohydrates or too many calories at one sitting increases the likelihood of GI side effects. Ginna declined metformin for now and prescription was not written today. We will check insulin today. Madison [name] agrees to follow up with our clinic in 2 weeks as directed to monitor her progress.  Diabetes risk counselling Madison [name] was given extended (15 minutes) diabetes prevention counseling today. She is 38 y.o. female and has risk factors for diabetes including obesity and insulin resistance. We discussed intensive lifestyle modifications today with an emphasis on weight loss as well as increasing exercise and decreasing simple carbohydrates in her diet.  Obesity Madison [name] is currently in the action stage of change. As such, her goal is to continue with weight loss efforts She has agreed to follow the Category 3 plan + 200 calories Madison [name] has been instructed to work up to a goal of 150 minutes of combined cardio and strengthening exercise per week for weight loss and overall health benefits. We discussed the following Behavioral Modification Strategies today: increasing lean protein intake, increasing vegetables and work on meal planning and easy cooking plans, and planning for success   Madison [name] has agreed to follow up with our clinic in 2 weeks. She was informed of the importance of frequent follow up visits to maximize her success with intensive lifestyle modifications for her multiple health conditions.  ALLERGIES: Allergies  Allergen Reactions  . Atorvastatin Other (See Comments)    Joint pain  . Crestor [Rosuvastatin Calcium] Swelling  . Latex Rash  . Neosporin [Neomycin-Bacitracin Zn-Polymyx] Rash  . Toradol [Ketorolac Tromethamine] Rash    MEDICATIONS: Current Outpatient Medications on File Prior to Visit  Medication Sig Dispense Refill  . diltiazem (CARDIZEM CD) 360 MG 24 hr capsule Take 1 capsule (360 mg total) by mouth daily. 90 capsule 3  . diltiazem (CARDIZEM) 30 MG tablet Take 1 tablet (30  mg total) by mouth as needed (SVT EPISODES). 90 tablet 3  . hydrOXYzine (ATARAX/VISTARIL) 50 MG tablet TAKE 1 TO 2 TABLETS BY MOUTH EVERY 6 HOURS AS NEEDED FOR ITCHIN  1  . levothyroxine (SYNTHROID, LEVOTHROID) 100 MCG tablet Take 100 mcg by mouth daily before breakfast.    . loratadine (CLARITIN) 10 MG tablet Take 10 mg by mouth daily.    . mometasone (NASONEX) 50 MCG/ACT nasal spray Place 2 sprays into the nose daily.    . Pitavastatin Calcium (LIVALO PO) Take 2 mg by mouth daily.     . TRI FEMYNOR 0.18/0.215/0.25 MG-35 MCG tablet Take 1 tablet by mouth daily.  3   No current facility-administered medications on file prior to visit.     PAST MEDICAL HISTORY: Past Medical History:  Diagnosis Date  . Allergy   . Anxiety   . Asthma   . Back pain   . Constipation   . Eczema   . Exercise-induced bronchospasm 06/12/2016  . GERD (gastroesophageal reflux disease)   . Herpes simplex disease   . Hyperlipidemia   . Hypothyroidism   . Joint pain   . Palpitation   . SOB (shortness of breath)   . SVT (supraventricular tachycardia) (HCC) 11/30/2016  . Swelling    bilat LE  PAST SURGICAL HISTORY: Past Surgical History:  Procedure Laterality Date  . Gum Grafting  11/2015, 06/2017  . KNEE ARTHROSCOPY Right 2001  . WISDOM TOOTH EXTRACTION Bilateral 1997    SOCIAL HISTORY: Social History   Tobacco Use  . Smoking status: Never Smoker  . Smokeless tobacco: Never Used  Substance Use Topics  . Alcohol use: Yes  . Drug use: No    FAMILY HISTORY: Family History  Problem Relation Age of Onset  . Eczema Father   . Sleep apnea Father   . High blood pressure Mother   . High Cholesterol Mother   . Thyroid disease Mother   . Allergic rhinitis Neg Hx   . Angioedema Neg Hx   . Asthma Neg Hx   . Atopy Neg Hx   . Immunodeficiency Neg Hx   . Urticaria Neg Hx     ROS: Review of Systems  Constitutional: Positive for malaise/fatigue and weight loss.  Cardiovascular: Negative for chest  pain, palpitations and claudication.  Gastrointestinal: Negative for nausea and vomiting.  Genitourinary: Negative for frequency.  Musculoskeletal: Negative for myalgias.       Negative muscle weakness  Endo/Heme/Allergies: Negative for polydipsia.       Negative hot/cold intolerance    PHYSICAL EXAM: Blood pressure 106/71, pulse 73, temperature 98 F (36.7 C), temperature source Oral, height 5\' 5"  (1.651 m), weight 226 lb (102.5 kg), last menstrual period 12/05/2018, SpO2 97 %. Body mass index is 37.61 kg/m. Physical Exam Vitals signs reviewed.  Constitutional:      Appearance: Normal appearance. She is obese.  Cardiovascular:     Rate and Rhythm: Normal rate.     Pulses: Normal pulses.  Pulmonary:     Effort: Pulmonary effort is normal.     Breath sounds: Normal breath sounds.  Musculoskeletal: Normal range of motion.  Skin:    General: Skin is warm and dry.  Neurological:     Mental Status: She is alert and oriented to person, place, and time.  Psychiatric:        Mood and Affect: Mood normal.        Behavior: Behavior normal.     RECENT LABS AND TESTS: BMET    Component Value Date/Time   NA 141 08/24/2018 1217   K 4.1 08/24/2018 1217   CL 101 08/24/2018 1217   CO2 22 08/24/2018 1217   GLUCOSE 83 08/24/2018 1217   BUN 11 08/24/2018 1217   CREATININE 0.75 08/24/2018 1217   CALCIUM 9.2 08/24/2018 1217   GFRNONAA 102 08/24/2018 1217   GFRAA 118 08/24/2018 1217   Lab Results  Component Value Date   HGBA1C 5.2 08/24/2018   Lab Results  Component Value Date   INSULIN 6.2 08/24/2018   CBC    Component Value Date/Time   WBC 7.3 08/24/2018 1217   RBC 4.39 08/24/2018 1217   HGB 13.8 08/24/2018 1217   HCT 40.3 08/24/2018 1217   MCV 92 08/24/2018 1217   MCH 31.4 08/24/2018 1217   MCHC 34.2 08/24/2018 1217   RDW 12.3 08/24/2018 1217   LYMPHSABS 2.0 08/24/2018 1217   EOSABS 0.2 08/24/2018 1217   BASOSABS 0.1 08/24/2018 1217   Iron/TIBC/Ferritin/ %Sat No  results found for: IRON, TIBC, FERRITIN, IRONPCTSAT Lipid Panel     Component Value Date/Time   CHOL 212 (H) 08/24/2018 1217   TRIG 118 08/24/2018 1217   HDL 78 08/24/2018 1217   LDLCALC 110 (H) 08/24/2018 1217   Hepatic Function Panel  Component Value Date/Time   PROT 7.1 08/24/2018 1217   ALBUMIN 4.6 08/24/2018 1217   AST 18 08/24/2018 1217   ALT 11 08/24/2018 1217   ALKPHOS 64 08/24/2018 1217   BILITOT 0.4 08/24/2018 1217      Component Value Date/Time   TSH 2.030 08/24/2018 1217   TSH 4.214 11/24/2016 0400      OBESITY BEHAVIORAL INTERVENTION VISIT  Today's visit was # 5   Starting weight: 231 lbs Starting date: 08/24/18 Today's weight : 226 lbs  Today's date: 12/14/2018 Total lbs lost to date: 5    ASK: We discussed the diagnosis of obesity with Lajoyce Corners today and Shyna agreed to give [place] permission to discuss obesity behavioral modification therapy today.  ASSESS: Sheyna has the diagnosis of obesity and her BMI today is 37.61 Solara is in the action stage of change   ADVISE: Markeita was educated on the multiple health risks of obesity as well as the benefit of weight loss to improve her health. She was advised of the need for long term treatment and the importance of lifestyle modifications to improve her current health and to decrease her risk of future health problems.  AGREE: Multiple dietary modification options and treatment options were discussed and  Keiarah agreed to follow the recommendations documented in the above note.  ARRANGE: Brittanee was educated on the importance of frequent visits to treat obesity as outlined per CMS and USPSTF guidelines and agreed to schedule her next follow up appointment today.  I, Burt Knack, am acting as transcriptionist for Debbra Riding, MD  I have reviewed the above documentation for accuracy and completeness, and I agree with the above. - Debbra Riding, MD

## 2018-12-15 LAB — LIPID PANEL WITH LDL/HDL RATIO
Cholesterol, Total: 198 mg/dL (ref 100–199)
HDL: 65 mg/dL (ref >39)
LDL Calculated: 105 mg/dL — ABNORMAL HIGH (ref 0–99)
LDl/HDL Ratio: 1.6 ratio (ref 0.0–3.2)
Triglycerides: 142 mg/dL (ref 0–149)
VLDL Cholesterol Cal: 28 mg/dL (ref 5–40)

## 2018-12-15 LAB — INSULIN, RANDOM: INSULIN: 9.6 u[IU]/mL (ref 2.6–24.9)

## 2018-12-15 LAB — TSH: TSH: 2.49 u[IU]/mL (ref 0.450–4.500)

## 2018-12-15 LAB — VITAMIN D 25 HYDROXY (VIT D DEFICIENCY, FRACTURES): Vit D, 25-Hydroxy: 27.9 ng/mL — ABNORMAL LOW (ref 30.0–100.0)

## 2018-12-20 IMAGING — CT CT MAXILLOFACIAL W/O CM
1 of 2 series · 9 of 12 positions shown, 12 images · non-contrast
Comparison: None.

CLINICAL DATA: Sinus retention cyst

EXAM:
CT MAXILLOFACIAL WITHOUT CONTRAST
TECHNIQUE: Multidetector CT imaging of the maxillofacial structures was
performed. Multiplanar CT image reconstructions were also generated.

[Series 4: limited sinus st · axial · 0.39mm/px · z∈[-8,+72]mm · 9 of 11 slices shown, 12 images]
[im 2/11  brain]
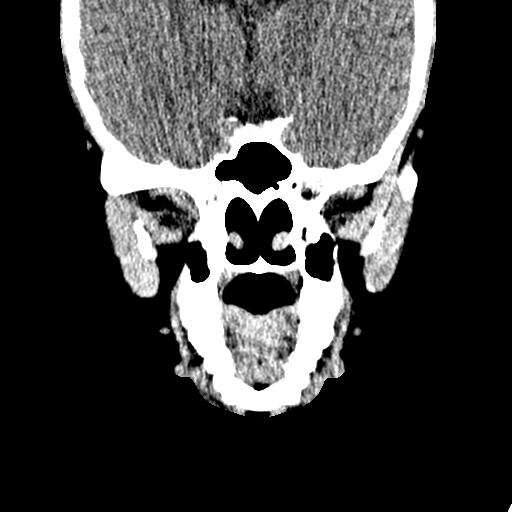
[im 2/11  bone]
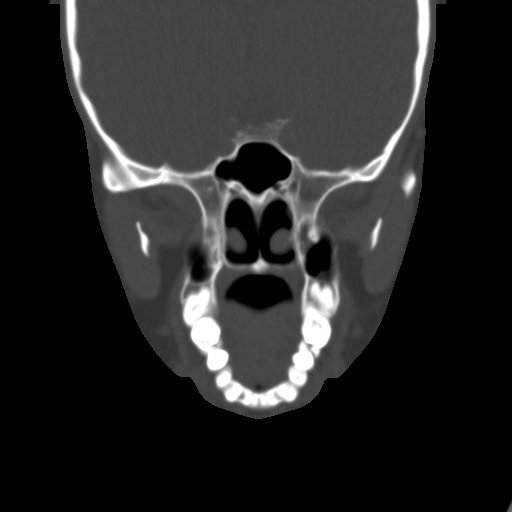
[im 3/11  bone]
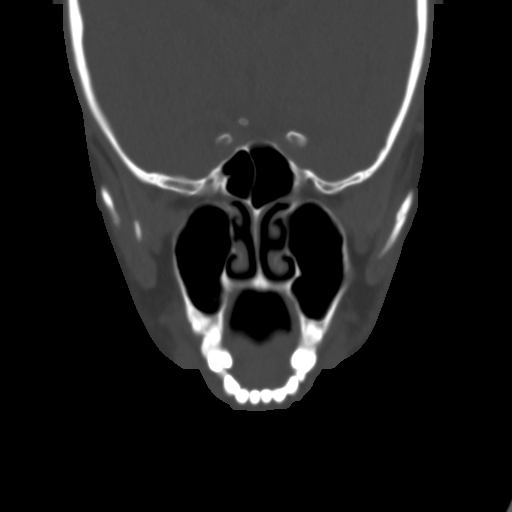
[im 4/11  bone]
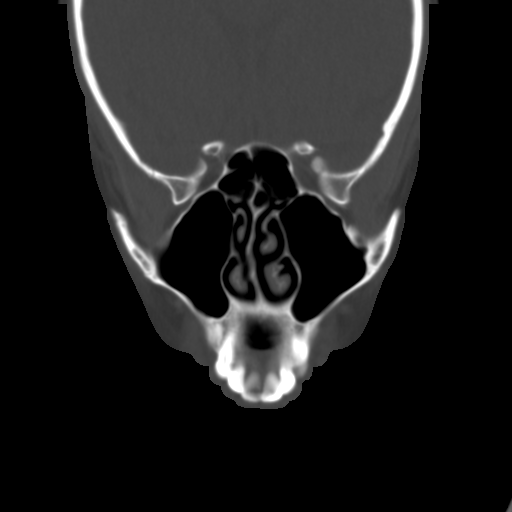
[im 5/11  bone]
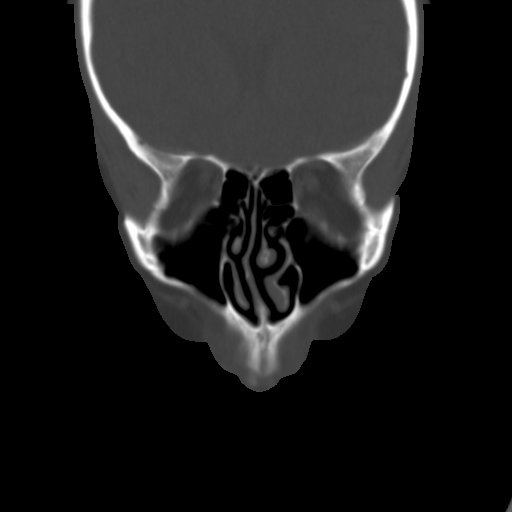
[im 6/11  brain]
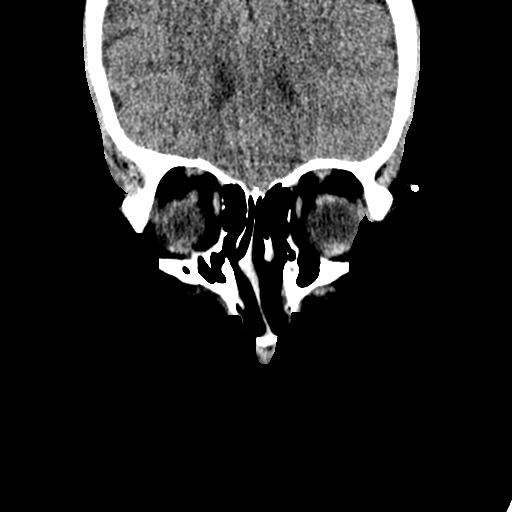
[im 6/11  bone]
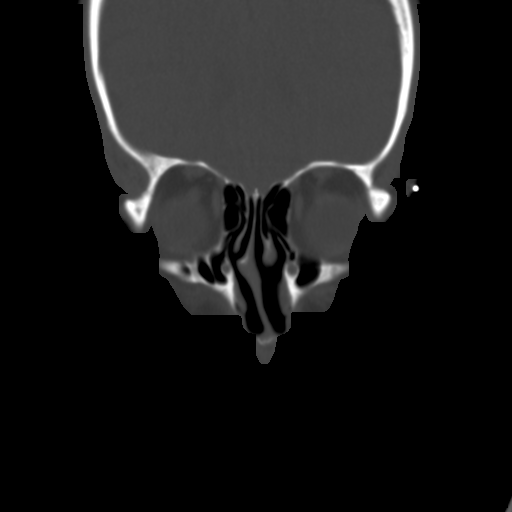
[im 7/11  bone]
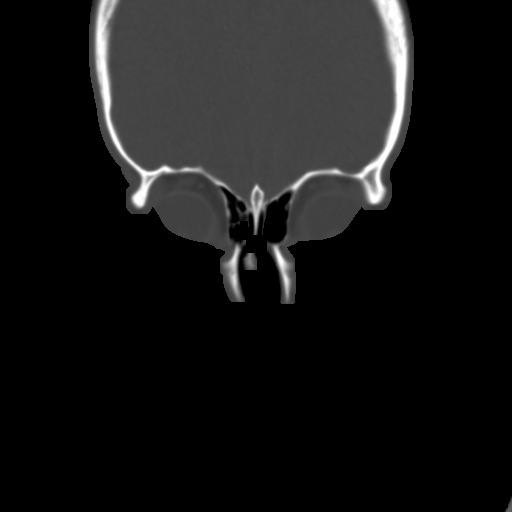
[im 8/11  bone]
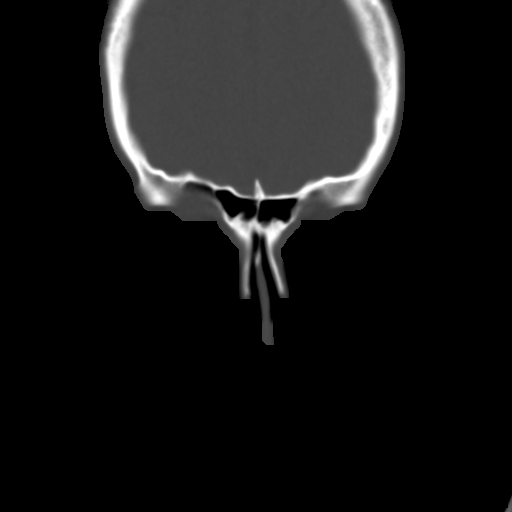
[im 9/11  bone]
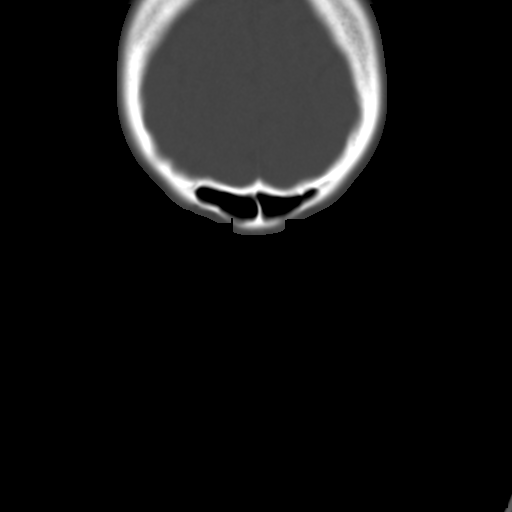
[im 10/11  brain]
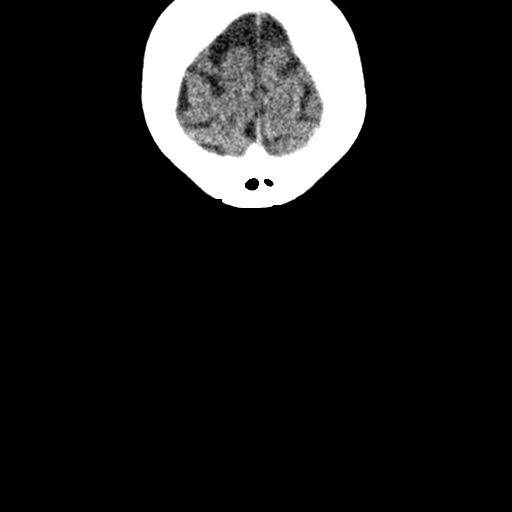
[im 10/11  bone]
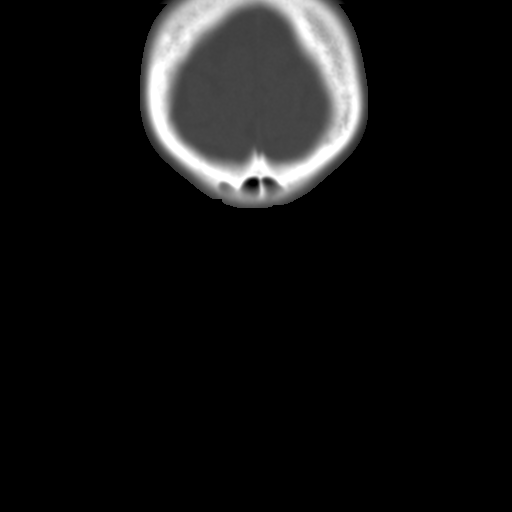

[9 of 12 positions shown; findings below may reference images not displayed]

FINDINGS: The paranasal sinuses are clear. Physical sinus drainage pathways
are patent. There is mild leftward deviation of the nasal septum.
The nasal passages are patent.
IMPRESSION: Clear paranasal sinuses.

## 2018-12-21 ENCOUNTER — Encounter (INDEPENDENT_AMBULATORY_CARE_PROVIDER_SITE_OTHER): Payer: Self-pay | Admitting: Family Medicine

## 2018-12-28 ENCOUNTER — Other Ambulatory Visit: Payer: Self-pay | Admitting: Cardiology

## 2018-12-28 ENCOUNTER — Ambulatory Visit (INDEPENDENT_AMBULATORY_CARE_PROVIDER_SITE_OTHER): Payer: No Typology Code available for payment source | Admitting: Family Medicine

## 2018-12-28 ENCOUNTER — Encounter (INDEPENDENT_AMBULATORY_CARE_PROVIDER_SITE_OTHER): Payer: Self-pay | Admitting: Family Medicine

## 2018-12-28 VITALS — BP 116/74 | HR 70 | Temp 98.0°F | Ht 65.0 in | Wt 226.0 lb

## 2018-12-28 DIAGNOSIS — E8881 Metabolic syndrome: Secondary | ICD-10-CM

## 2018-12-28 DIAGNOSIS — Z9189 Other specified personal risk factors, not elsewhere classified: Secondary | ICD-10-CM

## 2018-12-28 DIAGNOSIS — E559 Vitamin D deficiency, unspecified: Secondary | ICD-10-CM

## 2018-12-28 DIAGNOSIS — Z6837 Body mass index (BMI) 37.0-37.9, adult: Secondary | ICD-10-CM | POA: Diagnosis not present

## 2018-12-28 MED ORDER — VITAMIN D (ERGOCALCIFEROL) 1.25 MG (50000 UNIT) PO CAPS: 50000.0000 [IU] | ORAL_CAPSULE | ORAL | 0 refills | Status: DC

## 2018-12-28 MED ORDER — METFORMIN HCL 500 MG PO TABS: 500.0000 mg | ORAL_TABLET | Freq: Every day | ORAL | 0 refills | Status: DC

## 2018-12-28 MED FILL — LEVOTHYROXINE 100 MCG TABLE: 100 | 30 days supply | Qty: 30 | Fill #4 | Status: TO

## 2018-12-28 MED FILL — MOMETASONE FUROATE 50 MCG S: 50 | 30 days supply | Qty: 17 | Fill #2 | Status: TO

## 2018-12-28 MED FILL — DILTIAZEM 24HR ER 360 MG CA: 360 | 30 days supply | Qty: 30 | Fill #0 | Status: TO

## 2018-12-28 MED FILL — TRI FEMYNOR 28 TABLET: 0.18/0.215/ | 84 days supply | Qty: 84 | Fill #3 | Status: TO

## 2018-12-28 MED FILL — hydrOXYzine HCL 50 MG TABS: 50 | 12 days supply | Qty: 90 | Fill #1 | Status: TO

## 2018-12-29 ENCOUNTER — Other Ambulatory Visit (INDEPENDENT_AMBULATORY_CARE_PROVIDER_SITE_OTHER): Payer: Self-pay

## 2018-12-29 DIAGNOSIS — E8881 Metabolic syndrome: Secondary | ICD-10-CM

## 2018-12-29 DIAGNOSIS — E559 Vitamin D deficiency, unspecified: Secondary | ICD-10-CM

## 2018-12-29 MED ORDER — METFORMIN HCL 500 MG PO TABS: 500.0000 mg | ORAL_TABLET | Freq: Every day | ORAL | 0 refills | Status: DC

## 2018-12-29 MED ORDER — VITAMIN D (ERGOCALCIFEROL) 1.25 MG (50000 UNIT) PO CAPS: 50000.0000 [IU] | ORAL_CAPSULE | ORAL | 0 refills | Status: DC

## 2018-12-29 MED FILL — metFORMIN HCL 500 MG TABS: 500 | 30 days supply | Qty: 30 | Fill #0

## 2018-12-29 MED FILL — VIT D2 1.25 MG (50,000 UNIT: 1.25 MG | 84 days supply | Qty: 12 | Fill #0

## 2018-12-29 NOTE — Progress Notes (Signed)
Office: [phone]  /  Fax: [phone]   HPI:   Chief Complaint: OBESITY Madison [name] is here to discuss her progress with her obesity treatment plan. She is on the Category 3 plan + 200 calories and is following her eating plan approximately 100 % of the time. She states she is doing orange theory for 60 minutes 4 times per week. Madison [name] is hungry after lunch <2 hours. She finds a snack to help with her hunger but still hungry occasionally. She has no trips planned until mid April.  Her weight is 226 lb (102.5 kg) [name] and has not lost weight since her last visit. She has lost 5 lbs since starting treatment with [place].  Vitamin D Deficiency Madison [name] has a diagnosis of vitamin D deficiency. She is not currently taking Vit D. She notes fatigue and denies nausea, vomiting or muscle weakness.  Insulin Resistance Madison [name] has a diagnosis of insulin resistance based on her elevated fasting insulin level >5. Insulin of 9.6 and Hgb A1c of 5.2. Although Madison [name]'s blood glucose readings are still under good control, insulin resistance puts her at greater risk of metabolic syndrome and diabetes. She is not taking metformin currently and she notes hunger. She continues to work on diet and exercise to decrease risk of diabetes.  At risk for diabetes Madison [name] is at higher than average risk for developing diabetes due to her obesity and insulin resistance. She currently denies polyuria or polydipsia.  ASSESSMENT AND PLAN:  Vitamin D deficiency - Plan: DISCONTINUED: Vitamin D, Ergocalciferol, (DRISDOL) 1.25 MG (50000 UT) CAPS capsule  Insulin resistance - Plan: DISCONTINUED: metFORMIN (GLUCOPHAGE) 500 MG tablet  At risk for diabetes mellitus  Class 2 severe obesity with serious comorbidity and body mass index (BMI) of 37.0 to 37.9 in adult, unspecified obesity type (HCC)  PLAN:  Vitamin D Deficiency Madison [name] was informed that low vitamin D levels contributes to fatigue and are associated with obesity, breast, and  colon cancer. Madison [name] agrees to start prescription Vit D @50 ,000 IU every week #12 with no refills. She will follow up for routine testing of vitamin D, at least 2-3 times per year. She was informed of the risk of over-replacement of vitamin D and agrees to not increase her dose unless she discusses this with [place] first. Madison [name] agrees to follow up with our clinic in 2 weeks.   Insulin Resistance Madison [name] will continue to work on weight loss, exercise, and decreasing simple carbohydrates in her diet to help decrease the risk of diabetes. We dicussed metformin including benefits and risks. She was informed that eating too many simple carbohydrates or too many calories at one sitting increases the likelihood of GI side effects. Madison [name] agrees to start metformin 500 mg PO q AM #30 with no refills. Madison [name] agrees to follow up with our clinic in 2 weeks as directed to monitor her progress.  Diabetes risk counseling Madison [name] was given extended (15 minutes) diabetes prevention counseling [name]. She is 38 y.o. female and has risk factors for diabetes including obesity and insulin resistance. We discussed intensive lifestyle modifications [name] with an emphasis on weight loss as well as increasing exercise and decreasing simple carbohydrates in her diet.  Obesity Madison [name] is currently in the action stage of change. As such, her goal is to continue with weight loss efforts She has [name] to follow the Category 4 plan Madison [name] has been instructed to work up to a goal of 150 minutes of combined cardio and strengthening exercise per week for weight loss  and overall health benefits. We discussed the following Behavioral Modification Strategies [name]: increasing lean protein intake, increasing vegetables and work on meal planning and easy cooking plans, and planning for success   Madison [name] has [name] to follow up with our clinic in 2 weeks. She was informed of the importance of frequent follow up visits to maximize her success with  intensive lifestyle modifications for her multiple health conditions.  ALLERGIES: Allergies  Allergen Reactions  . Atorvastatin Other (See Comments)    Joint pain  . Crestor [Rosuvastatin Calcium] Swelling  . Latex Rash  . Neosporin [Neomycin-Bacitracin Zn-Polymyx] Rash  . Toradol [Ketorolac Tromethamine] Rash    MEDICATIONS: Current Outpatient Medications on File Prior to Visit  Medication Sig Dispense Refill  . diltiazem (CARDIZEM) 30 MG tablet Take 1 tablet (30 mg total) by mouth as needed (SVT EPISODES). 90 tablet 3  . hydrOXYzine (ATARAX/VISTARIL) 50 MG tablet TAKE 1 TO 2 TABLETS BY MOUTH EVERY 6 HOURS AS NEEDED FOR ITCHIN  1  . levothyroxine (SYNTHROID, LEVOTHROID) 100 MCG tablet Take 100 mcg by mouth daily before breakfast.    . loratadine (CLARITIN) 10 MG tablet Take 10 mg by mouth daily.    . mometasone (NASONEX) 50 MCG/ACT nasal spray Place 2 sprays into the nose daily.    . Pitavastatin Calcium (LIVALO PO) Take 2 mg by mouth daily.     . TRI FEMYNOR 0.18/0.215/0.25 MG-35 MCG tablet Take 1 tablet by mouth daily.  3   No current facility-administered medications on file prior to visit.     PAST MEDICAL HISTORY: Past Medical History:  Diagnosis Date  . Allergy   . Anxiety   . Asthma   . Back pain   . Constipation   . Eczema   . Exercise-induced bronchospasm 06/12/2016  . GERD (gastroesophageal reflux disease)   . Herpes simplex disease   . Hyperlipidemia   . Hypothyroidism   . Joint pain   . Palpitation   . SOB (shortness of breath)   . SVT (supraventricular tachycardia) (HCC) 11/30/2016  . Swelling    bilat LE    PAST SURGICAL HISTORY: Past Surgical History:  Procedure Laterality Date  . Gum Grafting  11/2015, 06/2017  . KNEE ARTHROSCOPY Right 2001  . WISDOM TOOTH EXTRACTION Bilateral 1997    SOCIAL HISTORY: Social History   Tobacco Use  . Smoking status: Never Smoker  . Smokeless tobacco: Never Used  Substance Use Topics  . Alcohol use: Yes  .  Drug use: No    FAMILY HISTORY: Family History  Problem Relation Age of Onset  . Eczema Father   . Sleep apnea Father   . High blood pressure Mother   . High Cholesterol Mother   . Thyroid disease Mother   . Allergic rhinitis Neg Hx   . Angioedema Neg Hx   . Asthma Neg Hx   . Atopy Neg Hx   . Immunodeficiency Neg Hx   . Urticaria Neg Hx     ROS: Review of Systems  Constitutional: Positive for malaise/fatigue. Negative for weight loss.  Gastrointestinal: Negative for nausea and vomiting.  Genitourinary: Negative for frequency.  Musculoskeletal:       Negative muscle weakness  Endo/Heme/Allergies: Negative for polydipsia.    PHYSICAL EXAM: Blood pressure 116/74, pulse 70, temperature 98 F (36.7 C), temperature source Oral, last menstrual period 12/05/2018, SpO2 99 %. There is no height or weight on file to calculate BMI. Physical Exam Vitals signs reviewed.  Constitutional:  Appearance: Normal appearance. She is obese.  Cardiovascular:     Rate and Rhythm: Normal rate.     Pulses: Normal pulses.  Pulmonary:     Effort: Pulmonary effort is normal.     Breath sounds: Normal breath sounds.  Musculoskeletal: Normal range of motion.  Skin:    General: Skin is warm and dry.  Neurological:     Mental Status: She is alert and oriented to person, place, and time.  Psychiatric:        Mood and Affect: Mood normal.        Behavior: Behavior normal.     RECENT LABS AND TESTS: BMET    Component Value Date/Time   NA 141 08/24/2018 1217   K 4.1 08/24/2018 1217   CL 101 08/24/2018 1217   CO2 22 08/24/2018 1217   GLUCOSE 83 08/24/2018 1217   BUN 11 08/24/2018 1217   CREATININE 0.75 08/24/2018 1217   CALCIUM 9.2 08/24/2018 1217   GFRNONAA 102 08/24/2018 1217   GFRAA 118 08/24/2018 1217   Lab Results  Component Value Date   HGBA1C 5.2 08/24/2018   Lab Results  Component Value Date   INSULIN 9.6 12/14/2018   INSULIN 6.2 08/24/2018   CBC    Component  Value Date/Time   WBC 7.3 08/24/2018 1217   RBC 4.39 08/24/2018 1217   HGB 13.8 08/24/2018 1217   HCT 40.3 08/24/2018 1217   MCV 92 08/24/2018 1217   MCH 31.4 08/24/2018 1217   MCHC 34.2 08/24/2018 1217   RDW 12.3 08/24/2018 1217   LYMPHSABS 2.0 08/24/2018 1217   EOSABS 0.2 08/24/2018 1217   BASOSABS 0.1 08/24/2018 1217   Iron/TIBC/Ferritin/ %Sat No results found for: IRON, TIBC, FERRITIN, IRONPCTSAT Lipid Panel     Component Value Date/Time   CHOL 198 12/14/2018 1000   TRIG 142 12/14/2018 1000   HDL 65 12/14/2018 1000   LDLCALC 105 (H) 12/14/2018 1000   Hepatic Function Panel     Component Value Date/Time   PROT 7.1 08/24/2018 1217   ALBUMIN 4.6 08/24/2018 1217   AST 18 08/24/2018 1217   ALT 11 08/24/2018 1217   ALKPHOS 64 08/24/2018 1217   BILITOT 0.4 08/24/2018 1217      Component Value Date/Time   TSH 2.490 12/14/2018 1000   TSH 2.030 08/24/2018 1217   TSH 4.214 11/24/2016 0400      OBESITY BEHAVIORAL INTERVENTION VISIT  [name]'s visit was # 6   Starting weight: 231 lbs Starting date: 08/24/18 [name]'s weight : 226 lbs   [name]'s date: 12/28/2018 Total lbs lost to date: 5    ASK: We discussed the diagnosis of obesity with Madison [name] and Madison [name] to give us permission to discuss obesity behavioral modification therapy [name].  ASSESS: Madison [name] has the diagnosis of obesity and her BMI [name] is 37.61 Madison [name] is in the action stage of change   ADVISE: Madison [name] was educated on the multiple health risks of obesity as well as the benefit of weight loss to improve her health. She was advised of the need for long term treatment and the importance of lifestyle modifications to improve her current health and to decrease her risk of future health problems.  AGREE: Multiple dietary modification options and treatment options were discussed and  Madison [name] to follow the recommendations documented in the above note.  ARRANGE: Madison [name] was educated on the  importance of frequent visits to treat obesity as outlined per CMS and USPSTF guidelines and [name] to  schedule her next follow up appointment [name].  I, Trixie Dredge, am acting as transcriptionist for Ilene Qua, MD  I have reviewed the above documentation for accuracy and completeness, and I agree with the above. - Ilene Qua, MD

## 2019-01-16 ENCOUNTER — Encounter (INDEPENDENT_AMBULATORY_CARE_PROVIDER_SITE_OTHER): Payer: Self-pay | Admitting: Family Medicine

## 2019-01-16 ENCOUNTER — Ambulatory Visit (INDEPENDENT_AMBULATORY_CARE_PROVIDER_SITE_OTHER): Payer: No Typology Code available for payment source | Admitting: Family Medicine

## 2019-01-16 VITALS — BP 115/73 | HR 68 | Ht 65.0 in | Wt 224.0 lb

## 2019-01-16 DIAGNOSIS — E559 Vitamin D deficiency, unspecified: Secondary | ICD-10-CM

## 2019-01-16 DIAGNOSIS — Z6837 Body mass index (BMI) 37.0-37.9, adult: Secondary | ICD-10-CM

## 2019-01-16 DIAGNOSIS — E8881 Metabolic syndrome: Secondary | ICD-10-CM | POA: Diagnosis not present

## 2019-01-16 NOTE — Progress Notes (Signed)
Office: [phone]  /  Fax: [phone]   HPI:   Chief Complaint: OBESITY Madison [name] is here to discuss her progress with her obesity treatment plan. She is on the Category 4 plan and is following her eating plan approximately 90 % of the time. She states she is doing [employer] and jogging for 20-30 minutes 4-5 times per week. Madison [name] voices a few episodes of diarrhea with metformin when she is eating off the plan. She notices improvement in hunger with metformin. She is not eating all snacks secondary to lack of hunger.  Her weight is 224 lb (101.6 kg) today and has had a weight loss of 2 pounds over a period of 2 to 3 weeks since her last visit. She has lost 7 lbs since starting treatment with [place].  Insulin Resistance Madison [name] has a diagnosis of insulin resistance based on her elevated fasting insulin level >5. Although Madison [name]'s blood glucose readings are still under good control, insulin resistance puts her at greater risk of metabolic syndrome and diabetes. She is taking metformin currently and notes significant improvement in hunger control and increased feeling of satiety. She continues to work on diet and exercise to decrease risk of diabetes.  Vitamin D Deficiency Madison [name] has a diagnosis of vitamin D deficiency. She is currently taking prescription Vit D. She notes slight improvement in fatigue and denies nausea, vomiting or muscle weakness.  ASSESSMENT AND PLAN:  Insulin resistance  Vitamin D deficiency  Class 2 severe obesity with serious comorbidity and body mass index (BMI) of 37.0 to 37.9 in adult, unspecified obesity type (HCC)  PLAN:  Insulin Resistance Madison [name] will continue to work on weight loss, exercise, and decreasing simple carbohydrates in her diet to help decrease the risk of diabetes. We dicussed metformin including benefits and risks. She was informed that eating too many simple carbohydrates or too many calories at one sitting increases the likelihood of GI side  effects. Madison [name] agrees to continue metformin, no refill needed, and she agrees to follow up with our clinic in 2 weeks as directed to monitor her progress.  Vitamin D Deficiency Madison [name] was informed that low vitamin D levels contributes to fatigue and are associated with obesity, breast, and colon cancer. Madison [name] agrees to continue taking prescription Vit D @50 ,000 IU every week, no refill needed. She will follow up for routine testing of vitamin D, at least 2-3 times per year. She was informed of the risk of over-replacement of vitamin D and agrees to not increase her dose unless she discusses this with [place] first. Madison [name] agrees to follow up with our clinic in 2 weeks.  I spent > than 50% of the 15 minute visit on counseling as documented in the note.  Obesity Madison [name] is currently in the action stage of change. As such, her goal is to continue with weight loss efforts She has agreed to follow the Category 3 plan with the option of 2 extra snacks Madison [name] has been instructed to work up to a goal of 150 minutes of combined cardio and strengthening exercise per week for weight loss and overall health benefits. We discussed the following Behavioral Modification Strategies today: increasing lean protein intake, increasing vegetables, work on meal planning and easy cooking plans, better snacking choices, and planning for success   Madison [name] has agreed to follow up with our clinic in 2 weeks. She was informed of the importance of frequent follow up visits to maximize her success with intensive lifestyle modifications for her multiple health conditions.  ALLERGIES: Allergies  Allergen Reactions  . Atorvastatin Other (See Comments)    Joint pain  . Crestor [Rosuvastatin Calcium] Swelling  . Latex Rash  . Neosporin [Neomycin-Bacitracin Zn-Polymyx] Rash  . Toradol [Ketorolac Tromethamine] Rash    MEDICATIONS: Current Outpatient Medications on File Prior to Visit  Medication Sig Dispense Refill  . diltiazem  (CARDIZEM) 30 MG tablet Take 1 tablet (30 mg total) by mouth as needed (SVT EPISODES). 90 tablet 3  . diltiazem (TIAZAC) 360 MG 24 hr capsule TAKE 1 CAPSULE (360 MG TOTAL) BY MOUTH DAILY. 90 capsule 3  . hydrOXYzine (ATARAX/VISTARIL) 50 MG tablet TAKE 1 TO 2 TABLETS BY MOUTH EVERY 6 HOURS AS NEEDED FOR ITCHIN  1  . levothyroxine (SYNTHROID, LEVOTHROID) 100 MCG tablet Take 100 mcg by mouth daily before breakfast.    . loratadine (CLARITIN) 10 MG tablet Take 10 mg by mouth daily.    . metFORMIN (GLUCOPHAGE) 500 MG tablet Take 1 tablet (500 mg total) by mouth daily with breakfast. 30 tablet 0  . mometasone (NASONEX) 50 MCG/ACT nasal spray Place 2 sprays into the nose daily.    . Pitavastatin Calcium (LIVALO PO) Take 2 mg by mouth daily.     . TRI FEMYNOR 0.18/0.215/0.25 MG-35 MCG tablet Take 1 tablet by mouth daily.  3  . Vitamin D, Ergocalciferol, (DRISDOL) 1.25 MG (50000 UT) CAPS capsule Take 1 capsule (50,000 Units total) by mouth every 7 (seven) days. 12 capsule 0   No current facility-administered medications on file prior to visit.     PAST MEDICAL HISTORY: Past Medical History:  Diagnosis Date  . Allergy   . Anxiety   . Asthma   . Back pain   . Constipation   . Eczema   . Exercise-induced bronchospasm 06/12/2016  . GERD (gastroesophageal reflux disease)   . Herpes simplex disease   . Hyperlipidemia   . Hypothyroidism   . Joint pain   . Palpitation   . SOB (shortness of breath)   . SVT (supraventricular tachycardia) (HCC) 11/30/2016  . Swelling    bilat LE    PAST SURGICAL HISTORY: Past Surgical History:  Procedure Laterality Date  . Gum Grafting  11/2015, 06/2017  . KNEE ARTHROSCOPY Right 2001  . WISDOM TOOTH EXTRACTION Bilateral 1997    SOCIAL HISTORY: Social History   Tobacco Use  . Smoking status: Never Smoker  . Smokeless tobacco: Never Used  Substance Use Topics  . Alcohol use: Yes  . Drug use: No    FAMILY HISTORY: Family History  Problem Relation Age of  Onset  . Eczema Father   . Sleep apnea Father   . High blood pressure Mother   . High Cholesterol Mother   . Thyroid disease Mother   . Allergic rhinitis Neg Hx   . Angioedema Neg Hx   . Asthma Neg Hx   . Atopy Neg Hx   . Immunodeficiency Neg Hx   . Urticaria Neg Hx     ROS: Review of Systems  Constitutional: Positive for malaise/fatigue and weight loss.  Gastrointestinal: Positive for diarrhea. Negative for nausea and vomiting.  Musculoskeletal:       Negative muscle weakness    PHYSICAL EXAM: Blood pressure 115/73, pulse 68, height 5\' 5"  (1.651 m), weight 224 lb (101.6 kg), last menstrual period 01/03/2019, SpO2 99 %. Body mass index is 37.28 kg/m. Physical Exam Vitals signs reviewed.  Constitutional:      Appearance: Normal appearance. She is obese.  Cardiovascular:  Rate and Rhythm: Normal rate.     Pulses: Normal pulses.  Pulmonary:     Effort: Pulmonary effort is normal.     Breath sounds: Normal breath sounds.  Musculoskeletal: Normal range of motion.  Skin:    General: Skin is warm and dry.  Neurological:     Mental Status: She is alert and oriented to person, place, and time.  Psychiatric:        Mood and Affect: Mood normal.        Behavior: Behavior normal.     RECENT LABS AND TESTS: BMET    Component Value Date/Time   NA 141 08/24/2018 1217   K 4.1 08/24/2018 1217   CL 101 08/24/2018 1217   CO2 22 08/24/2018 1217   GLUCOSE 83 08/24/2018 1217   BUN 11 08/24/2018 1217   CREATININE 0.75 08/24/2018 1217   CALCIUM 9.2 08/24/2018 1217   GFRNONAA 102 08/24/2018 1217   GFRAA 118 08/24/2018 1217   Lab Results  Component Value Date   HGBA1C 5.2 08/24/2018   Lab Results  Component Value Date   INSULIN 9.6 12/14/2018   INSULIN 6.2 08/24/2018   CBC    Component Value Date/Time   WBC 7.3 08/24/2018 1217   RBC 4.39 08/24/2018 1217   HGB 13.8 08/24/2018 1217   HCT 40.3 08/24/2018 1217   MCV 92 08/24/2018 1217   MCH 31.4 08/24/2018 1217    MCHC 34.2 08/24/2018 1217   RDW 12.3 08/24/2018 1217   LYMPHSABS 2.0 08/24/2018 1217   EOSABS 0.2 08/24/2018 1217   BASOSABS 0.1 08/24/2018 1217   Iron/TIBC/Ferritin/ %Sat No results found for: IRON, TIBC, FERRITIN, IRONPCTSAT Lipid Panel     Component Value Date/Time   CHOL 198 12/14/2018 1000   TRIG 142 12/14/2018 1000   HDL 65 12/14/2018 1000   LDLCALC 105 (H) 12/14/2018 1000   Hepatic Function Panel     Component Value Date/Time   PROT 7.1 08/24/2018 1217   ALBUMIN 4.6 08/24/2018 1217   AST 18 08/24/2018 1217   ALT 11 08/24/2018 1217   ALKPHOS 64 08/24/2018 1217   BILITOT 0.4 08/24/2018 1217      Component Value Date/Time   TSH 2.490 12/14/2018 1000   TSH 2.030 08/24/2018 1217   TSH 4.214 11/24/2016 0400      OBESITY BEHAVIORAL INTERVENTION VISIT  Today's visit was # 7   Starting weight: 231 lbs Starting date: 08/24/18 Today's weight : 224 lbs  Today's date: 01/16/2019 Total lbs lost to date: 7    01/16/2019  Height  (1.651 m)  Weight 224 lb (101.6 kg)  BMI (Calculated) 37.28  BLOOD PRESSURE - SYSTOLIC 115  BLOOD PRESSURE - DIASTOLIC 73   Body Fat % 43.9 %  Total Body Water (lbs) 87 lbs     ASK: We discussed the diagnosis of obesity with Lajoyce Corners today and Ashima agreed to give [place] permission to discuss obesity behavioral modification therapy today.  ASSESS: Jamilett has the diagnosis of obesity and her BMI today is 37.28 Rhett is in the action stage of change   ADVISE: Christean was educated on the multiple health risks of obesity as well as the benefit of weight loss to improve her health. She was advised of the need for long term treatment and the importance of lifestyle modifications to improve her current health and to decrease her risk of future health problems.  AGREE: Multiple dietary modification options and treatment options were discussed and  Marshea agreed  to follow the recommendations documented in the above note.  ARRANGE: [name] was  educated on the importance of frequent visits to treat obesity as outlined per CMS and USPSTF guidelines and agreed to schedule her next follow up appointment today.  I, Burt KnackSharon Martin, am acting as transcriptionist for Debbra RidingAlexandria Kadolph, MD  I have reviewed the above documentation for accuracy and completeness, and I agree with the above. - Debbra RidingAlexandria Kadolph, MD

## 2019-01-17 ENCOUNTER — Telehealth: Payer: Self-pay | Admitting: *Deleted

## 2019-01-17 MED ORDER — DILTIAZEM HCL 30 MG PO TABS: 30.0000 mg | ORAL_TABLET | ORAL | 3 refills | Status: DC | PRN

## 2019-01-17 MED ORDER — DILTIAZEM HCL ER COATED BEADS 240 MG PO CP24: 240.0000 mg | ORAL_CAPSULE | Freq: Every day | ORAL | 2 refills | Status: DC

## 2019-01-17 MED FILL — CARTIA XT 240 MG CAPSULE SA: 240 | 30 days supply | Qty: 30 | Fill #0 | Status: TO

## 2019-01-17 NOTE — Telephone Encounter (Signed)
Reducing Diltiazem to 240 mg once daily per [place] approval.  Pt baseline HR dropping, she is feeling fatigued. Pt will let us know if she begins to experience elevated HRs after medication change.  (she also has Diltiazem PRN and will use if needed) Pt agreeable to plan.

## 2019-01-17 NOTE — Addendum Note (Signed)
Addended by: Baird Lyons on: 01/17/2019 05:35 PM   Modules accepted: Orders

## 2019-01-18 MED FILL — dilTIAZem HCL 30 MG TABS: 30 | 30 days supply | Qty: 30 | Fill #0

## 2019-01-24 ENCOUNTER — Other Ambulatory Visit (INDEPENDENT_AMBULATORY_CARE_PROVIDER_SITE_OTHER): Payer: Self-pay | Admitting: Family Medicine

## 2019-01-24 DIAGNOSIS — E8881 Metabolic syndrome: Secondary | ICD-10-CM

## 2019-01-31 ENCOUNTER — Encounter (INDEPENDENT_AMBULATORY_CARE_PROVIDER_SITE_OTHER): Payer: Self-pay

## 2019-02-01 ENCOUNTER — Encounter (INDEPENDENT_AMBULATORY_CARE_PROVIDER_SITE_OTHER): Payer: Self-pay

## 2019-02-06 NOTE — Telephone Encounter (Signed)
Please address

## 2019-02-07 ENCOUNTER — Ambulatory Visit (INDEPENDENT_AMBULATORY_CARE_PROVIDER_SITE_OTHER): Payer: No Typology Code available for payment source | Admitting: Family Medicine

## 2019-02-09 ENCOUNTER — Encounter (INDEPENDENT_AMBULATORY_CARE_PROVIDER_SITE_OTHER): Payer: Self-pay | Admitting: Family Medicine

## 2019-02-13 ENCOUNTER — Ambulatory Visit (INDEPENDENT_AMBULATORY_CARE_PROVIDER_SITE_OTHER): Payer: No Typology Code available for payment source | Admitting: Family Medicine

## 2019-02-13 ENCOUNTER — Encounter (INDEPENDENT_AMBULATORY_CARE_PROVIDER_SITE_OTHER): Payer: Self-pay | Admitting: Family Medicine

## 2019-02-13 ENCOUNTER — Other Ambulatory Visit: Payer: Self-pay

## 2019-02-13 DIAGNOSIS — Z6837 Body mass index (BMI) 37.0-37.9, adult: Secondary | ICD-10-CM

## 2019-02-13 DIAGNOSIS — E8881 Metabolic syndrome: Secondary | ICD-10-CM

## 2019-02-13 DIAGNOSIS — E559 Vitamin D deficiency, unspecified: Secondary | ICD-10-CM | POA: Diagnosis not present

## 2019-02-13 MED ORDER — METFORMIN HCL 500 MG PO TABS: 500.0000 mg | ORAL_TABLET | Freq: Two times a day (BID) | ORAL | 0 refills | Status: DC

## 2019-02-13 NOTE — Progress Notes (Signed)
Office: [phone]  /  Fax: [phone] TeleHealth Visit:  Madison [name] has verbally consented to this TeleHealth visit today. The patient is located at home, the provider is located at the [facility] office. The participants in this visit include the listed provider and patient and provider's assistant. The visit was conducted today via face time.  HPI:   Chief Complaint: OBESITY Madison [name] is here to discuss her progress with her obesity treatment plan. She is on the Category 3 plan with the options of 2 extra snacks and is following her eating plan approximately 85-90 % of the time. She states she is walking, bike riding, and at home workouts from [employer]. Madison [name] is less hungry while working from home. She has been exercising at home secondary to COVID-19. She is doing 8 oz of meat at home and doing 70 calorie bread, but having a hard time getting bread so she is looking for options.  We were unable to weigh the patient today for this TeleHealth visit. She feels as if she has gained 2 lbs since her last visit. She has lost 7 lbs since starting treatment with [place].  Insulin Resistance Madison [name] has a diagnosis of insulin resistance based on her elevated fasting insulin level >5. Although Madison [name]'s blood glucose readings are still under good control, insulin resistance puts her at greater risk of metabolic syndrome and diabetes. She notes occasional carbohydrate cravings. She denies GI side effects of metformin except with indulgences in pasta. She continues to work on diet and exercise to decrease risk of diabetes.  Vitamin D Deficiency Madison [name] has a diagnosis of vitamin D deficiency. She is currently taking prescription Vit D. She notes fatigue and denies nausea, vomiting or muscle weakness.  ASSESSMENT AND PLAN:  Insulin resistance - Plan: metFORMIN (GLUCOPHAGE) 500 MG tablet  Vitamin D deficiency  Class 2 severe obesity with serious comorbidity and body mass index (BMI) of  37.0 to 37.9 in adult, unspecified obesity type (HCC)  PLAN:  Insulin Resistance Madison [name] will continue to work on weight loss, exercise, and decreasing simple carbohydrates in her diet to help decrease the risk of diabetes. We dicussed metformin including benefits and risks. She was informed that eating too many simple carbohydrates or too many calories at one sitting increases the likelihood of GI side effects. Madison [name] agrees to increase metformin to 500 mg PO BID #60 with no refills. Madison [name] agrees to follow up with our clinic in 2 weeks as directed to monitor her progress.  Vitamin D Deficiency Madison [name] was informed that low vitamin D levels contributes to fatigue and are associated with obesity, breast, and colon cancer. Madison [name] agrees to continue taking prescription Vit D @50 ,000 IU every week and will follow up for routine testing of vitamin D, at least 2-3 times per year. She was informed of the risk of over-replacement of vitamin D and agrees to not increase her dose unless she discusses this with [place] first. Madison [name] agrees to follow up with our clinic in 2 weeks.  Obesity Madison [name] is currently in the action stage of change. As such, her goal is to continue with weight loss efforts She has agreed to follow the Category 3 plan Madison [name] has been instructed to work up to a goal of 150 minutes of combined cardio and strengthening exercise per week for weight loss and overall health benefits. We discussed the following Behavioral Modification Strategies today: increasing lean protein intake, increasing vegetables, work on meal planning and easy cooking plans, and  planning for success   Madison [name] has agreed to follow up with our clinic in 2 weeks. She was informed of the importance of frequent follow up visits to maximize her success with intensive lifestyle modifications for her multiple health conditions.  ALLERGIES: Allergies  Allergen Reactions  . Atorvastatin Other (See Comments)    Joint pain  . Crestor  [Rosuvastatin Calcium] Swelling  . Latex Rash  . Neosporin [Neomycin-Bacitracin Zn-Polymyx] Rash  . Toradol [Ketorolac Tromethamine] Rash    MEDICATIONS: Current Outpatient Medications on File Prior to Visit  Medication Sig Dispense Refill  . diltiazem (CARDIZEM CD) 240 MG 24 hr capsule Take 1 capsule (240 mg total) by mouth daily. 30 capsule 2  . diltiazem (CARDIZEM) 30 MG tablet Take 1 tablet (30 mg total) by mouth as needed (SVT EPISODES). 30 tablet 3  . diltiazem (TIAZAC) 360 MG 24 hr capsule TAKE 1 CAPSULE (360 MG TOTAL) BY MOUTH DAILY. 90 capsule 3  . hydrOXYzine (ATARAX/VISTARIL) 50 MG tablet TAKE 1 TO 2 TABLETS BY MOUTH EVERY 6 HOURS AS NEEDED FOR ITCHIN  1  . levothyroxine (SYNTHROID, LEVOTHROID) 100 MCG tablet Take 100 mcg by mouth daily before breakfast.    . loratadine (CLARITIN) 10 MG tablet Take 10 mg by mouth daily.    . mometasone (NASONEX) 50 MCG/ACT nasal spray Place 2 sprays into the nose daily.    . Pitavastatin Calcium (LIVALO PO) Take 2 mg by mouth daily.     . TRI FEMYNOR 0.18/0.215/0.25 MG-35 MCG tablet Take 1 tablet by mouth daily.  3  . Vitamin D, Ergocalciferol, (DRISDOL) 1.25 MG (50000 UT) CAPS capsule Take 1 capsule (50,000 Units total) by mouth every 7 (seven) days. 12 capsule 0   No current facility-administered medications on file prior to visit.     PAST MEDICAL HISTORY: Past Medical History:  Diagnosis Date  . Allergy   . Anxiety   . Asthma   . Back pain   . Constipation   . Eczema   . Exercise-induced bronchospasm 06/12/2016  . GERD (gastroesophageal reflux disease)   . Herpes simplex disease   . Hyperlipidemia   . Hypothyroidism   . Joint pain   . Palpitation   . SOB (shortness of breath)   . SVT (supraventricular tachycardia) (HCC) 11/30/2016  . Swelling    bilat LE    PAST SURGICAL HISTORY: Past Surgical History:  Procedure Laterality Date  . Gum Grafting  11/2015, 06/2017  . KNEE ARTHROSCOPY Right 2001  . WISDOM TOOTH EXTRACTION  Bilateral 1997    SOCIAL HISTORY: Social History   Tobacco Use  . Smoking status: Never Smoker  . Smokeless tobacco: Never Used  Substance Use Topics  . Alcohol use: Yes  . Drug use: No    FAMILY HISTORY: Family History  Problem Relation Age of Onset  . Eczema Father   . Sleep apnea Father   . High blood pressure Mother   . High Cholesterol Mother   . Thyroid disease Mother   . Allergic rhinitis Neg Hx   . Angioedema Neg Hx   . Asthma Neg Hx   . Atopy Neg Hx   . Immunodeficiency Neg Hx   . Urticaria Neg Hx     ROS: Review of Systems  Constitutional: Positive for malaise/fatigue. Negative for weight loss.  Gastrointestinal: Negative for nausea and vomiting.  Musculoskeletal:       Negative muscle weakness    PHYSICAL EXAM: Pt in no acute distress  RECENT LABS AND TESTS:  BMET    Component Value Date/Time   NA 141 08/24/2018 1217   K 4.1 08/24/2018 1217   CL 101 08/24/2018 1217   CO2 22 08/24/2018 1217   GLUCOSE 83 08/24/2018 1217   BUN 11 08/24/2018 1217   CREATININE 0.75 08/24/2018 1217   CALCIUM 9.2 08/24/2018 1217   GFRNONAA 102 08/24/2018 1217   GFRAA 118 08/24/2018 1217   Lab Results  Component Value Date   HGBA1C 5.2 08/24/2018   Lab Results  Component Value Date   INSULIN 9.6 12/14/2018   INSULIN 6.2 08/24/2018   CBC    Component Value Date/Time   WBC 7.3 08/24/2018 1217   RBC 4.39 08/24/2018 1217   HGB 13.8 08/24/2018 1217   HCT 40.3 08/24/2018 1217   MCV 92 08/24/2018 1217   MCH 31.4 08/24/2018 1217   MCHC 34.2 08/24/2018 1217   RDW 12.3 08/24/2018 1217   LYMPHSABS 2.0 08/24/2018 1217   EOSABS 0.2 08/24/2018 1217   BASOSABS 0.1 08/24/2018 1217   Iron/TIBC/Ferritin/ %Sat No results found for: IRON, TIBC, FERRITIN, IRONPCTSAT Lipid Panel     Component Value Date/Time   CHOL 198 12/14/2018 1000   TRIG 142 12/14/2018 1000   HDL 65 12/14/2018 1000   LDLCALC 105 (H) 12/14/2018 1000   Hepatic Function Panel     Component  Value Date/Time   PROT 7.1 08/24/2018 1217   ALBUMIN 4.6 08/24/2018 1217   AST 18 08/24/2018 1217   ALT 11 08/24/2018 1217   ALKPHOS 64 08/24/2018 1217   BILITOT 0.4 08/24/2018 1217      Component Value Date/Time   TSH 2.490 12/14/2018 1000   TSH 2.030 08/24/2018 1217   TSH 4.214 11/24/2016 0400      I, Burt Knack, am acting as transcriptionist for Debbra Riding, MD  I have reviewed the above documentation for accuracy and completeness, and I agree with the above. - Debbra Riding, MD

## 2019-02-15 ENCOUNTER — Encounter (INDEPENDENT_AMBULATORY_CARE_PROVIDER_SITE_OTHER): Payer: Self-pay | Admitting: Family Medicine

## 2019-02-15 NOTE — Telephone Encounter (Signed)
Please advise 

## 2019-02-22 MED FILL — LEVOTHYROXINE 100 MCG TAB: 100 | 90 days supply | Qty: 90 | Fill #0 | Status: TO

## 2019-02-22 MED FILL — CARTIA XT 240 MG CAPSULE SA: 240 | 60 days supply | Qty: 60 | Fill #0

## 2019-02-22 MED FILL — TRI FEMYNOR 28 TABLET: 0.18/0.215/ | 84 days supply | Qty: 84 | Fill #0

## 2019-02-27 ENCOUNTER — Encounter (INDEPENDENT_AMBULATORY_CARE_PROVIDER_SITE_OTHER): Payer: Self-pay | Admitting: Family Medicine

## 2019-02-27 ENCOUNTER — Other Ambulatory Visit: Payer: Self-pay

## 2019-02-27 ENCOUNTER — Ambulatory Visit (INDEPENDENT_AMBULATORY_CARE_PROVIDER_SITE_OTHER): Payer: No Typology Code available for payment source | Admitting: Family Medicine

## 2019-02-27 DIAGNOSIS — Z6837 Body mass index (BMI) 37.0-37.9, adult: Secondary | ICD-10-CM | POA: Diagnosis not present

## 2019-02-27 DIAGNOSIS — E8881 Metabolic syndrome: Secondary | ICD-10-CM

## 2019-02-27 DIAGNOSIS — E559 Vitamin D deficiency, unspecified: Secondary | ICD-10-CM

## 2019-02-27 MED ORDER — LIRAGLUTIDE 18 MG/3ML ~~LOC~~ SOPN: 0.6000 mg | PEN_INJECTOR | SUBCUTANEOUS | 0 refills | Status: DC

## 2019-02-27 MED ORDER — INSULIN PEN NEEDLE 32G X 4 MM MISC: 1.0000 | Freq: Two times a day (BID) | 0 refills | Status: DC

## 2019-02-27 MED FILL — UNIFINE PENTIPS 32GX5/32": 32G X 4 MM | 50 days supply | Qty: 100 | Fill #0

## 2019-02-27 MED FILL — VICTOZA 18 MG/3 ML INJECT P: 18 | 30 days supply | Qty: 3 | Fill #0

## 2019-02-27 MED FILL — UNIFINE PENTIPS 32GX5/32: 32G X 4 MM | 50 days supply | Qty: 100 | Fill #0

## 2019-03-01 NOTE — Progress Notes (Signed)
Office: [phone]  /  Fax: [phone] TeleHealth Visit:  Madison [name] has verbally consented to this TeleHealth visit today. The patient is located at home, the provider is located at the [facility] office. The participants in this visit include the listed provider and patient. The visit was conducted today via face time.  HPI:   Chief Complaint: OBESITY Madison [name] is here to discuss her progress with her obesity treatment plan. She is on the Category 2 plan and is following her eating plan approximately 90 % of the time. She states she is doing orange theory, yoga, and walking for 30-60 minutes 5 times per week. Madison [name] is working from home. She is occasionally having to go in the office. She reports that food is coming back to the store and hasn't had to make any substitutions.  We were unable to weigh the patient today for this TeleHealth visit. She feels as if she has maintained her weight since her last visit. She has lost 7 lbs since starting treatment with us.  Insulin Resistance Madison [name] has a diagnosis of insulin resistance based on her elevated fasting insulin level >5. Although Brenya's blood glucose readings are still under good control, insulin resistance puts her at greater risk of metabolic syndrome and diabetes. She stopped taking metformin secondary to GI side effects. She is interested in Victoza. She continues to work on diet and exercise to decrease risk of diabetes.  Vitamin D Deficiency Madison [name] has a diagnosis of vitamin D deficiency. She is currently taking prescription Vit D. She notes fatigue and denies nausea, vomiting or muscle weakness.  ASSESSMENT AND PLAN:  Insulin resistance - Plan: liraglutide (VICTOZA) 18 MG/3ML SOPN, Insulin Pen Needle (BD PEN NEEDLE NANO 2ND GEN) 32G X 4 MM MISC  Vitamin D deficiency  Class 2 severe obesity with serious comorbidity and body mass index (BMI) of 37.0 to 37.9 in adult, unspecified obesity type (HCC)  PLAN:   Insulin Resistance Madison [name] will continue to work on weight loss, exercise, and decreasing simple carbohydrates in her diet to help decrease the risk of diabetes. We dicussed metformin including benefits and risks. She was informed that eating too many simple carbohydrates or too many calories at one sitting increases the likelihood of GI side effects. Madison [name] agrees to start Victoza 0.6 mg SubQ daily #1 pen with no refills. Madison [name] agrees to follow up with our clinic in 2 weeks as directed to monitor her progress.  Vitamin D Deficiency Madison [name] was informed that low vitamin D levels contributes to fatigue and are associated with obesity, breast, and colon cancer. Madison [name] agrees to continue taking prescription Vit D @50 ,000 IU every week, no refill needed. She will follow up for routine testing of vitamin D, at least 2-3 times per year. She was informed of the risk of over-replacement of vitamin D and agrees to not increase her dose unless she discusses this with us first. Madison [name] agrees to follow up with our clinic in 2 weeks.  Obesity Madison [name] is currently in the action stage of change. As such, her goal is to continue with weight loss efforts She has agreed to follow a lower carbohydrate, vegetable and lean protein rich diet plan Madison [name] has been instructed to work up to a goal of 150 minutes of combined cardio and strengthening exercise per week for weight loss and overall health benefits. We discussed the following Behavioral Modification Strategies today: increasing lean protein intake, work on meal planning and easy cooking plans, emotional eating strategies  and ways to avoid boredom eating, better snacking choices, and planning for success   Jezabell has agreed to follow up with our clinic in 2 weeks. She was informed of the importance of frequent follow up visits to maximize her success with intensive lifestyle modifications for her multiple health conditions.  ALLERGIES: Allergies  Allergen Reactions  .  Atorvastatin Other (See Comments)    Joint pain  . Crestor [Rosuvastatin Calcium] Swelling  . Latex Rash  . Neosporin [Neomycin-Bacitracin Zn-Polymyx] Rash  . Toradol [Ketorolac Tromethamine] Rash    MEDICATIONS: Current Outpatient Medications on File Prior to Visit  Medication Sig Dispense Refill  . diltiazem (CARDIZEM CD) 240 MG 24 hr capsule Take 1 capsule (240 mg total) by mouth daily. 30 capsule 2  . diltiazem (CARDIZEM) 30 MG tablet Take 1 tablet (30 mg total) by mouth as needed (SVT EPISODES). 30 tablet 3  . diltiazem (TIAZAC) 360 MG 24 hr capsule TAKE 1 CAPSULE (360 MG TOTAL) BY MOUTH DAILY. 90 capsule 3  . hydrOXYzine (ATARAX/VISTARIL) 50 MG tablet TAKE 1 TO 2 TABLETS BY MOUTH EVERY 6 HOURS AS NEEDED FOR ITCHIN  1  . levothyroxine (SYNTHROID, LEVOTHROID) 100 MCG tablet Take 100 mcg by mouth daily before breakfast.    . loratadine (CLARITIN) 10 MG tablet Take 10 mg by mouth daily.    . metFORMIN (GLUCOPHAGE) 500 MG tablet Take 1 tablet (500 mg total) by mouth 2 (two) times daily with a meal. 60 tablet 0  . mometasone (NASONEX) 50 MCG/ACT nasal spray Place 2 sprays into the nose daily.    . Pitavastatin Calcium (LIVALO PO) Take 2 mg by mouth daily.     . TRI FEMYNOR 0.18/0.215/0.25 MG-35 MCG tablet Take 1 tablet by mouth daily.  3  . Vitamin D, Ergocalciferol, (DRISDOL) 1.25 MG (50000 UT) CAPS capsule Take 1 capsule (50,000 Units total) by mouth every 7 (seven) days. 12 capsule 0   No current facility-administered medications on file prior to visit.     PAST MEDICAL HISTORY: Past Medical History:  Diagnosis Date  . Allergy   . Anxiety   . Asthma   . Back pain   . Constipation   . Eczema   . Exercise-induced bronchospasm 06/12/2016  . GERD (gastroesophageal reflux disease)   . Herpes simplex disease   . Hyperlipidemia   . Hypothyroidism   . Joint pain   . Palpitation   . SOB (shortness of breath)   . SVT (supraventricular tachycardia) (HCC) 11/30/2016  . Swelling     bilat LE    PAST SURGICAL HISTORY: Past Surgical History:  Procedure Laterality Date  . Gum Grafting  11/2015, 06/2017  . KNEE ARTHROSCOPY Right 2001  . WISDOM TOOTH EXTRACTION Bilateral 1997    SOCIAL HISTORY: Social History   Tobacco Use  . Smoking status: Never Smoker  . Smokeless tobacco: Never Used  Substance Use Topics  . Alcohol use: Yes  . Drug use: No    FAMILY HISTORY: Family History  Problem Relation Age of Onset  . Eczema Father   . Sleep apnea Father   . High blood pressure Mother   . High Cholesterol Mother   . Thyroid disease Mother   . Allergic rhinitis Neg Hx   . Angioedema Neg Hx   . Asthma Neg Hx   . Atopy Neg Hx   . Immunodeficiency Neg Hx   . Urticaria Neg Hx     ROS: Review of Systems  Constitutional: Positive for malaise/fatigue. Negative for  weight loss.  Gastrointestinal: Negative for nausea and vomiting.  Musculoskeletal:       Negative muscle weakness    PHYSICAL EXAM: Pt in no acute distress  RECENT LABS AND TESTS: BMET    Component Value Date/Time   NA 141 08/24/2018 1217   K 4.1 08/24/2018 1217   CL 101 08/24/2018 1217   CO2 22 08/24/2018 1217   GLUCOSE 83 08/24/2018 1217   BUN 11 08/24/2018 1217   CREATININE 0.75 08/24/2018 1217   CALCIUM 9.2 08/24/2018 1217   GFRNONAA 102 08/24/2018 1217   GFRAA 118 08/24/2018 1217   Lab Results  Component Value Date   HGBA1C 5.2 08/24/2018   Lab Results  Component Value Date   INSULIN 9.6 12/14/2018   INSULIN 6.2 08/24/2018   CBC    Component Value Date/Time   WBC 7.3 08/24/2018 1217   RBC 4.39 08/24/2018 1217   HGB 13.8 08/24/2018 1217   HCT 40.3 08/24/2018 1217   MCV 92 08/24/2018 1217   MCH 31.4 08/24/2018 1217   MCHC 34.2 08/24/2018 1217   RDW 12.3 08/24/2018 1217   LYMPHSABS 2.0 08/24/2018 1217   EOSABS 0.2 08/24/2018 1217   BASOSABS 0.1 08/24/2018 1217   Iron/TIBC/Ferritin/ %Sat No results found for: IRON, TIBC, FERRITIN, IRONPCTSAT Lipid Panel      Component Value Date/Time   CHOL 198 12/14/2018 1000   TRIG 142 12/14/2018 1000   HDL 65 12/14/2018 1000   LDLCALC 105 (H) 12/14/2018 1000   Hepatic Function Panel     Component Value Date/Time   PROT 7.1 08/24/2018 1217   ALBUMIN 4.6 08/24/2018 1217   AST 18 08/24/2018 1217   ALT 11 08/24/2018 1217   ALKPHOS 64 08/24/2018 1217   BILITOT 0.4 08/24/2018 1217      Component Value Date/Time   TSH 2.490 12/14/2018 1000   TSH 2.030 08/24/2018 1217   TSH 4.214 11/24/2016 0400      I, Burt Knack, am acting as transcriptionist for Debbra Riding, MD  I have reviewed the above documentation for accuracy and completeness, and I agree with the above. - Debbra Riding, MD

## 2019-03-09 ENCOUNTER — Encounter (INDEPENDENT_AMBULATORY_CARE_PROVIDER_SITE_OTHER): Payer: Self-pay | Admitting: Family Medicine

## 2019-03-09 ENCOUNTER — Ambulatory Visit (INDEPENDENT_AMBULATORY_CARE_PROVIDER_SITE_OTHER): Payer: No Typology Code available for payment source | Admitting: Family Medicine

## 2019-03-09 ENCOUNTER — Telehealth: Payer: No Typology Code available for payment source | Admitting: Physician Assistant

## 2019-03-09 ENCOUNTER — Other Ambulatory Visit: Payer: Self-pay

## 2019-03-09 DIAGNOSIS — R21 Rash and other nonspecific skin eruption: Secondary | ICD-10-CM

## 2019-03-09 DIAGNOSIS — E559 Vitamin D deficiency, unspecified: Secondary | ICD-10-CM | POA: Diagnosis not present

## 2019-03-09 DIAGNOSIS — E8881 Metabolic syndrome: Secondary | ICD-10-CM | POA: Diagnosis not present

## 2019-03-09 DIAGNOSIS — Z6837 Body mass index (BMI) 37.0-37.9, adult: Secondary | ICD-10-CM

## 2019-03-09 MED ORDER — PREDNISONE 10 MG (21) PO TBPK: ORAL_TABLET | ORAL | 0 refills | Status: DC

## 2019-03-09 MED FILL — predniSONE 10 MG (21) TBPK: 10 | 6 days supply | Qty: 21 | Fill #0

## 2019-03-09 NOTE — Progress Notes (Signed)
E Visit for Rash  We are sorry that you are not feeling well. Here is how we plan to help!  Based on what you shared with me it looks like you have contact dermatitis. Although less likely, it could also be a reaction to the new nasal spray. Either way, you should discontinue the nasal spray completely and take the medication prescribed today.  Contact dermatitis is a skin rash caused by something that touches the skin and causes irritation or inflammation.  Your skin may be red, swollen, dry, cracked, and itch.  The rash should go away in a few days but can last a few weeks.  If you get a rash, it's important to figure out what caused it so the irritant can be avoided in the future. and I have prescribed Prednisone 10 mg daily for 6 days (see taper instructions below).   \ Prednisone is a steroid and can cause side effects such as headache, irritability, nausea, vomiting, increased heart rate, increased blood pressure, increased blood sugar, appetite changes, and insomnia. Please take tablets in the morning with a full meal to help decrease the chances of these side effects.   I would also suggest contacting a dermatologist to schedule a follow up appointment for the future as it appears you have had a few skin reactions within the past year.    HOME CARE:   Take cool showers and avoid direct sunlight.  Apply cool compress or wet dressings.  Take a bath in an oatmeal bath.  Sprinkle content of one Aveeno packet under running faucet with comfortably warm water.  Bathe for 15-20 minutes, 1-2 times daily.  Pat dry with a towel. Do not rub the rash.  Use hydrocortisone cream.  Take an antihistamine like Benadryl for widespread rashes that itch.  The adult dose of Benadryl is 25-50 mg by mouth 4 times daily.  Caution:  This type of medication may cause sleepiness.  Do not drink alcohol, drive, or operate dangerous machinery while taking antihistamines.  Do not take these medications if you have  prostate enlargement.  Read package instructions thoroughly on all medications that you take.  GET HELP RIGHT AWAY IF:   Symptoms don't go away after treatment.  Severe itching that persists.  If you rash spreads or swells.  If you rash begins to smell.  If it blisters and opens or develops a yellow-brown crust.  You develop a fever.  You have a sore throat.  You become short of breath.  MAKE SURE YOU:  Understand these instructions. Will watch your condition. Will get help right away if you are not doing well or get worse.  Thank you for choosing an e-visit. Your e-visit answers were reviewed by a board certified advanced clinical practitioner to complete your personal care plan. Depending upon the condition, your plan could have included both over the counter or prescription medications. Please review your pharmacy choice. Be sure that the pharmacy you have chosen is open so that you can pick up your prescription now.  If there is a problem you may message your provider in MyChart to have the prescription routed to another pharmacy. Your safety is important to [place]. If you have drug allergies check your prescription carefully.  For the next 24 hours, you can use MyChart to ask questions about today's visit, request a non-urgent call back, or ask for a work or school excuse from your e-visit provider. You will get an email in the next two days asking  about your experience. I hope that your e-visit has been valuable and will speed your recovery.   I have spent 5 minutes in review of e-visit questionnaire, review and updating patient chart, medical decision making and response to patient.    Madison CoreBrittany Ellysa Parrack, PA-C

## 2019-03-12 NOTE — Progress Notes (Signed)
Office: [phone]  /  Fax: [phone] TeleHealth Visit:  Madison [name] has verbally consented to this TeleHealth visit today. The patient is located at home, the provider is located at the [facility] office. The participants in this visit include the listed provider and patient. The visit was conducted today via face time.  HPI:   Chief Complaint: OBESITY Madison [name] is here to discuss her progress with her obesity treatment plan. She is on the lower carbohydrate, vegetable and lean protein rich diet plan and is following her eating plan approximately 100 % of the time. She states she is doing yoga, orange theory, and 5k training plan for 30-60 minutes 5-6 times per week. Madison [name] feels she has lost weight, but her scale stayed the same. She is getting tired of working from home. She did experience a decrease in hunger on the Low Carbohydrate plan.  We were unable to weigh the patient today for this TeleHealth visit. She feels as if she has maintained her weight since her last visit. She has lost 7 lbs since starting treatment with us.  Vitamin D Deficiency Madison [name] has a diagnosis of vitamin D deficiency. She is currently taking prescription Vit D. She notes fatigue and denies nausea, vomiting or muscle weakness.  Insulin Resistance Madison [name] has a diagnosis of insulin resistance based on her elevated fasting insulin level >5. Although Niesha's blood glucose readings are still under good control, insulin resistance puts her at greater risk of metabolic syndrome and diabetes. She denies carbohydrate cravings (on low carbohydrate plan). She denies GI side effects of Victoza and continues to work on diet and exercise to decrease risk of diabetes.  ASSESSMENT AND PLAN:  Vitamin D deficiency  Insulin resistance  Class 2 severe obesity with serious comorbidity and body mass index (BMI) of 37.0 to 37.9 in adult, unspecified obesity type (HCC)  PLAN:  Vitamin D Deficiency Madison [name] was  informed that low vitamin D levels contributes to fatigue and are associated with obesity, breast, and colon cancer. Madison [name] agrees to continue taking prescription Vit D @50 ,000 IU every week, no refill needed. She will follow up for routine testing of vitamin D, at least 2-3 times per year. She was informed of the risk of over-replacement of vitamin D and agrees to not increase her dose unless she discusses this with us first. Madison [name] agrees to follow up with our clinic in 2 weeks.  Insulin Resistance Madison [name] will continue to work on weight loss, exercise, and decreasing simple carbohydrates in her diet to help decrease the risk of diabetes. We dicussed metformin including benefits and risks. She was informed that eating too many simple carbohydrates or too many calories at one sitting increases the likelihood of GI side effects. Madison [name] agrees to continue Victoza, and she agrees to follow up with our clinic in 2 weeks as directed to monitor her progress.  Obesity Madison [name] is currently in the action stage of change. As such, her goal is to continue with weight loss efforts She has agreed to follow a lower carbohydrate, vegetable and lean protein rich diet plan Madison [name] has been instructed to work up to a goal of 150 minutes of combined cardio and strengthening exercise per week or continue doing yoga, orange theory, and 5k training for weight loss and overall health benefits. We discussed the following Behavioral Modification Strategies today: increasing lean protein intake, decrease eating out and work on meal planning and easy cooking plans, and planning for success   Madison [name] has agreed  to follow up with our clinic in 2 weeks. She was informed of the importance of frequent follow up visits to maximize her success with intensive lifestyle modifications for her multiple health conditions.  ALLERGIES: Allergies  Allergen Reactions  . Atorvastatin Other (See Comments)    Joint pain  . Crestor [Rosuvastatin  Calcium] Swelling  . Latex Rash  . Neosporin [Neomycin-Bacitracin Zn-Polymyx] Rash  . Toradol [Ketorolac Tromethamine] Rash    MEDICATIONS: Current Outpatient Medications on File Prior to Visit  Medication Sig Dispense Refill  . diltiazem (CARDIZEM CD) 240 MG 24 hr capsule Take 1 capsule (240 mg total) by mouth daily. 30 capsule 2  . diltiazem (CARDIZEM) 30 MG tablet Take 1 tablet (30 mg total) by mouth as needed (SVT EPISODES). 30 tablet 3  . diltiazem (TIAZAC) 360 MG 24 hr capsule TAKE 1 CAPSULE (360 MG TOTAL) BY MOUTH DAILY. 90 capsule 3  . hydrOXYzine (ATARAX/VISTARIL) 50 MG tablet TAKE 1 TO 2 TABLETS BY MOUTH EVERY 6 HOURS AS NEEDED FOR ITCHIN  1  . Insulin Pen Needle (BD PEN NEEDLE NANO 2ND GEN) 32G X 4 MM MISC 1 Package by Does not apply route 2 (two) times daily. 100 each 0  . levothyroxine (SYNTHROID, LEVOTHROID) 100 MCG tablet Take 100 mcg by mouth daily before breakfast.    . liraglutide (VICTOZA) 18 MG/3ML SOPN Inject 0.1 mLs (0.6 mg total) into the skin every morning. 1 pen 0  . loratadine (CLARITIN) 10 MG tablet Take 10 mg by mouth daily.    . metFORMIN (GLUCOPHAGE) 500 MG tablet Take 1 tablet (500 mg total) by mouth 2 (two) times daily with a meal. 60 tablet 0  . mometasone (NASONEX) 50 MCG/ACT nasal spray Place 2 sprays into the nose daily.    . Pitavastatin Calcium (LIVALO PO) Take 2 mg by mouth daily.     . TRI FEMYNOR 0.18/0.215/0.25 MG-35 MCG tablet Take 1 tablet by mouth daily.  3  . Vitamin D, Ergocalciferol, (DRISDOL) 1.25 MG (50000 UT) CAPS capsule Take 1 capsule (50,000 Units total) by mouth every 7 (seven) days. 12 capsule 0   No current facility-administered medications on file prior to visit.     PAST MEDICAL HISTORY: Past Medical History:  Diagnosis Date  . Allergy   . Anxiety   . Asthma   . Back pain   . Constipation   . Eczema   . Exercise-induced bronchospasm 06/12/2016  . GERD (gastroesophageal reflux disease)   . Herpes simplex disease   .  Hyperlipidemia   . Hypothyroidism   . Joint pain   . Palpitation   . SOB (shortness of breath)   . SVT (supraventricular tachycardia) (HCC) 11/30/2016  . Swelling    bilat LE    PAST SURGICAL HISTORY: Past Surgical History:  Procedure Laterality Date  . Gum Grafting  11/2015, 06/2017  . KNEE ARTHROSCOPY Right 2001  . WISDOM TOOTH EXTRACTION Bilateral 1997    SOCIAL HISTORY: Social History   Tobacco Use  . Smoking status: Never Smoker  . Smokeless tobacco: Never Used  Substance Use Topics  . Alcohol use: Yes  . Drug use: No    FAMILY HISTORY: Family History  Problem Relation Age of Onset  . Eczema Father   . Sleep apnea Father   . High blood pressure Mother   . High Cholesterol Mother   . Thyroid disease Mother   . Allergic rhinitis Neg Hx   . Angioedema Neg Hx   . Asthma Neg Hx   .  Atopy Neg Hx   . Immunodeficiency Neg Hx   . Urticaria Neg Hx     ROS: Review of Systems  Constitutional: Positive for malaise/fatigue. Negative for weight loss.  Gastrointestinal: Negative for nausea and vomiting.  Musculoskeletal:       Negative muscle weakness    PHYSICAL EXAM: Pt in no acute distress  RECENT LABS AND TESTS: BMET    Component Value Date/Time   NA 141 08/24/2018 1217   K 4.1 08/24/2018 1217   CL 101 08/24/2018 1217   CO2 22 08/24/2018 1217   GLUCOSE 83 08/24/2018 1217   BUN 11 08/24/2018 1217   CREATININE 0.75 08/24/2018 1217   CALCIUM 9.2 08/24/2018 1217   GFRNONAA 102 08/24/2018 1217   GFRAA 118 08/24/2018 1217   Lab Results  Component Value Date   HGBA1C 5.2 08/24/2018   Lab Results  Component Value Date   INSULIN 9.6 12/14/2018   INSULIN 6.2 08/24/2018   CBC    Component Value Date/Time   WBC 7.3 08/24/2018 1217   RBC 4.39 08/24/2018 1217   HGB 13.8 08/24/2018 1217   HCT 40.3 08/24/2018 1217   MCV 92 08/24/2018 1217   MCH 31.4 08/24/2018 1217   MCHC 34.2 08/24/2018 1217   RDW 12.3 08/24/2018 1217   LYMPHSABS 2.0 08/24/2018 1217    EOSABS 0.2 08/24/2018 1217   BASOSABS 0.1 08/24/2018 1217   Iron/TIBC/Ferritin/ %Sat No results found for: IRON, TIBC, FERRITIN, IRONPCTSAT Lipid Panel     Component Value Date/Time   CHOL 198 12/14/2018 1000   TRIG 142 12/14/2018 1000   HDL 65 12/14/2018 1000   LDLCALC 105 (H) 12/14/2018 1000   Hepatic Function Panel     Component Value Date/Time   PROT 7.1 08/24/2018 1217   ALBUMIN 4.6 08/24/2018 1217   AST 18 08/24/2018 1217   ALT 11 08/24/2018 1217   ALKPHOS 64 08/24/2018 1217   BILITOT 0.4 08/24/2018 1217      Component Value Date/Time   TSH 2.490 12/14/2018 1000   TSH 2.030 08/24/2018 1217   TSH 4.214 11/24/2016 0400      I, Burt Knack, am acting as transcriptionist for Debbra Riding, MD  I have reviewed the above documentation for accuracy and completeness, and I agree with the above. - Debbra Riding, MD

## 2019-03-23 ENCOUNTER — Encounter: Payer: Self-pay | Admitting: Allergy & Immunology

## 2019-03-23 ENCOUNTER — Other Ambulatory Visit: Payer: Self-pay

## 2019-03-23 ENCOUNTER — Ambulatory Visit (INDEPENDENT_AMBULATORY_CARE_PROVIDER_SITE_OTHER): Payer: No Typology Code available for payment source | Admitting: Family Medicine

## 2019-03-23 ENCOUNTER — Encounter (INDEPENDENT_AMBULATORY_CARE_PROVIDER_SITE_OTHER): Payer: Self-pay | Admitting: Family Medicine

## 2019-03-23 ENCOUNTER — Telehealth (INDEPENDENT_AMBULATORY_CARE_PROVIDER_SITE_OTHER): Payer: No Typology Code available for payment source | Admitting: Allergy & Immunology

## 2019-03-23 DIAGNOSIS — Z6837 Body mass index (BMI) 37.0-37.9, adult: Secondary | ICD-10-CM | POA: Diagnosis not present

## 2019-03-23 DIAGNOSIS — J45990 Exercise induced bronchospasm: Secondary | ICD-10-CM

## 2019-03-23 DIAGNOSIS — E8881 Metabolic syndrome: Secondary | ICD-10-CM

## 2019-03-23 DIAGNOSIS — L501 Idiopathic urticaria: Secondary | ICD-10-CM | POA: Diagnosis not present

## 2019-03-23 DIAGNOSIS — E559 Vitamin D deficiency, unspecified: Secondary | ICD-10-CM | POA: Diagnosis not present

## 2019-03-23 MED ORDER — PREDNISONE 10 MG PO TABS: ORAL_TABLET | ORAL | 0 refills | Status: DC

## 2019-03-23 MED ORDER — MONTELUKAST SODIUM 10 MG PO TABS: 10.0000 mg | ORAL_TABLET | Freq: Every day | ORAL | 5 refills | Status: DC

## 2019-03-23 MED ORDER — LIRAGLUTIDE 18 MG/3ML ~~LOC~~ SOPN: 0.6000 mg | PEN_INJECTOR | SUBCUTANEOUS | 0 refills | Status: DC

## 2019-03-23 MED FILL — TRIAMCINOLONE 0.5% CREAM: 0.5 | 30 days supply | Qty: 15 | Fill #0

## 2019-03-23 MED FILL — VICTOZA 18 MG/3 ML INJECT P: 18 | 30 days supply | Qty: 3 | Fill #0

## 2019-03-23 MED FILL — hydrOXYzine HCL 50 MG TABS: 50 | 12 days supply | Qty: 90 | Fill #0

## 2019-03-23 MED FILL — LIVALO 2 MG TABLET: 2 | 90 days supply | Qty: 90 | Fill #0

## 2019-03-23 NOTE — Progress Notes (Signed)
RE: KAMRIE KLECHA MRN: [id number] DOB: [birth year] Date of Telemedicine Visit: 03/23/2019  Referring provider: Milus Height, PA-C Primary care provider: Milus Height, PA-C  Chief Complaint: Allergic Reaction and Urticaria   Telemedicine Follow Up Visit via Telephone: I connected with Davetta Heydon for a follow up on 03/23/19 by WebEx and verified that I am speaking with the correct person using two identifiers.   I discussed the limitations, risks, security and privacy concerns of performing an evaluation and management service by telemedicine and the availability of in person appointments. I also discussed with the patient that there may be a patient responsible charge related to this service. The patient expressed understanding and agreed to proceed.  Patient is at home.  Provider is at the office.  Visit start time: 9:51 AM Visit end time: 10:34 AM Insurance consent/check in by: [employer] consent and medical assistant/nurse: Ashleigh  History of Present Illness:  She is a 38 y.o. female, who is being followed for allergic rhinitis, urticaria, and exercise-induced bronchospasm. Her previous allergy office visit was in October 2018 with Dr. Delorse Lek. At that time, we continued with allergen avoidance measures to grasses, weeds, molds, dust mites, and cat. We also continued with Claritin 10mg  BID. It was recommended that she continued with the use of a nasal steroid. For her hives, she was continued with Claritin BID to help control them.   Since the last visit, she has mostly done well. She does report that she is continuing to break out hives over the last couple of weeks. She went to Urgent Care six weeks ago and was started on a prednisone taper over six days. It immediately got worse when she was off of the prednisone. She is unsure whether there is a food that has made it worse. She has been using Claritin BID and Benadryl. Allegra dries her out more with increased  nosebleeds. She has been on cetirizine but this causes some sleepiness, so she prefers to not take it.   In the interim, until two weeks ago, she was "not that bad". Prior to this, she was having breakouts very rarely. She thought that it was related to stress, but she continues to have problems despite the decrease in the stress. She is having the problems even in the winter. She did have testing performed in September 2019 that demonstrated negative results to shellfish and strawberry as well as grape. She is on a low carb weight loss plan. She ate a lot of red meat recently due to this change in her diet. She denies tick bites.   She denies fevers or joint pains. When the rash started up she did have some puffiness of her fingers but there was no pain at all. There was no pain in the wrist or finger joints. There is no history of autoimmunity in her family, although her mother's history is largely unknown since she does not know who her father is (maternal grandfather unknown). She did start Victoza on April 20th. The hives did start shortly thereafter.   Her allergic rhinitis symptoms and her exercise-induced bronchospasm have been well controlled.  She does request a refill of her montelukast today, but she does not need an albuterol inhaler.  Otherwise, there have been no changes to her past medical history, surgical history, family history, or social history.        Assessment and Plan:  Sade is a 38 y.o. female with:  Chronic idiopathic urticaria - ? contact dermatitis or  cholinergic urticaria or drug reaction to Victoza  Allergic rhinitis   Exercise-induced bronchospasm   Ms. Minteer presents for an evaluation of a rash.  She does say that it looks like the rash that she was evaluated for 2 years ago, but to me today it almost looks like a contact dermatitis.  In particular, the vesicle formation does not appear to be consistent with chronic urticaria.  However, without the  vesicles could certainly past for cholinergic urticaria.  She did start Victoza prior to the onset of the rash, but an adverse reaction to the drug is lower on my differential given the similarity between the rationale and the rash 2 years ago.  Regardless, that is something to keep in differential.  She seems convinced that this is related to foods, and she would like extensive food testing performed.  I told her that the easiest and quickest way to get this result is with skin testing to her food panel.  She is going to do this in 1 week with Dr. Delorse Lek.  We are going to put her on a prolonged prednisone taper in hopes that she does not develop rebound symptoms.   Allergic rhinitis -Continue with the antihistamines for your hives, as they should be helping with the rhinitis symptoms as well.  -We continue with the nose spray as needed during the worst times of the year.  Chronic Urticaria (hives) - Start two Claritin in the morning. - Start 1-2 Zyrtec at night.  - Start the prednisone taper sent in.  - We will do food allergy testing at the next visit.  - We are going to get an alpha gal panel, inflammatory markers, and ANA today.  Exercise-induced bronchospasm - Continue with montelukast 10 mg daily. - Continue with albuterol 2 puffs every 4-6 hours as needed.    - Start Singulair  as well as 15 minutes prior to physical activity.  Follow-up in 1 week for food allergy testing.  Diagnostics: None.  Medication List:  Current Outpatient Medications  Medication Sig Dispense Refill  . diltiazem (CARDIZEM CD) 240 MG 24 hr capsule Take 1 capsule (240 mg total) by mouth daily. 30 capsule 2  . diltiazem (CARDIZEM) 30 MG tablet Take 1 tablet (30 mg total) by mouth as needed (SVT EPISODES). 30 tablet 3  . hydrOXYzine (ATARAX/VISTARIL) 50 MG tablet TAKE 1 TO 2 TABLETS BY MOUTH EVERY 6 HOURS AS NEEDED FOR ITCHIN  1  . Insulin Pen Needle (BD PEN NEEDLE NANO 2ND GEN) 32G X 4 MM MISC 1 Package  by Does not apply route 2 (two) times daily. 100 each 0  . levothyroxine (SYNTHROID, LEVOTHROID) 100 MCG tablet Take 100 mcg by mouth daily before breakfast.    . liraglutide (VICTOZA) 18 MG/3ML SOPN Inject 0.1 mLs (0.6 mg total) into the skin every morning. 1 pen 0  . loratadine (CLARITIN) 10 MG tablet Take 10 mg by mouth daily.    . mometasone (NASONEX) 50 MCG/ACT nasal spray Place 2 sprays into the nose daily.    . Pitavastatin Calcium (LIVALO PO) Take 2 mg by mouth daily.     . TRI FEMYNOR 0.18/0.215/0.25 MG-35 MCG tablet Take 1 tablet by mouth daily.  3  . Vitamin D, Ergocalciferol, (DRISDOL) 1.25 MG (50000 UT) CAPS capsule Take 1 capsule (50,000 Units total) by mouth every 7 (seven) days. 12 capsule 0  . montelukast (SINGULAIR) 10 MG tablet Take 1 tablet (10 mg total) by mouth at bedtime. 30 tablet 5  .  predniSONE (DELTASONE) 10 MG tablet Take 3 tabs (30mg ) twice daily for 3 days, then 2 tabs (20mg ) twice daily for 3 days, then 1 tab (10mg ) twice daily for 3 days, then STOP. 36 tablet 0   No current facility-administered medications for this visit.    Allergies: Allergies  Allergen Reactions  . Atorvastatin Other (See Comments)    Joint pain  . Crestor [Rosuvastatin Calcium] Swelling  . Latex Rash  . Neosporin [Neomycin-Bacitracin Zn-Polymyx] Rash  . Toradol [Ketorolac Tromethamine] Rash   I reviewed her past medical history, social history, family history, and environmental history and no significant changes have been reported from previous visits.  Review of Systems  Objective:  Physical exam not obtained as encounter was done via telephone.   Previous notes and tests were reviewed.  I discussed the assessment and treatment plan with the patient. The patient was provided an opportunity to ask questions and all were answered. The patient agreed with the plan and demonstrated an understanding of the instructions.   The patient was advised to call back or seek an in-person  evaluation if the symptoms worsen or if the condition fails to improve as anticipated.  I provided 43 minutes of non-face-to-face time during this encounter.  It was my pleasure to participate in ScribnerEmily Drennon's care today. Please feel free to contact me with any questions or concerns.   Sincerely,  Alfonse SpruceJoel Louis Alaycia Eardley, MD

## 2019-03-23 NOTE — Patient Instructions (Addendum)
Allergic rhinitis -Continue with the antihistamines for your hives, as they should be helping with the rhinitis symptoms as well.  -We continue with the nose spray as needed during the worst times of the year.  Chronic Urticaria (hives) - Start two Claritin in the morning. - Start 1-2 Zyrtec at night.  - Start the prednisone taper sent in.  - We will do food allergy testing at the next visit.  - We are going to get an alpha gal panel, inflammatory markers, and ANA today.  Exercise-induced bronchospasm - Continue with montelukast 10 mg daily. - Continue with albuterol 2 puffs every 4-6 hours as needed.     Follow-up in 1 week for food allergy testing.

## 2019-03-23 NOTE — Telephone Encounter (Signed)
Please advise 

## 2019-03-23 NOTE — Progress Notes (Signed)
Office: [phone]  /  Fax: [phone] TeleHealth Visit:  Madison [name] has verbally consented to this TeleHealth visit today. The patient is located at home, the provider is located at the [facility] office. The participants in this visit include the listed provider and patient. The visit was conducted today via face time.  HPI:   Chief Complaint: OBESITY Madison [name] is here to discuss her progress with her obesity treatment plan. She is on the lower carbohydrate, vegetable and lean protein rich diet plan and is following her eating plan approximately 90 % of the time. She states she is doing orange theory, 5k training, and yoga for 30-60 minutes 5-6 times per week. Dondrea's weight is of 228 lbs. She is experiencing an allergic reaction, currently unknown cause. She has been eating more chicken and fish, and laying off the beef and pork. She did have some stress eating secondary to rash.  We were unable to weigh the patient today for this TeleHealth visit. She feels as if she has lost 1 lb since her last visit. She has lost 7-8 lbs since starting treatment with us.  Vitamin D Deficiency Madison [name] has a diagnosis of vitamin D deficiency. She is currently taking prescription Vit D. She notes fatigue and denies nausea, vomiting or muscle weakness.  Insulin Resistance Madison [name] has a diagnosis of insulin resistance based on her elevated fasting insulin level >5. Although Angelicia's blood glucose readings are still under good control, insulin resistance puts her at greater risk of metabolic syndrome and diabetes. She notes minimal carbohydrate cravings, and denies GI side effects of Victoza. She continues to work on diet and exercise to decrease risk of diabetes.  ASSESSMENT AND PLAN:  Vitamin D deficiency  Insulin resistance - Plan: liraglutide (VICTOZA) 18 MG/3ML SOPN  Class 2 severe obesity with serious comorbidity and body mass index (BMI) of 37.0 to 37.9 in adult, unspecified obesity  type (HCC)  PLAN:  Vitamin D Deficiency Madison [name] was informed that low vitamin D levels contributes to fatigue and are associated with obesity, breast, and colon cancer. Madison [name] agrees to continue taking prescription Vit D @50 ,000 IU every week and will follow up for routine testing of vitamin D, at least 2-3 times per year. She was informed of the risk of over-replacement of vitamin D and agrees to not increase her dose unless she discusses this with us first. Madison [name] agrees to follow up with our clinic in 2 weeks.  Insulin Resistance Madison [name] will continue to work on weight loss, exercise, and decreasing simple carbohydrates in her diet to help decrease the risk of diabetes. We dicussed metformin including benefits and risks. She was informed that eating too many simple carbohydrates or too many calories at one sitting increases the likelihood of GI side effects. Madison [name] agrees to continue Victoza 0.6 mg SubQ daily #1 pen and we will refill for 1 month. Madison [name] agrees to follow up with our clinic in 2 weeks as directed to monitor her progress.  Obesity Madison [name] is currently in the action stage of change. As such, her goal is to continue with weight loss efforts She has agreed to follow a lower carbohydrate, vegetable and lean protein rich diet plan Madison [name] has been instructed to work up to a goal of 150 minutes of combined cardio and strengthening exercise per week for weight loss and overall health benefits. We discussed the following Behavioral Modification Strategies today: increasing lean protein intake, increasing vegetables and work on meal planning and easy cooking plans,  keeping healthy foods in the home, and planning for success   Madison [name] has agreed to follow up with our clinic in 2 weeks. She was informed of the importance of frequent follow up visits to maximize her success with intensive lifestyle modifications for her multiple health conditions.  ALLERGIES: Allergies  Allergen Reactions  .  Atorvastatin Other (See Comments)    Joint pain  . Crestor [Rosuvastatin Calcium] Swelling  . Latex Rash  . Neosporin [Neomycin-Bacitracin Zn-Polymyx] Rash  . Toradol [Ketorolac Tromethamine] Rash    MEDICATIONS: Current Outpatient Medications on File Prior to Visit  Medication Sig Dispense Refill  . diltiazem (CARDIZEM CD) 240 MG 24 hr capsule Take 1 capsule (240 mg total) by mouth daily. 30 capsule 2  . diltiazem (CARDIZEM) 30 MG tablet Take 1 tablet (30 mg total) by mouth as needed (SVT EPISODES). 30 tablet 3  . hydrOXYzine (ATARAX/VISTARIL) 50 MG tablet TAKE 1 TO 2 TABLETS BY MOUTH EVERY 6 HOURS AS NEEDED FOR ITCHIN  1  . Insulin Pen Needle (BD PEN NEEDLE NANO 2ND GEN) 32G X 4 MM MISC 1 Package by Does not apply route 2 (two) times daily. 100 each 0  . levothyroxine (SYNTHROID, LEVOTHROID) 100 MCG tablet Take 100 mcg by mouth daily before breakfast.    . loratadine (CLARITIN) 10 MG tablet Take 10 mg by mouth daily.    . mometasone (NASONEX) 50 MCG/ACT nasal spray Place 2 sprays into the nose daily.    . Pitavastatin Calcium (LIVALO PO) Take 2 mg by mouth daily.     . TRI FEMYNOR 0.18/0.215/0.25 MG-35 MCG tablet Take 1 tablet by mouth daily.  3  . Vitamin D, Ergocalciferol, (DRISDOL) 1.25 MG (50000 UT) CAPS capsule Take 1 capsule (50,000 Units total) by mouth every 7 (seven) days. 12 capsule 0   No current facility-administered medications on file prior to visit.     PAST MEDICAL HISTORY: Past Medical History:  Diagnosis Date  . Allergy   . Anxiety   . Asthma   . Back pain   . Constipation   . Eczema   . Exercise-induced bronchospasm 06/12/2016  . GERD (gastroesophageal reflux disease)   . Herpes simplex disease   . Hyperlipidemia   . Hypothyroidism   . Joint pain   . Palpitation   . SOB (shortness of breath)   . SVT (supraventricular tachycardia) (HCC) 11/30/2016  . Swelling    bilat LE    PAST SURGICAL HISTORY: Past Surgical History:  Procedure Laterality Date  .  Gum Grafting  11/2015, 06/2017  . KNEE ARTHROSCOPY Right 2001  . WISDOM TOOTH EXTRACTION Bilateral 1997    SOCIAL HISTORY: Social History   Tobacco Use  . Smoking status: Never Smoker  . Smokeless tobacco: Never Used  Substance Use Topics  . Alcohol use: Yes  . Drug use: No    FAMILY HISTORY: Family History  Problem Relation Age of Onset  . Eczema Father   . Sleep apnea Father   . High blood pressure Mother   . High Cholesterol Mother   . Thyroid disease Mother   . Allergic rhinitis Neg Hx   . Angioedema Neg Hx   . Asthma Neg Hx   . Atopy Neg Hx   . Immunodeficiency Neg Hx   . Urticaria Neg Hx     ROS: Review of Systems  Constitutional: Positive for malaise/fatigue and weight loss.  Gastrointestinal: Negative for nausea and vomiting.  Musculoskeletal:       Negative muscle weakness  PHYSICAL EXAM: Pt in no acute distress  RECENT LABS AND TESTS: BMET    Component Value Date/Time   NA 141 08/24/2018 1217   K 4.1 08/24/2018 1217   CL 101 08/24/2018 1217   CO2 22 08/24/2018 1217   GLUCOSE 83 08/24/2018 1217   BUN 11 08/24/2018 1217   CREATININE 0.75 08/24/2018 1217   CALCIUM 9.2 08/24/2018 1217   GFRNONAA 102 08/24/2018 1217   GFRAA 118 08/24/2018 1217   Lab Results  Component Value Date   HGBA1C 5.2 08/24/2018   Lab Results  Component Value Date   INSULIN 9.6 12/14/2018   INSULIN 6.2 08/24/2018   CBC    Component Value Date/Time   WBC 7.3 08/24/2018 1217   RBC 4.39 08/24/2018 1217   HGB 13.8 08/24/2018 1217   HCT 40.3 08/24/2018 1217   MCV 92 08/24/2018 1217   MCH 31.4 08/24/2018 1217   MCHC 34.2 08/24/2018 1217   RDW 12.3 08/24/2018 1217   LYMPHSABS 2.0 08/24/2018 1217   EOSABS 0.2 08/24/2018 1217   BASOSABS 0.1 08/24/2018 1217   Iron/TIBC/Ferritin/ %Sat No results found for: IRON, TIBC, FERRITIN, IRONPCTSAT Lipid Panel     Component Value Date/Time   CHOL 198 12/14/2018 1000   TRIG 142 12/14/2018 1000   HDL 65 12/14/2018 1000    LDLCALC 105 (H) 12/14/2018 1000   Hepatic Function Panel     Component Value Date/Time   PROT 7.1 08/24/2018 1217   ALBUMIN 4.6 08/24/2018 1217   AST 18 08/24/2018 1217   ALT 11 08/24/2018 1217   ALKPHOS 64 08/24/2018 1217   BILITOT 0.4 08/24/2018 1217      Component Value Date/Time   TSH 2.490 12/14/2018 1000   TSH 2.030 08/24/2018 1217   TSH 4.214 11/24/2016 0400      I, Burt Knack, am acting as transcriptionist for Debbra Riding, MD  I have reviewed the above documentation for accuracy and completeness, and I agree with the above. - Debbra Riding, MD

## 2019-03-24 ENCOUNTER — Ambulatory Visit: Payer: No Typology Code available for payment source | Admitting: Allergy

## 2019-03-24 ENCOUNTER — Telehealth: Payer: No Typology Code available for payment source | Admitting: Allergy

## 2019-03-24 MED FILL — MONTELUKAST SOD 10 MG TAB: 10 | 30 days supply | Qty: 30 | Fill #0

## 2019-03-30 ENCOUNTER — Encounter: Payer: Self-pay | Admitting: Allergy

## 2019-03-30 ENCOUNTER — Other Ambulatory Visit: Payer: Self-pay

## 2019-03-30 ENCOUNTER — Ambulatory Visit: Payer: No Typology Code available for payment source | Admitting: Allergy

## 2019-03-30 VITALS — BP 112/78 | HR 95 | Temp 96.4°F | Resp 18 | Ht 64.5 in | Wt 237.0 lb

## 2019-03-30 DIAGNOSIS — L501 Idiopathic urticaria: Secondary | ICD-10-CM

## 2019-03-30 DIAGNOSIS — J45990 Exercise induced bronchospasm: Secondary | ICD-10-CM

## 2019-03-30 DIAGNOSIS — J301 Allergic rhinitis due to pollen: Secondary | ICD-10-CM | POA: Diagnosis not present

## 2019-03-30 MED ORDER — MOMETASONE FUROATE 50 MCG/ACT NA SUSP: 2.0000 | Freq: Every day | NASAL | 5 refills | Status: DC

## 2019-03-30 MED ORDER — EPINEPHRINE 0.3 MG/0.3ML IJ SOAJ: 0.3000 mg | Freq: Once | INTRAMUSCULAR | 2 refills | Status: AC

## 2019-03-30 MED FILL — MOMETASONE FUROATE 50 MCG S: 50 | 30 days supply | Qty: 17 | Fill #0

## 2019-03-30 NOTE — Progress Notes (Signed)
Follow-up Note  RE: Madison Cornersmily R Farnworth MRN: [id number] DOB: Feb 26, 1981 Date of Office Visit: 03/30/2019   History of present illness: Madison [name] is a 38 y.o. female presenting today for skin testing visit.  She was seen as a e-visit with Dr. Dellis AnesGallagher on 03/23/19 for rash of unclear etiology.  She was prescribed a course of prednisone and she has 2 days left.  Rash is improved with use of prednisone.  She was concerned that the rash may be triggered by food and thus she was scheduled for skin testing visit to assess for food allergy.  She has held her antihistamines for testing for today.   She denies fever but states the prednisone has been making her sweat more.    Review of systems: Review of Systems  Constitutional: Positive for diaphoresis. Negative for chills and fever.  HENT: Negative for congestion, ear discharge and sore throat.   Eyes: Negative for discharge and redness.  Respiratory: Negative for cough, shortness of breath and wheezing.   Gastrointestinal: Negative.   Musculoskeletal: Negative for myalgias.  Skin: Positive for rash.  Neurological: Negative for headaches.    All other systems negative unless noted above in HPI  Past medical/social/surgical/family history have been reviewed and are unchanged unless specifically indicated below.  No changes  Medication List: Allergies as of 03/30/2019      Reactions   Atorvastatin Other (See Comments)   Joint pain   Crestor [rosuvastatin Calcium] Swelling   Latex Rash   Neosporin [neomycin-bacitracin Zn-polymyx] Rash   Toradol [ketorolac Tromethamine] Rash      Medication List       Accurate as of Mar 30, 2019 10:24 AM. If you have any questions, ask your nurse or doctor.        diltiazem 240 MG 24 hr capsule Commonly known as:  CARDIZEM CD Take 1 capsule (240 mg total) by mouth daily.   diltiazem 30 MG tablet Commonly known as:  Cardizem Take 1 tablet (30 mg total) by mouth as needed (SVT EPISODES).    hydrOXYzine 50 MG tablet Commonly known as:  ATARAX/VISTARIL TAKE 1 TO 2 TABLETS BY MOUTH EVERY 6 HOURS AS NEEDED FOR ITCHIN   Insulin Pen Needle 32G X 4 MM Misc Commonly known as:  BD Pen Needle Nano 2nd Gen 1 Package by Does not apply route 2 (two) times daily.   levothyroxine 100 MCG tablet Commonly known as:  SYNTHROID Take 100 mcg by mouth daily before breakfast.   liraglutide 18 MG/3ML Sopn Commonly known as:  Victoza Inject 0.1 mLs (0.6 mg total) into the skin every morning.   LIVALO PO Take 2 mg by mouth daily.   loratadine 10 MG tablet Commonly known as:  CLARITIN Take 10 mg by mouth daily.   mometasone 50 MCG/ACT nasal spray Commonly known as:  NASONEX Place 2 sprays into the nose daily.   montelukast 10 MG tablet Commonly known as:  SINGULAIR Take 1 tablet (10 mg total) by mouth at bedtime.   predniSONE 10 MG tablet Commonly known as:  DELTASONE Take 3 tabs (30mg ) twice daily for 3 days, then 2 tabs (20mg ) twice daily for 3 days, then 1 tab (10mg ) twice daily for 3 days, then STOP.   Tri Femynor 0.18/0.215/0.25 MG-35 MCG tablet Generic drug:  Norgestimate-Ethinyl Estradiol Triphasic Take 1 tablet by mouth daily.   Vitamin D (Ergocalciferol) 1.25 MG (50000 UT) Caps capsule Commonly known as:  DRISDOL Take 1 capsule (50,000 Units total) by mouth  every 7 (seven) days.       Known medication allergies: Allergies  Allergen Reactions  . Atorvastatin Other (See Comments)    Joint pain  . Crestor [Rosuvastatin Calcium] Swelling  . Latex Rash  . Neosporin [Neomycin-Bacitracin Zn-Polymyx] Rash  . Toradol [Ketorolac Tromethamine] Rash      Physical examination: Blood pressure 112/78, pulse 95, temperature (!) 96.4 F (35.8 C), temperature source Temporal, resp. rate 18, height 5' 4.5" (1.638 m), weight 237 lb (107.5 kg), SpO2 97 %.  General: Alert, interactive, in no acute distress. HEENT: PERRLA, TMs pearly gray, turbinates minimally edematous without  discharge, post-pharynx non erythematous. Neck: Supple without lymphadenopathy. Lungs: Clear to auscultation without wheezing, rhonchi or rales. {no increased work of breathing. CV: Normal S1, S2 without murmurs. Abdomen: Nondistended, nontender. Skin: mild erythematous slightly papular rash over b/l hands and wrist. Extremities:  No clubbing, cyanosis or edema. Neuro:   Grossly intact.  Diagnositics/Labs:  Allergy testing: food allergy skin prick panel is positive to shellfish mix and grape.   Allergy testing results were read and interpreted by provider, documented by clinical staff.   Assessment and plan:   Allergic rhinitis - resume your antihistamines for hives, as they should be helping with the rhinitis symptoms as well.  -We continue with the nose spray as needed during the worst times of the year.  Chronic Urticaria (hives) - Continue two Claritin in the morning. - Continue 1-2 Zyrtec at night.  - Complete the prednisone taper.  - Food allergy testing today is positive to shellfish and grape.  Recommend removing these items from the diet and monitor for symptoms/rash.   Will provide with emergency action plan as well as AuviQ for as needed use in case of severe allergic reaction.   - Will obtain the following labs today: alpha gal panel, inflammatory markers, urticaria panel and ANA   Exercise-induced bronchospasm - Continue with montelukast 10 mg daily. - Continue with albuterol 2 puffs every 4-6 hours as needed.     Follow-up in 3-6 months or sooner if needed  I appreciate the opportunity to take part in Daylee's care. Please do not hesitate to contact me with questions.  Sincerely,   Margo Aye, MD Allergy/Immunology [facility]

## 2019-03-30 NOTE — Patient Instructions (Addendum)
Allergic rhinitis - resume your antihistamines for hives, as they should be helping with the rhinitis symptoms as well.  -We continue with the nose spray as needed during the worst times of the year.  Chronic Urticaria (hives) - Continue two Claritin in the morning. - Continue 1-2 Zyrtec at night.  - Complete the prednisone taper.  - Food allergy testing today is positive to shellfish and grape.  Recommend removing these items from the diet and monitor for symptoms/rash.   Will provide with emergency action plan as well as AuviQ for as needed use in case of severe allergic reaction.   - Will obtain the following labs today: alpha gal panel, inflammatory markers, urticaria panel and ANA   Exercise-induced bronchospasm - Continue with montelukast 10 mg daily. - Continue with albuterol 2 puffs every 4-6 hours as needed.     Follow-up in 3-6 months or sooner if needed

## 2019-04-05 ENCOUNTER — Encounter (INDEPENDENT_AMBULATORY_CARE_PROVIDER_SITE_OTHER): Payer: Self-pay

## 2019-04-05 LAB — ANA W/REFLEX IF POSITIVE: Anti Nuclear Antibody (ANA): NEGATIVE

## 2019-04-05 LAB — C-REACTIVE PROTEIN: CRP: <1 mg/L (ref 0–10)

## 2019-04-05 LAB — SEDIMENTATION RATE: Sed Rate: 18 mm/hr (ref 0–32)

## 2019-04-05 LAB — ALPHA-GAL PANEL
Alpha Gal IgE*: <0.1 kU/L (ref <0.10)
Beef (Bos spp) IgE: <0.1 kU/L (ref <0.35)
Class Interpretation: 0
Class Interpretation: 0
Class Interpretation: 0
Lamb/Mutton (Ovis spp) IgE: <0.1 kU/L (ref <0.35)
Pork (Sus spp) IgE: <0.1 kU/L (ref <0.35)

## 2019-04-05 LAB — THYROID ANTIBODIES
Thyroglobulin Antibody: 4.7 IU/mL — ABNORMAL HIGH (ref 0.0–0.9)
Thyroperoxidase Ab SerPl-aCnc: 108 IU/mL — ABNORMAL HIGH (ref 0–34)

## 2019-04-05 LAB — CHRONIC URTICARIA: cu index: 3.4 (ref <10)

## 2019-04-06 ENCOUNTER — Ambulatory Visit (INDEPENDENT_AMBULATORY_CARE_PROVIDER_SITE_OTHER): Payer: No Typology Code available for payment source | Admitting: Family Medicine

## 2019-04-06 ENCOUNTER — Encounter (INDEPENDENT_AMBULATORY_CARE_PROVIDER_SITE_OTHER): Payer: Self-pay | Admitting: Family Medicine

## 2019-04-06 ENCOUNTER — Other Ambulatory Visit: Payer: Self-pay

## 2019-04-06 DIAGNOSIS — E7849 Other hyperlipidemia: Secondary | ICD-10-CM

## 2019-04-06 DIAGNOSIS — E559 Vitamin D deficiency, unspecified: Secondary | ICD-10-CM

## 2019-04-06 DIAGNOSIS — E8881 Metabolic syndrome: Secondary | ICD-10-CM | POA: Diagnosis not present

## 2019-04-06 DIAGNOSIS — L509 Urticaria, unspecified: Secondary | ICD-10-CM

## 2019-04-06 DIAGNOSIS — Z6837 Body mass index (BMI) 37.0-37.9, adult: Secondary | ICD-10-CM

## 2019-04-06 NOTE — Progress Notes (Signed)
Office: [phone]  /  Fax: [phone] TeleHealth Visit:  Madison [name] has verbally consented to this TeleHealth visit today. The patient is located at home, the provider is located at the [facility] office. The participants in this visit include the listed provider and patient and any and all parties involved. The visit was conducted today via FaceTime.  HPI:   Chief Complaint: OBESITY Madison [name] is here to discuss her progress with her obesity treatment plan. She is on the Category 2 plan and is following her eating plan approximately 90 % of the time. She states she is running and doing yoga for 30 to 60 minutes 5 times per week. Madison [name] has a weight of 228 pounds today. She has been following the plan 90% of the time. Madison [name] was on prednisone for hives, for 9 days, to control hives. Madison [name] is not hungry. We were unable to weigh the patient today for this TeleHealth visit. She feels as if she has maintained weight since her last visit. She has lost 3 lbs since starting treatment with [place].  Insulin Resistance Madison [name] has a diagnosis of insulin resistance based on her elevated fasting insulin level >5. Although Madison [name]'s blood glucose readings are still under good control, insulin resistance puts her at greater risk of metabolic syndrome and diabetes. She denies any side effects on Victoza. Madison [name] continues to work on diet and exercise to decrease risk of diabetes. Madison [name] has minimal carb cravings.  Urticaria Madison [name] has a diagnosis of urticaria and she is seeing an allergist. Her rash and hives are better.  Vitamin D deficiency Madison [name] has a diagnosis of vitamin D deficiency. Madison [name] is currently taking vit D. She admits fatigue and denies nausea, vomiting or muscle weakness.  Hyperlipidemia Madison [name] has hyperlipidemia and her last LDL was at 105 and last HDL was at 65. She has been trying to improve her cholesterol levels with intensive lifestyle modification including a low saturated fat  diet, exercise and weight loss. She denies any chest pain or myalgias.  ASSESSMENT AND PLAN:  Insulin resistance - Plan: Hemoglobin A1c, Insulin, random  Urticaria  Vitamin D deficiency - Plan: VITAMIN D 25 Hydroxy (Vit-D Deficiency, Fractures)  Other hyperlipidemia - Plan: Lipid Panel With LDL/HDL Ratio  Class 2 severe obesity with serious comorbidity and body mass index (BMI) of 37.0 to 37.9 in adult, unspecified obesity type (HCC)  PLAN:  Insulin Resistance Madison [name] will continue to work on weight loss, exercise, and decreasing simple carbohydrates in her diet to help decrease the risk of diabetes. We dicussed metformin including benefits and risks. She was informed that eating too many simple carbohydrates or too many calories at one sitting increases the likelihood of GI side effects. We will check Hgb A1c and insulin level. Madison [name] will continue Victoza and follow up with [place] as directed to monitor her progress.  Urticaria Madison [name] will follow up with her allergist. She will follow up with our clinic in 2 weeks.  Vitamin D Deficiency Madison [name] was informed that low vitamin D levels contributes to fatigue and are associated with obesity, breast, and colon cancer. She will continue to take prescription Vit D ,000 IU every week and will follow up for routine testing of vitamin D, at least 2-3 times per year. She was informed of the risk of over-replacement of vitamin D and agrees to not increase her dose unless she discusses this with [place] first. We will check vitamin D level today.  Hyperlipidemia Madison [name] was informed of the  American Heart Association Guidelines emphasizing intensive lifestyle modifications as the first line treatment for hyperlipidemia. We discussed many lifestyle modifications today in depth, and Madison [name] will continue to work on decreasing saturated fats such as fatty red meat, butter and many fried foods. She will also increase vegetables and lean protein in her diet and continue to  work on exercise and weight loss efforts. We will check fasting lipid panel and Madison [name] will follow up as directed.  Obesity Madison [name] is currently in the action stage of change. As such, her goal is to continue with weight loss efforts She has agreed to follow the Category 2 plan Madison [name] has been instructed to work up to a goal of 150 minutes of combined cardio and strengthening exercise per week for weight loss and overall health benefits. We discussed the following Behavioral Modification Strategies today: planning for success, keeping healthy foods in the home, increasing lean protein intake, increasing vegetables and work on meal planning and easy cooking plans  Madison [name] has agreed to follow up with our clinic in 2 weeks. She was informed of the importance of frequent follow up visits to maximize her success with intensive lifestyle modifications for her multiple health conditions.  ALLERGIES: Allergies  Allergen Reactions  . Atorvastatin Other (See Comments)    Joint pain  . Crestor [Rosuvastatin Calcium] Swelling  . Latex Rash  . Neosporin [Neomycin-Bacitracin Zn-Polymyx] Rash  . Toradol [Ketorolac Tromethamine] Rash    MEDICATIONS: Current Outpatient Medications on File Prior to Visit  Medication Sig Dispense Refill  . diltiazem (CARDIZEM CD) 240 MG 24 hr capsule Take 1 capsule (240 mg total) by mouth daily. 30 capsule 2  . diltiazem (CARDIZEM) 30 MG tablet Take 1 tablet (30 mg total) by mouth as needed (SVT EPISODES). 30 tablet 3  . hydrOXYzine (ATARAX/VISTARIL) 50 MG tablet TAKE 1 TO 2 TABLETS BY MOUTH EVERY 6 HOURS AS NEEDED FOR ITCHIN  1  . Insulin Pen Needle (BD PEN NEEDLE NANO 2ND GEN) 32G X 4 MM MISC 1 Package by Does not apply route 2 (two) times daily. 100 each 0  . levothyroxine (SYNTHROID, LEVOTHROID) 100 MCG tablet Take 100 mcg by mouth daily before breakfast.    . liraglutide (VICTOZA) 18 MG/3ML SOPN Inject 0.1 mLs (0.6 mg total) into the skin every morning. 1 pen 0  .  loratadine (CLARITIN) 10 MG tablet Take 10 mg by mouth daily.    . mometasone (NASONEX) 50 MCG/ACT nasal spray Place 2 sprays into the nose daily. 17 g 5  . montelukast (SINGULAIR) 10 MG tablet Take 1 tablet (10 mg total) by mouth at bedtime. 30 tablet 5  . Pitavastatin Calcium (LIVALO PO) Take 2 mg by mouth daily.     . predniSONE (DELTASONE) 10 MG tablet Take 3 tabs (30mg ) twice daily for 3 days, then 2 tabs (20mg ) twice daily for 3 days, then 1 tab (10mg ) twice daily for 3 days, then STOP. 36 tablet 0  . TRI FEMYNOR 0.18/0.215/0.25 MG-35 MCG tablet Take 1 tablet by mouth daily.  3  . Vitamin D, Ergocalciferol, (DRISDOL) 1.25 MG (50000 UT) CAPS capsule Take 1 capsule (50,000 Units total) by mouth every 7 (seven) days. 12 capsule 0   No current facility-administered medications on file prior to visit.     PAST MEDICAL HISTORY: Past Medical History:  Diagnosis Date  . Allergy   . Anxiety   . Asthma   . Back pain   . Constipation   . Eczema   .  Exercise-induced bronchospasm 06/12/2016  . GERD (gastroesophageal reflux disease)   . Herpes simplex disease   . Hyperlipidemia   . Hypothyroidism   . Joint pain   . Palpitation   . SOB (shortness of breath)   . SVT (supraventricular tachycardia) (HCC) 11/30/2016  . Swelling    bilat LE  . Urticaria     PAST SURGICAL HISTORY: Past Surgical History:  Procedure Laterality Date  . Gum Grafting  11/2015, 06/2017  . KNEE ARTHROSCOPY Right 2001  . WISDOM TOOTH EXTRACTION Bilateral 1997    SOCIAL HISTORY: Social History   Tobacco Use  . Smoking status: Never Smoker  . Smokeless tobacco: Never Used  Substance Use Topics  . Alcohol use: Yes  . Drug use: No    FAMILY HISTORY: Family History  Problem Relation Age of Onset  . Eczema Father   . Sleep apnea Father   . High blood pressure Mother   . High Cholesterol Mother   . Thyroid disease Mother   . Allergic rhinitis Neg Hx   . Angioedema Neg Hx   . Asthma Neg Hx   . Atopy Neg  Hx   . Immunodeficiency Neg Hx   . Urticaria Neg Hx     ROS: Review of Systems  Constitutional: Positive for malaise/fatigue. Negative for weight loss.  Cardiovascular: Negative for chest pain.  Gastrointestinal: Negative for diarrhea, nausea and vomiting.  Musculoskeletal: Negative for myalgias.       Negative for muscle weakness  Skin: Positive for rash.       Positive for hives  Endo/Heme/Allergies:       Positive for carb cravings    PHYSICAL EXAM: Pt in no acute distress  RECENT LABS AND TESTS: BMET    Component Value Date/Time   NA 141 08/24/2018 1217   K 4.1 08/24/2018 1217   CL 101 08/24/2018 1217   CO2 22 08/24/2018 1217   GLUCOSE 83 08/24/2018 1217   BUN 11 08/24/2018 1217   CREATININE 0.75 08/24/2018 1217   CALCIUM 9.2 08/24/2018 1217   GFRNONAA 102 08/24/2018 1217   GFRAA 118 08/24/2018 1217   Lab Results  Component Value Date   HGBA1C 5.2 08/24/2018   Lab Results  Component Value Date   INSULIN 9.6 12/14/2018   INSULIN 6.2 08/24/2018   CBC    Component Value Date/Time   WBC 7.3 08/24/2018 1217   RBC 4.39 08/24/2018 1217   HGB 13.8 08/24/2018 1217   HCT 40.3 08/24/2018 1217   MCV 92 08/24/2018 1217   MCH 31.4 08/24/2018 1217   MCHC 34.2 08/24/2018 1217   RDW 12.3 08/24/2018 1217   LYMPHSABS 2.0 08/24/2018 1217   EOSABS 0.2 08/24/2018 1217   BASOSABS 0.1 08/24/2018 1217   Iron/TIBC/Ferritin/ %Sat No results found for: IRON, TIBC, FERRITIN, IRONPCTSAT Lipid Panel     Component Value Date/Time   CHOL 198 12/14/2018 1000   TRIG 142 12/14/2018 1000   HDL 65 12/14/2018 1000   LDLCALC 105 (H) 12/14/2018 1000   Hepatic Function Panel     Component Value Date/Time   PROT 7.1 08/24/2018 1217   ALBUMIN 4.6 08/24/2018 1217   AST 18 08/24/2018 1217   ALT 11 08/24/2018 1217   ALKPHOS 64 08/24/2018 1217   BILITOT 0.4 08/24/2018 1217      Component Value Date/Time   TSH 2.490 12/14/2018 1000   TSH 2.030 08/24/2018 1217   TSH 4.214  11/24/2016 0400    Results for DORETHY, TOMEY (MRN [id number])  as of 04/06/2019 12:37  Ref. Range 12/14/2018 10:00  Vitamin D, 25-Hydroxy Latest Ref Range: 30.0 - 100.0 ng/mL 27.9 (L)    I, Nevada Crane, am acting as transcriptionist for Filbert Schilder, MD   I have reviewed the above documentation for accuracy and completeness, and I agree with the above. - Debbra Riding, MD

## 2019-04-12 ENCOUNTER — Telehealth: Payer: Self-pay | Admitting: *Deleted

## 2019-04-12 ENCOUNTER — Encounter (INDEPENDENT_AMBULATORY_CARE_PROVIDER_SITE_OTHER): Payer: Self-pay | Admitting: Family Medicine

## 2019-04-12 NOTE — Telephone Encounter (Signed)
Can we add a Thyroid panel?

## 2019-04-12 NOTE — Telephone Encounter (Signed)
    COVID-19 Pre-Screening Questions:  . In the past 7 to 10 days have you had a cough,  shortness of breath, headache, congestion, fever (100 or greater) body aches, chills, sore throat, or sudden loss of taste or sense of smell? . Have you been around anyone with known Covid 19. . Have you been around anyone who is awaiting Covid 19 test results in the past 7 to 10 days? . Have you been around anyone who has been exposed to Covid 19, or has mentioned symptoms of Covid 19 within the past 7 to 10 days?  If you have any concerns/questions about symptoms patients report during screening (either on the phone or at threshold). Contact the provider seeing the patient or DOD for further guidance.  If neither are available contact a member of the leadership team.          Madison [name] via phone call. All covid 19 questions were no. Has a mask.  KB

## 2019-04-12 NOTE — Telephone Encounter (Signed)
Please advise 

## 2019-04-12 NOTE — Telephone Encounter (Signed)
Yes I can.

## 2019-04-13 ENCOUNTER — Other Ambulatory Visit: Payer: No Typology Code available for payment source | Admitting: *Deleted

## 2019-04-13 ENCOUNTER — Other Ambulatory Visit: Payer: Self-pay

## 2019-04-13 ENCOUNTER — Other Ambulatory Visit: Payer: Self-pay | Admitting: *Deleted

## 2019-04-13 DIAGNOSIS — R7989 Other specified abnormal findings of blood chemistry: Secondary | ICD-10-CM

## 2019-04-13 DIAGNOSIS — E8881 Metabolic syndrome: Secondary | ICD-10-CM

## 2019-04-13 LAB — LIPID PANEL
Chol/HDL Ratio: 3 ratio (ref 0.0–4.4)
Cholesterol, Total: 218 mg/dL — ABNORMAL HIGH (ref 100–199)
HDL: 73 mg/dL (ref >39)
LDL Calculated: 121 mg/dL — ABNORMAL HIGH (ref 0–99)
Triglycerides: 122 mg/dL (ref 0–149)
VLDL Cholesterol Cal: 24 mg/dL (ref 5–40)

## 2019-04-13 LAB — TSH: TSH: 1.74 u[IU]/mL (ref 0.450–4.500)

## 2019-04-13 LAB — T3, FREE: T3, Free: 2.9 pg/mL (ref 2.0–4.4)

## 2019-04-13 LAB — T4, FREE: Free T4: 1.3 ng/dL (ref 0.82–1.77)

## 2019-04-14 LAB — HEMOGLOBIN A1C
Est. average glucose Bld gHb Est-mCnc: 108 mg/dL
Hgb A1c MFr Bld: 5.4 % (ref 4.8–5.6)

## 2019-04-14 LAB — VITAMIN D 25 HYDROXY (VIT D DEFICIENCY, FRACTURES): Vit D, 25-Hydroxy: 41.5 ng/mL (ref 30.0–100.0)

## 2019-04-14 LAB — INSULIN, RANDOM: INSULIN: 9.9 u[IU]/mL (ref 2.6–24.9)

## 2019-04-24 ENCOUNTER — Ambulatory Visit (INDEPENDENT_AMBULATORY_CARE_PROVIDER_SITE_OTHER): Payer: No Typology Code available for payment source | Admitting: Family Medicine

## 2019-04-24 ENCOUNTER — Encounter (INDEPENDENT_AMBULATORY_CARE_PROVIDER_SITE_OTHER): Payer: Self-pay | Admitting: Family Medicine

## 2019-04-24 ENCOUNTER — Other Ambulatory Visit: Payer: Self-pay

## 2019-04-24 DIAGNOSIS — Z6837 Body mass index (BMI) 37.0-37.9, adult: Secondary | ICD-10-CM

## 2019-04-24 DIAGNOSIS — E66812 Obesity, class 2: Secondary | ICD-10-CM

## 2019-04-24 DIAGNOSIS — E7849 Other hyperlipidemia: Secondary | ICD-10-CM | POA: Diagnosis not present

## 2019-04-24 DIAGNOSIS — E559 Vitamin D deficiency, unspecified: Secondary | ICD-10-CM | POA: Diagnosis not present

## 2019-04-24 NOTE — Progress Notes (Signed)
Office: [phone]  /  Fax: [phone] TeleHealth Visit:  Madison [name] has verbally consented to this TeleHealth visit today. The patient is located at home, the provider is located at the [facility] office. The participants in this visit include the listed provider and patient. The visit was conducted today via Facetime.  HPI:   Chief Complaint: OBESITY Madison [name] is here to discuss her progress with her obesity treatment plan. She is on the  follow the Category 2 plan and is following her eating plan approximately 90 % of the time. She states she is exercising by jogging and doing yoga for 20-30 minutes 3-7 times per week. Madison [name] denies hunger between meals. She has no difficulty following the plan. She is ready to go back into the office and stop working from home. She is still enjoying the meal plan and is getting more creative. She [place] planning [place] go [place] in [place].   We were unable to weigh the patient today for this TeleHealth visit. She feels as if she has maintained weight since her last visit. She has lost 3 lbs since starting treatment with us.  Hyperlipidemia Madison [name] has hyperlipidemia and has been trying to improve her cholesterol levels with intensive lifestyle modification including a low saturated fat diet, exercise and weight loss.Her LDL increased to 121 and HDL 73.  She denies any chest pain, claudication or myalgias. She is currently taking pravastatin.   Vitamin D deficiency Madison [name] has a diagnosis of vitamin D deficiency. She is currently taking vit D and denies nausea, vomiting or muscle weakness. She reports fatigue.   ASSESSMENT AND PLAN:  Other hyperlipidemia  Vitamin D deficiency  Class 2 severe obesity with serious comorbidity and body mass index (BMI) of 37.0 to 37.9 in adult, unspecified obesity type (HCC)  PLAN: Hyperlipidemia Madison [name] was informed of the American Heart Association Guidelines emphasizing intensive lifestyle  modifications as the first line treatment for hyperlipidemia. We discussed many lifestyle modifications today in depth, and Madison [name] will continue to work on decreasing saturated fats such as fatty red meat, butter and many fried foods. She will also increase vegetables and lean protein in her diet and continue to work on exercise and weight loss efforts.  Vitamin D Deficiency Madison [name] was informed that low vitamin D levels contributes to fatigue and are associated with obesity, breast, and colon cancer. She agrees to continue to take prescription Vit D @50 ,000 IU every week and will follow up for routine testing of vitamin D, at least 2-3 times per year. She was informed of the risk of over-replacement of vitamin D and agrees to not increase her dose unless she discusses this with us first.  Obesity Madison [name] is currently in the action stage of change. As such, her goal is to continue with weight loss efforts She has agreed to follow the Category 2 plan Madison [name] has been instructed to work up to a goal of 150 minutes of combined cardio and strengthening exercise per week for weight loss and overall health benefits. We discussed the following Behavioral Modification Stratagies today: keeping healthy foods in the home, better snacking choices, planning for success,  increasing lean protein intake, increasing vegetables and work on meal planning and easy cooking plans  Madison [name] has agreed to follow up with our clinic in 2 weeks. She was informed of the importance of frequent follow up visits to maximize her success with intensive lifestyle modifications for her multiple health conditions.  ALLERGIES: Allergies  Allergen Reactions  . Atorvastatin Other (See Comments)    Joint pain  . Crestor [Rosuvastatin Calcium] Swelling  . Latex Rash  . Neosporin [Neomycin-Bacitracin Zn-Polymyx] Rash  . Toradol [Ketorolac Tromethamine] Rash    MEDICATIONS: Current Outpatient Medications on File Prior to Visit  Medication  Sig Dispense Refill  . diltiazem (CARDIZEM CD) 240 MG 24 hr capsule Take 1 capsule (240 mg total) by mouth daily. 30 capsule 2  . diltiazem (CARDIZEM) 30 MG tablet Take 1 tablet (30 mg total) by mouth as needed (SVT EPISODES). 30 tablet 3  . hydrOXYzine (ATARAX/VISTARIL) 50 MG tablet TAKE 1 TO 2 TABLETS BY MOUTH EVERY 6 HOURS AS NEEDED FOR ITCHIN  1  . Insulin Pen Needle (BD PEN NEEDLE NANO 2ND GEN) 32G X 4 MM MISC 1 Package by Does not apply route 2 (two) times daily. 100 each 0  . levothyroxine (SYNTHROID, LEVOTHROID) 100 MCG tablet Take 100 mcg by mouth daily before breakfast.    . liraglutide (VICTOZA) 18 MG/3ML SOPN Inject 0.1 mLs (0.6 mg total) into the skin every morning. 1 pen 0  . loratadine (CLARITIN) 10 MG tablet Take 10 mg by mouth daily.    . mometasone (NASONEX) 50 MCG/ACT nasal spray Place 2 sprays into the nose daily. 17 g 5  . montelukast (SINGULAIR) 10 MG tablet Take 1 tablet (10 mg total) by mouth at bedtime. 30 tablet 5  . Pitavastatin Calcium (LIVALO PO) Take 2 mg by mouth daily.     . TRI FEMYNOR 0.18/0.215/0.25 MG-35 MCG tablet Take 1 tablet by mouth daily.  3  . Vitamin D, Ergocalciferol, (DRISDOL) 1.25 MG (50000 UT) CAPS capsule Take 1 capsule (50,000 Units total) by mouth every 7 (seven) days. 12 capsule 0   No current facility-administered medications on file prior to visit.     PAST MEDICAL HISTORY: Past Medical History:  Diagnosis Date  . Allergy   . Anxiety   . Asthma   . Back pain   . Constipation   . Eczema   . Exercise-induced bronchospasm 06/12/2016  . GERD (gastroesophageal reflux disease)   . Herpes simplex disease   . Hyperlipidemia   . Hypothyroidism   . Joint pain   . Palpitation   . SOB (shortness of breath)   . SVT (supraventricular tachycardia) (HCC) 11/30/2016  . Swelling    bilat LE  . Urticaria     PAST SURGICAL HISTORY: Past Surgical History:  Procedure Laterality Date  . Gum Grafting  11/2015, 06/2017  . KNEE ARTHROSCOPY Right 2001   . WISDOM TOOTH EXTRACTION Bilateral 1997    SOCIAL HISTORY: Social History   Tobacco Use  . Smoking status: Never Smoker  . Smokeless tobacco: Never Used  Substance Use Topics  . Alcohol use: Yes  . Drug use: No    FAMILY HISTORY: Family History  Problem Relation Age of Onset  . Eczema Father   . Sleep apnea Father   . High blood pressure Mother   . High Cholesterol Mother   . Thyroid disease Mother   . Allergic rhinitis Neg Hx   . Angioedema Neg Hx   . Asthma Neg Hx   . Atopy Neg Hx   . Immunodeficiency Neg Hx   . Urticaria Neg Hx     ROS: Review of Systems  Constitutional: Positive for fever.  Cardiovascular: Negative for chest pain and claudication.  Gastrointestinal: Negative for nausea and vomiting.  Musculoskeletal: Negative for myalgias.       Negative  for muscle weakness    PHYSICAL EXAM: Pt in no acute distress  RECENT LABS AND TESTS: BMET    Component Value Date/Time   NA 141 08/24/2018 1217   K 4.1 08/24/2018 1217   CL 101 08/24/2018 1217   CO2 22 08/24/2018 1217   GLUCOSE 83 08/24/2018 1217   BUN 11 08/24/2018 1217   CREATININE 0.75 08/24/2018 1217   CALCIUM 9.2 08/24/2018 1217   GFRNONAA 102 08/24/2018 1217   GFRAA 118 08/24/2018 1217   Lab Results  Component Value Date   HGBA1C 5.4 04/13/2019   HGBA1C 5.2 08/24/2018   Lab Results  Component Value Date   INSULIN 9.9 04/13/2019   INSULIN 9.6 12/14/2018   INSULIN 6.2 08/24/2018   CBC    Component Value Date/Time   WBC 7.3 08/24/2018 1217   RBC 4.39 08/24/2018 1217   HGB 13.8 08/24/2018 1217   HCT 40.3 08/24/2018 1217   MCV 92 08/24/2018 1217   MCH 31.4 08/24/2018 1217   MCHC 34.2 08/24/2018 1217   RDW 12.3 08/24/2018 1217   LYMPHSABS 2.0 08/24/2018 1217   EOSABS 0.2 08/24/2018 1217   BASOSABS 0.1 08/24/2018 1217   Iron/TIBC/Ferritin/ %Sat No results found for: IRON, TIBC, FERRITIN, IRONPCTSAT Lipid Panel     Component Value Date/Time   CHOL 218 (H) 04/13/2019 0000    TRIG 122 04/13/2019 0000   HDL 73 04/13/2019 0000   CHOLHDL 3.0 04/13/2019 0000   LDLCALC 121 (H) 04/13/2019 0000   Hepatic Function Panel     Component Value Date/Time   PROT 7.1 08/24/2018 1217   ALBUMIN 4.6 08/24/2018 1217   AST 18 08/24/2018 1217   ALT 11 08/24/2018 1217   ALKPHOS 64 08/24/2018 1217   BILITOT 0.4 08/24/2018 1217      Component Value Date/Time   TSH 1.740 04/13/2019 0000   TSH 2.490 12/14/2018 1000   TSH 2.030 08/24/2018 1217   Results for KAYAL, MULA (MRN [id number]) as of 04/24/2019 20:03  Ref. Range 04/13/2019 00:00  Vitamin D, 25-Hydroxy Latest Ref Range: 30.0 - 100.0 ng/mL 41.5     I, Renee Ramus, am acting as Location manager for Ilene Qua, MD   I have reviewed the above documentation for accuracy and completeness, and I agree with the above. - Ilene Qua, MD

## 2019-05-01 ENCOUNTER — Encounter (INDEPENDENT_AMBULATORY_CARE_PROVIDER_SITE_OTHER): Payer: Self-pay | Admitting: Family Medicine

## 2019-05-03 MED FILL — OMEPRAZOLE 20 MG CPDR: 20 | 90 days supply | Qty: 90 | Fill #0

## 2019-05-11 ENCOUNTER — Other Ambulatory Visit: Payer: Self-pay

## 2019-05-15 ENCOUNTER — Encounter: Payer: Self-pay | Admitting: Internal Medicine

## 2019-05-15 ENCOUNTER — Other Ambulatory Visit: Payer: Self-pay

## 2019-05-15 ENCOUNTER — Ambulatory Visit: Payer: No Typology Code available for payment source | Admitting: Internal Medicine

## 2019-05-15 VITALS — BP 120/86 | HR 82 | Ht 64.76 in | Wt 240.0 lb

## 2019-05-15 DIAGNOSIS — Z6841 Body Mass Index (BMI) 40.0 and over, adult: Secondary | ICD-10-CM

## 2019-05-15 DIAGNOSIS — E063 Autoimmune thyroiditis: Secondary | ICD-10-CM

## 2019-05-15 DIAGNOSIS — E038 Other specified hypothyroidism: Secondary | ICD-10-CM | POA: Diagnosis not present

## 2019-05-15 NOTE — Patient Instructions (Signed)
Please continue Levothyroxine 100 mcg daily.  Take the thyroid hormone every day, with water, at least 30 minutes before breakfast, separated by at least 4 hours from: - acid reflux medications - calcium - iron - multivitamins  Please come back for a follow-up appointment in 6 months.  Stop Biotin 2 weeks before the next appt.  Plant-based diet materials:  - Lectures (you tube):  Dr. Alyssa Grove: Breaking the Food Seduction  Dr. Layla Maw: How to Lose Weight, without Losing Your Mind  Dr. Legrand Como Greger: How Not To Die: The Role of Diet in Preventing, Arresting, and Reversing Our Top 15 Killers    - Documentaries:  Supersize Me  [place] over [place]: [place] 2 diet  - Facebook pages:   [place] versus [place]  - Programs:  The [employer] 2 Seven-Day Rescue Diet

## 2019-05-15 NOTE — Progress Notes (Signed)
Patient ID: Madison [name], female   DOB: 1981-08-12, 38 y.o.   MRN: [id number]    HPI  Madison Cornersmily R Clifton is a 38 y.o.-year-old female, referred by Dr. Rinaldo RatelKadolph, for management of hypothyroidism due to Hashimoto's thyroiditis.  Pt. has been dx with hypothyroidism in 2014; she is on treatment with Levothyroxine (LT4).  Pt is on levothyroxine 100 mcg daily, taken: - in am - fasting - coffee + half and half at least 30 min after - at least 30 min from b'fast - no Ca, Fe, MVI, PPIs - not on Biotin  I reviewed pt's thyroid tests: Lab Results  Component Value Date   TSH 1.740 04/13/2019   TSH 2.490 12/14/2018   TSH 2.030 08/24/2018   TSH 4.214 11/24/2016   FREET4 1.30 04/13/2019   FREET4 1.41 08/24/2018    Lab Results  Component Value Date   T3FREE 2.9 04/13/2019   Her thyroid antibodies were elevated: Component     Latest Ref Rng & Units 03/30/2019  Thyroperoxidase Ab SerPl-aCnc     0 - 34 IU/mL 108 (H)  Thyroglobulin Antibody     0.0 - 0.9 IU/mL 4.7 (H)   Pt describes: - weight gain - she was seen a dose, but she did not lose weight in the [facility] weight loss program - fatigue - cold intolerance - no depression or anxiety - no constipation - + hair thinning-She started Biotin 5000 mcg daily 2 weeks ago.  Pt denies feeling nodules in neck. She has hoarseness (possibly related to allergies - sees allergologist). No dysphagia/odynophagia, SOB with lying down.  She has + FH of thyroid disorders in: mother. No FH of thyroid cancer. No FH of autoimmune ds - ? In mother. No h/o radiation tx to head or neck. No recent use of iodine supplements.  Pt. also has a history of SVT - On cardizem. Also, HL, GERD.  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  ROS: Constitutional: + See HPI , + poor sleep  Eyes: no blurry vision, no xerophthalmia ENT: + Sore throat,  + see HPI Cardiovascular: no CP/SOB/+ palpitations/no leg swelling Respiratory: no cough/SOB Gastrointestinal: no N/V/D/C/+  acid reflux Musculoskeletal: + Both muscle/joint aches Skin: + rash on B dorsum of arms, + itching Neurological: no tremors/numbness/tingling/dizziness Psychiatric: no depression/anxiety  Past Medical History:  Diagnosis Date  . Allergy   . Anxiety   . Asthma   . Back pain   . Constipation   . Eczema   . Exercise-induced bronchospasm 06/12/2016  . GERD (gastroesophageal reflux disease)   . Herpes simplex disease   . Hyperlipidemia   . Hypothyroidism   . Joint pain   . Palpitation   . SOB (shortness of breath)   . SVT (supraventricular tachycardia) (HCC) 11/30/2016  . Swelling    bilat LE  . Urticaria    Past Surgical History:  Procedure Laterality Date  . Gum Grafting  11/2015, 06/2017  . KNEE ARTHROSCOPY Right 2001  . WISDOM TOOTH EXTRACTION Bilateral 1997   Social History   Socioeconomic History  . Marital status: Significant Other    Spouse name: Not on file  . Number of children: 0  . Years of education: Not on file  . Highest education level: Not on file  Occupational History  . Occupation: CMA  Social Needs  . Financial resource strain: Not on file  . Food insecurity    Worry: Not on file    Inability: Not on file  .  Transportation needs    Medical: Not on file    Non-medical: Not on file  Tobacco Use  . Smoking status: Never Smoker  . Smokeless tobacco: Never Used  Substance and Sexual Activity  . Alcohol use: Yes, wine-beer 1-2 times a week, 2-3 drinks  . Drug use: No  . Sexual activity: Not on file  Lifestyle  . Physical activity    Days per week:  3 to 5/week    Cardio, weight, yoga  previously [employer]   . Stress: Not on file  Social History Narrative   Lives in apartment with carpeting.  No pets.  Electric heat and central cooling.     Current Outpatient Medications on File Prior to Visit  Medication Sig Dispense Refill  . diltiazem (CARDIZEM) 30 MG tablet Take 1 tablet (30 mg total) by mouth as needed (SVT EPISODES). 30 tablet 3  .  hydrOXYzine (ATARAX/VISTARIL) 50 MG tablet TAKE 1 TO 2 TABLETS BY MOUTH EVERY 6 HOURS AS NEEDED FOR ITCHIN  1  . levothyroxine (SYNTHROID, LEVOTHROID) 100 MCG tablet Take 100 mcg by mouth daily before breakfast.    . loratadine (CLARITIN) 10 MG tablet Take 10 mg by mouth daily.    . mometasone (NASONEX) 50 MCG/ACT nasal spray Place 2 sprays into the nose daily. 17 g 5  . montelukast (SINGULAIR) 10 MG tablet Take 1 tablet (10 mg total) by mouth at bedtime. 30 tablet 5  . Pitavastatin Calcium (LIVALO PO) Take 2 mg by mouth daily.     . TRI FEMYNOR 0.18/0.215/0.25 MG-35 MCG tablet Take 1 tablet by mouth daily.  3  . diltiazem (CARDIZEM CD) 240 MG 24 hr capsule Take 1 capsule (240 mg total) by mouth daily. 30 capsule 2   No current facility-administered medications on file prior to visit.    Allergies  Allergen Reactions  . Atorvastatin Other (See Comments)    Joint pain  . Crestor [Rosuvastatin Calcium] Swelling  . Latex Rash  . Neosporin [Neomycin-Bacitracin Zn-Polymyx] Rash  . Toradol [Ketorolac Tromethamine] Rash   Family History  Problem Relation Age of Onset  . Eczema Father   . Sleep apnea Father   . High blood pressure Mother   . High Cholesterol Mother   . Thyroid disease Mother   . Allergic rhinitis Neg Hx   . Angioedema Neg Hx   . Asthma Neg Hx   . Atopy Neg Hx   . Immunodeficiency Neg Hx   . Urticaria Neg Hx     PE: BP 120/86   Pulse 82   Ht 5' 4.76" (1.645 m) Comment: measured without shoes  Wt 240 lb (108.9 kg)   BMI 40.23 kg/m  Wt Readings from Last 3 Encounters:  05/15/19 240 lb (108.9 kg)  03/30/19 237 lb (107.5 kg)  01/16/19 224 lb (101.6 kg)   Constitutional: overweight, in NAD Eyes: PERRLA, EOMI, no exophthalmos ENT: moist mucous membranes, no thyromegaly, no cervical lymphadenopathy Cardiovascular: RRR, No MRG Respiratory: CTA B Gastrointestinal: abdomen soft, NT, ND, BS+ Musculoskeletal: no deformities, strength intact in all 4 Skin: moist,  warm, + rash on dorsum of bilateral arms Neurological: no tremor with outstretched hands, DTR normal in all 4  ASSESSMENT: 1.  Hypothyroidism  2. Hashimoto's thyroiditis  3. Obesity  PLAN: 1. Hypothyroidism - latest thyroid labs reviewed with pt >> normal 04/2019 - she continues on LT4 100 mcg daily - pt feels good on this dose. - we discussed about taking the thyroid hormone every day, with  water, >30 minutes before breakfast, separated by >4 hours from acid reflux medications, calcium, iron, multivitamins. Pt. is taking it correctly. - will check thyroid tests at next visit  2. Hashimoto thyroiditis - we reviewed her thyroid Ab levels and explained that they are elevated pointing towards thyroid autoimmunity - we had a long discussion about her Hashimoto thyroiditis diagnosis. I explained that this is an autoimmune disorder, in which she develops antibodies against her own thyroid. The antibodies bind to the thyroid tissue and cause inflammation, and, eventually, destruction of the gland and hypothyroidism. We don't know how long this process can be, it can last from months to years. - I also explained that thyroid enlargement especially at the beginning of her Hashimoto thyroiditis course is not uncommon, and it has a waxing and waning character.  - We discussed about treatment for Hashimoto thyroiditis, which is actually limited to thyroid hormones in case her TFTs are abnormal. Supplements like selenium has been tried with various results, some showing improvement in the TPO antibodies. However, there are no randomized controlled trials of this are consistent results between trials. We also discussed about ways to improve her immune system (relaxation, diet, exercise, sleep) to reduce the Ab titer and, subsequently, the thyroid inflammation.  3. Obesity -She tells me that [facility] Weight loss program but did not work for her -We discussed about the following changes in her  diet:  Decreasing the amount of high-calorie foods like cheese, fatty meats  Decreasing concentrated sweets (she does not eat a lot of these)  Switching towards a more plant-based diet (given references)  No snacking between meals  Spacing meals approximately 5 hours apart  Intermittent fasting with time restricted feeding -feeding window 6-8 hours   Carlus Pavlovristina Babita Amaker, MD PhD [facility]

## 2019-05-17 ENCOUNTER — Telehealth: Payer: No Typology Code available for payment source | Admitting: Family

## 2019-05-17 ENCOUNTER — Other Ambulatory Visit: Payer: Self-pay | Admitting: Cardiology

## 2019-05-17 DIAGNOSIS — R21 Rash and other nonspecific skin eruption: Secondary | ICD-10-CM

## 2019-05-17 MED ORDER — PREDNISONE 10 MG (21) PO TBPK: ORAL_TABLET | ORAL | 0 refills | Status: DC

## 2019-05-17 MED FILL — OMEPRAZOLE 20 MG CPDR: 20 | 90 days supply | Qty: 90 | Fill #0

## 2019-05-17 MED FILL — CARTIA XT 240 MG CAPSULE SA: 240 | 90 days supply | Qty: 90 | Fill #0

## 2019-05-17 MED FILL — predniSONE 10 MG (21) TBPK: 10 | 6 days supply | Qty: 21 | Fill #0

## 2019-05-17 MED FILL — hydrOXYzine HCL 50 MG TABS: 50 | 12 days supply | Qty: 90 | Fill #1

## 2019-05-17 MED FILL — MOMETASONE FUROATE 50 MCG S: 50 | 30 days supply | Qty: 17 | Fill #1

## 2019-05-17 MED FILL — TRIAMCINOLONE ACETONIDE 0.5: 0.5 | 30 days supply | Qty: 15 | Fill #1

## 2019-05-17 NOTE — Progress Notes (Signed)
E Visit for Rash  We are sorry that you are not feeling well. Here is how we plan to help!  Based on what you shared with me it looks like you have  Dermatitis. Dermatitis is a skin rash caused by something that touches the skin and causes irritation or inflammation.  Your skin may be red, swollen, dry, cracked, and itch.  The rash should go away in a few days but can last a few weeks.  If you get a rash, it's important to figure out what caused it so the irritant can be avoided in the future.   Prednisone 10 mg daily for 6 days (see taper instructions below)  Directions for 6 day taper: Day 1: 2 tablets before breakfast, 1 after both lunch & dinner and 2 at bedtime Day 2: 1 tab before breakfast, 1 after both lunch & dinner and 2 at bedtime Day 3: 1 tab at each meal & 1 at bedtime Day 4: 1 tab at breakfast, 1 at lunch, 1 at bedtime Day 5: 1 tab at breakfast & 1 tab at bedtime Day 6: 1 tab at breakfast   Approximately 5 minutes was spent documenting and reviewing patient's chart.    HOME CARE:   Take cool showers and avoid direct sunlight.  Apply cool compress or wet dressings.  Take a bath in an oatmeal bath.  Sprinkle content of one Aveeno packet under running faucet with comfortably warm water.  Bathe for 15-20 minutes, 1-2 times daily.  Pat dry with a towel. Do not rub the rash.  Use hydrocortisone cream.  Take an antihistamine like Benadryl for widespread rashes that itch.  The adult dose of Benadryl is 25-50 mg by mouth 4 times daily.  Caution:  This type of medication may cause sleepiness.  Do not drink alcohol, drive, or operate dangerous machinery while taking antihistamines.  Do not take these medications if you have prostate enlargement.  Read package instructions thoroughly on all medications that you take.  GET HELP RIGHT AWAY IF:   Symptoms don't go away after treatment.  Severe itching that persists.  If you rash spreads or swells.  If you rash begins to  smell.  If it blisters and opens or develops a yellow-brown crust.  You develop a fever.  You have a sore throat.  You become short of breath.  MAKE SURE YOU:  Understand these instructions. Will watch your condition. Will get help right away if you are not doing well or get worse.  Thank you for choosing an e-visit. Your e-visit answers were reviewed by a board certified advanced clinical practitioner to complete your personal care plan. Depending upon the condition, your plan could have included both over the counter or prescription medications. Please review your pharmacy choice. Be sure that the pharmacy you have chosen is open so that you can pick up your prescription now.  If there is a problem you may message your provider in [place] to have the prescription routed to another pharmacy. Your safety is important to [place]. If you have drug allergies check your prescription carefully.  For the next 24 hours, you can use MyChart to ask questions about today's visit, request a non-urgent call back, or ask for a work or school excuse from your e-visit provider. You will get an email in the next two days asking about your experience. I hope that your e-visit has been valuable and will speed your recovery.

## 2019-06-01 MED FILL — TRI FEMYNOR 28 TABLET: 0.18/0.215/ | 84 days supply | Qty: 84 | Fill #0

## 2019-06-01 MED FILL — LEVOTHYROXINE 100 MCG TAB: 100 | 90 days supply | Qty: 90 | Fill #0

## 2019-08-25 MED FILL — LEVOTHYROXINE 100 MCG TAB: 100 | 30 days supply | Qty: 30 | Fill #1

## 2019-08-25 MED FILL — hydrOXYzine HCL 50 MG TABS: 50 | 11 days supply | Qty: 90 | Fill #0

## 2019-08-25 MED FILL — MOMETASONE FUROATE 50 MCG S: 50 | 30 days supply | Qty: 17 | Fill #2

## 2019-08-25 MED FILL — CARTIA XT 240 MG CAPSULE SA: 240 | 30 days supply | Qty: 30 | Fill #1

## 2019-08-25 MED FILL — NORGESTIM-ETH ESTRAD TRIPHA: 0.18/0.215/ | 84 days supply | Qty: 84 | Fill #1

## 2019-09-28 MED FILL — LEVOTHYROXINE 100 MCG TAB: 100 | 30 days supply | Qty: 30 | Fill #2

## 2019-09-28 MED FILL — TRIAMCINOLONE ACETONIDE 0.5: 0.5 | 30 days supply | Qty: 15 | Fill #2

## 2019-09-28 MED FILL — CARTIA XT 240 MG CAPSULE SA: 240 | 30 days supply | Qty: 30 | Fill #2

## 2019-10-04 ENCOUNTER — Ambulatory Visit: Payer: Self-pay | Admitting: Allergy

## 2019-10-04 ENCOUNTER — Ambulatory Visit: Payer: No Typology Code available for payment source | Admitting: Allergy

## 2019-10-18 ENCOUNTER — Encounter: Payer: Self-pay | Admitting: Internal Medicine

## 2019-10-31 MED FILL — CARTIA XT 240 MG CAPSULE SA: 240 | 30 days supply | Qty: 30 | Fill #3

## 2019-11-01 MED FILL — LEVOTHYROXINE SODIUM 100 MC: 100 | 30 days supply | Qty: 30 | Fill #3

## 2019-11-02 ENCOUNTER — Telehealth: Payer: No Typology Code available for payment source | Admitting: Physician Assistant

## 2019-11-02 DIAGNOSIS — J019 Acute sinusitis, unspecified: Secondary | ICD-10-CM | POA: Diagnosis not present

## 2019-11-02 MED ORDER — AMOXICILLIN-POT CLAVULANATE 875-125 MG PO TABS: 1.0000 | ORAL_TABLET | Freq: Two times a day (BID) | ORAL | 0 refills | Status: DC

## 2019-11-02 MED FILL — AMOX-CLAV 875-125 MG TABLET: 875-125 | 7 days supply | Qty: 14 | Fill #0

## 2019-11-02 NOTE — Progress Notes (Signed)
We are sorry that you are not feeling well.  Here is how we plan to help!  Based on what you have shared with me it looks like you have sinusitis.  Sinusitis is inflammation and infection in the sinus cavities of the head.  Based on your presentation I believe you most likely have Acute Bacterial Sinusitis.  This is an infection caused by bacteria and is treated with antibiotics. I have prescribed Augmentin 875mg/125mg one tablet twice daily with food, for 7 days. You may use an oral decongestant such as Mucinex D or if you have glaucoma or high blood pressure use plain Mucinex. Saline nasal spray help and can safely be used as often as needed for congestion.  If you develop worsening sinus pain, fever or notice severe headache and vision changes, or if symptoms are not better after completion of antibiotic, please schedule an appointment with a health care provider.    Sinus infections are not as easily transmitted as other respiratory infection, however we still recommend that you avoid close contact with loved ones, especially the very young and elderly.  Remember to wash your hands thoroughly throughout the day as this is the number one way to prevent the spread of infection!  Home Care:  Only take medications as instructed by your medical team.  Complete the entire course of an antibiotic.  Do not take these medications with alcohol.  A steam or ultrasonic humidifier can help congestion.  You can place a towel over your head and breathe in the steam from hot water coming from a faucet.  Avoid close contacts especially the very young and the elderly.  Cover your mouth when you cough or sneeze.  Always remember to wash your hands.  Get Help Right Away If:  You develop worsening fever or sinus pain.  You develop a severe head ache or visual changes.  Your symptoms persist after you have completed your treatment plan.  Make sure you  Understand these instructions.  Will watch your  condition.  Will get help right away if you are not doing well or get worse.  Your e-visit answers were reviewed by a board certified advanced clinical practitioner to complete your personal care plan.  Depending on the condition, your plan could have included both over the counter or prescription medications.  If there is a problem please reply  once you have received a response from your provider.  Your safety is important to us.  If you have drug allergies check your prescription carefully.    You can use MyChart to ask questions about today's visit, request a non-urgent call back, or ask for a work or school excuse for 24 hours related to this e-Visit. If it has been greater than 24 hours you will need to follow up with your provider, or enter a new e-Visit to address those concerns.  You will get an e-mail in the next two days asking about your experience.  I hope that your e-visit has been valuable and will speed your recovery. Thank you for using e-visits.  Zoraya Fiorenza PA-C  Approximately 5 minutes was spent documenting and reviewing patient's chart.    

## 2019-11-13 ENCOUNTER — Ambulatory Visit: Payer: No Typology Code available for payment source | Admitting: Internal Medicine

## 2019-11-24 MED FILL — TRI FEMYNOR 28 TABLET: 0.18/0.215/ | 84 days supply | Qty: 84 | Fill #0

## 2019-11-30 MED FILL — LEVOTHYROXINE 100 MCG TABLE: 100 | 90 days supply | Qty: 90 | Fill #0

## 2019-11-30 MED FILL — CARTIA XT 240 MG CAPSULE SA: 240 | 90 days supply | Qty: 90 | Fill #0

## 2019-12-01 ENCOUNTER — Ambulatory Visit: Payer: No Typology Code available for payment source | Admitting: Internal Medicine

## 2019-12-13 ENCOUNTER — Encounter: Payer: Self-pay | Admitting: Internal Medicine

## 2019-12-13 NOTE — Progress Notes (Signed)
Received labs PCP, drawn 12/08/2019: TPO antibodies 111 (0 to 34), ATA antibodies 10.5 (0-0.9) TSH 2.14 (0.34-4.5) We will scan the results.

## 2019-12-27 MED FILL — MOMETASONE FUROATE 50 MCG S: 50 | 30 days supply | Qty: 17 | Fill #0

## 2019-12-27 MED FILL — TRIAMCINOLONE 0.5% CREAM: 0.5 | 15 days supply | Qty: 30 | Fill #0

## 2020-01-03 ENCOUNTER — Other Ambulatory Visit: Payer: Self-pay

## 2020-01-04 ENCOUNTER — Encounter: Payer: Self-pay | Admitting: Internal Medicine

## 2020-01-04 ENCOUNTER — Ambulatory Visit: Payer: No Typology Code available for payment source | Admitting: Internal Medicine

## 2020-01-04 ENCOUNTER — Other Ambulatory Visit: Payer: Self-pay

## 2020-01-04 VITALS — BP 110/80 | HR 85 | Ht 64.76 in | Wt 232.0 lb

## 2020-01-04 DIAGNOSIS — E038 Other specified hypothyroidism: Secondary | ICD-10-CM

## 2020-01-04 DIAGNOSIS — E063 Autoimmune thyroiditis: Secondary | ICD-10-CM

## 2020-01-04 NOTE — Progress Notes (Signed)
Patient ID: Madison [name], female   DOB: 02-Nov-1981, 39 y.o.   MRN: [id number]   This visit occurred during the SARS-CoV-2 public health emergency.  Safety protocols were in place, including screening questions prior to the visit, additional usage of staff PPE, and extensive cleaning of exam room while observing appropriate contact time as indicated for disinfecting solutions.   HPI  Madison [name] is a 39 y.o.-year-old female, initially referred by Dr. Rinaldo Ratel, returning for follow-up hypothyroidism due to Hashimoto's thyroiditis.  Last visit 7 months ago.  Since last visit, she started a plant-based diet. She occasionally eats meats, but inconsistently.  She eliminated cheese completely. She lost 7 pounds and feels much better.    She was diagnosed with hypothyroidism in 2014 and with Hashimoto's thyroiditis in 03/2019.  Pt is on levothyroxine 100 mcg daily, taken: - in am - fasting - Coffee + half and half -at least 30-60 minutes after LT4 - at least 30 min from b'fast - no Fe, MVI, PPIs - + calcium + vitamin D later in the day - + Tums later in the day - not on Biotin  Reviewed patient's TFTs: 12/08/2019: TSH 2.14 (0.34-4.5) Lab Results  Component Value Date   TSH 1.740 04/13/2019   TSH 2.490 12/14/2018   TSH 2.030 08/24/2018   TSH 4.214 11/24/2016   FREET4 1.30 04/13/2019   FREET4 1.41 08/24/2018    Lab Results  Component Value Date   T3FREE 2.9 04/13/2019   Her thyroid antibodies were elevated: 12/08/2019: TPO antibodies 111 (0 to 34), ATA antibodies 10.5 (0-0.9) Component     Latest Ref Rng & Units 03/30/2019  Thyroperoxidase Ab SerPl-aCnc     0 - 34 IU/mL 108 (H)  Thyroglobulin Antibody     0.0 - 0.9 IU/mL 4.7 (H)   Pt denies: - feeling nodules in neck - dysphagia - choking - SOB with lying down She has hoarseness possibly related to allergies-sees allergologist.  She has + FH of thyroid disorders in: mother. ? autoimmune disease in mother. No FH of thyroid  cancer. No h/o radiation tx to head or neck. No herbal supplements. No Biotin use. No recent steroids use.   Pt. also has a history of SVT - On cardizem.  Also HL, GERD.  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  ROS:  Constitutional: no weight gain/+ weight loss, no fatigue, no subjective hyperthermia, no subjective hypothermia Eyes: no blurry vision, no xerophthalmia ENT: no sore throat, + see HPI Cardiovascular: no CP/no SOB/no palpitations/no leg swelling Respiratory: no cough/no SOB/no wheezing Gastrointestinal: no N/no V/no D/no C/+ acid reflux Musculoskeletal: no muscle aches/no joint aches Skin: no rashes, no hair loss Neurological: no tremors/no numbness/no tingling/no dizziness  I reviewed pt's medications, allergies, PMH, social hx, family hx, and changes were documented in the history of present illness. Otherwise, unchanged from my initial visit note.  Past Medical History:  Diagnosis Date  . Allergy   . Anxiety   . Asthma   . Back pain   . Constipation   . Eczema   . Exercise-induced bronchospasm 06/12/2016  . GERD (gastroesophageal reflux disease)   . Herpes simplex disease   . Hyperlipidemia   . Hypothyroidism   . Joint pain   . Palpitation   . SOB (shortness of breath)   . SVT (supraventricular tachycardia) (HCC) 11/30/2016  . Swelling    bilat LE  . Urticaria    Past Surgical History:  Procedure Laterality Date  .  Gum Grafting  11/2015, 06/2017  . KNEE ARTHROSCOPY Right 2001  . WISDOM TOOTH EXTRACTION Bilateral 1997   Social History   Socioeconomic History  . Marital status: Significant Other    Spouse name: Not on file  . Number of children: 0  . Years of education: Not on file  . Highest education level: Not on file  Occupational History  . Occupation: CMA  Social Needs  . Financial resource strain: Not on file  . Food insecurity    Worry: Not on file    Inability: Not on file  . Transportation needs    Medical: Not on file    Non-medical:  Not on file  Tobacco Use  . Smoking status: Never Smoker  . Smokeless tobacco: Never Used  Substance and Sexual Activity  . Alcohol use: Yes, wine-beer 1-2 times a week, 2-3 drinks  . Drug use: No  . Sexual activity: Not on file  Lifestyle  . Physical activity    Days per week:  3 to 5/week    Cardio, weight, yoga  previously [employer]   . Stress: Not on file  Social History Narrative   Lives in apartment with carpeting.  No pets.  Electric heat and central cooling.     Current Outpatient Medications on File Prior to Visit  Medication Sig Dispense Refill  . amoxicillin-clavulanate (AUGMENTIN) 875-125 MG tablet Take 1 tablet by mouth 2 (two) times daily. 14 tablet 0  . CARTIA XT 240 MG 24 hr capsule TAKE 1 CAPSULE (240 MG TOTAL) BY MOUTH DAILY. 90 capsule 1  . diltiazem (CARDIZEM) 30 MG tablet Take 1 tablet (30 mg total) by mouth as needed (SVT EPISODES). 30 tablet 3  . hydrOXYzine (ATARAX/VISTARIL) 50 MG tablet TAKE 1 TO 2 TABLETS BY MOUTH EVERY 6 HOURS AS NEEDED FOR ITCHIN  1  . levothyroxine (SYNTHROID, LEVOTHROID) 100 MCG tablet Take 100 mcg by mouth daily before breakfast.    . loratadine (CLARITIN) 10 MG tablet Take 10 mg by mouth daily.    . mometasone (NASONEX) 50 MCG/ACT nasal spray Place 2 sprays into the nose daily. 17 g 5  . montelukast (SINGULAIR) 10 MG tablet Take 1 tablet (10 mg total) by mouth at bedtime. 30 tablet 5  . Pitavastatin Calcium (LIVALO PO) Take 2 mg by mouth daily.     . predniSONE (STERAPRED UNI-PAK 21 TAB) 10 MG (21) TBPK tablet Use as directed 21 tablet 0  . TRI FEMYNOR 0.18/0.215/0.25 MG-35 MCG tablet Take 1 tablet by mouth daily.  3   No current facility-administered medications on file prior to visit.   Allergies  Allergen Reactions  . Atorvastatin Other (See Comments)    Joint pain  . Crestor [Rosuvastatin Calcium] Swelling  . Latex Rash  . Neosporin [Neomycin-Bacitracin Zn-Polymyx] Rash  . Toradol [Ketorolac Tromethamine] Rash   Family  History  Problem Relation Age of Onset  . Eczema Father   . Sleep apnea Father   . High blood pressure Mother   . High Cholesterol Mother   . Thyroid disease Mother   . Allergic rhinitis Neg Hx   . Angioedema Neg Hx   . Asthma Neg Hx   . Atopy Neg Hx   . Immunodeficiency Neg Hx   . Urticaria Neg Hx     PE: BP 110/80   Pulse 85   Ht 5' 4.76" (1.645 m)   Wt 232 lb (105.2 kg)   SpO2 97%   BMI 38.89 kg/m  Wt Readings  from Last 3 Encounters:  01/04/20 232 lb (105.2 kg)  05/15/19 240 lb (108.9 kg)  03/30/19 237 lb (107.5 kg)   Constitutional: overweight, in NAD Eyes: PERRLA, EOMI, no exophthalmos ENT: moist mucous membranes, no thyromegaly, no cervical lymphadenopathy Cardiovascular: RRR, No MRG Respiratory: CTA B Gastrointestinal: abdomen soft, NT, ND, BS+ Musculoskeletal: no deformities, strength intact in all 4 Skin: moist, warm, no rashes Neurological: no tremor with outstretched hands, DTR normal in all 4  ASSESSMENT: 1.  Hypothyroidism 2/2 Hashimoto's thyroiditis  2. Obesity class II  PLAN: 1. Hypothyroidism 2/2 Hashimoto's thyroiditis - latest thyroid labs reviewed with pt >> normal at last check by PCP in 11/2019.  At that time, her TPO antibodies and ATA antibodies were slightly worse.  At this visit, I recommended selenium 200 mcg to further decrease bodies.  We will recheck these at next visit.  - she continues on LT4 100 mcg daily - pt feels good on this dose. - we discussed about taking the thyroid hormone every day, with water, >30 minutes before breakfast, separated by >4 hours from acid reflux medications, calcium, iron, multivitamins. Pt. is taking it correctly. - will recheck her thyroid tests when she returns to the clinic  2. Obesity class II -She tried the: Weight loss program but did not work for her -At last visit we discussed about the following changes in her diet:  Decreasing the amount of high-calorie foods like cheese, fatty  meats  Decreasing concentrated sweets (she does not eat a lot of these)  Switching towards a more plant-based diet (given references)  No snacking between meals  Spacing meals approximately 5 hours apart  Intermittent fasting with time restricted feeding -feeding window 6-8 hours - she started the plant-based Engine 2 diet >> feels better - continues on the diet with few indiscretions. -She lost 7 pounds since last visit.  Her BMI is now in the class II obesity range, decreased from class III.  Philemon Kingdom, MD PhD [facility]

## 2020-01-04 NOTE — Patient Instructions (Addendum)
Please continue Levothyroxine 100 mcg daily.  Take the thyroid hormone every day, with water, at least 30 minutes before breakfast, separated by at least 4 hours from: - acid reflux medications - calcium - iron - multivitamins  Try Selenium 200 mcg daily.  Please come back for a follow-up appointment in 6 months.

## 2020-01-15 MED FILL — LIVALO 2 MG TABLET: 2 | 30 days supply | Qty: 30 | Fill #0

## 2020-02-12 ENCOUNTER — Telehealth: Payer: No Typology Code available for payment source | Admitting: Family

## 2020-02-12 DIAGNOSIS — L243 Irritant contact dermatitis due to cosmetics: Secondary | ICD-10-CM | POA: Diagnosis not present

## 2020-02-12 MED ORDER — TRIAMCINOLONE ACETONIDE 0.5 % EX OINT: 1.0000 "application " | TOPICAL_OINTMENT | Freq: Two times a day (BID) | CUTANEOUS | 0 refills | Status: DC

## 2020-02-12 MED FILL — TRIAMCINOLONE 0.5% OINTMENT: 0.5 | 14 days supply | Qty: 30 | Fill #0

## 2020-02-12 NOTE — Progress Notes (Signed)
E Visit for Rash  We are sorry that you are not feeling well. Here is how we plan to help!  Based on what you shared with me it looks like you have contact dermatitis.  Contact dermatitis is a skin rash caused by something that touches the skin and causes irritation or inflammation.  Your skin may be red, swollen, dry, cracked, and itch.  The rash should go away in a few days but can last a few weeks.  If you get a rash, it's important to figure out what caused it so the irritant can be avoided in the future.   I believe some of the products they used for your pedicure caused a reaction. I have sent in a prescription of Kenalog cream that you will apply twice a day. Avoid applying on your face or groin.    HOME CARE:   Take cool showers and avoid direct sunlight.  Apply cool compress or wet dressings.  Take a bath in an oatmeal bath.  Sprinkle content of one Aveeno packet under running faucet with comfortably warm water.  Bathe for 15-20 minutes, 1-2 times daily.  Pat dry with a towel. Do not rub the rash.  Use hydrocortisone cream.  Take an antihistamine like Benadryl for widespread rashes that itch.  The adult dose of Benadryl is 25-50 mg by mouth 4 times daily.  Caution:  This type of medication may cause sleepiness.  Do not drink alcohol, drive, or operate dangerous machinery while taking antihistamines.  Do not take these medications if you have prostate enlargement.  Read package instructions thoroughly on all medications that you take.  GET HELP RIGHT AWAY IF:   Symptoms don't go away after treatment.  Severe itching that persists.  If you rash spreads or swells.  If you rash begins to smell.  If it blisters and opens or develops a yellow-brown crust.  You develop a fever.  You have a sore throat.  You become short of breath.  MAKE SURE YOU:  Understand these instructions. Will watch your condition. Will get help right away if you are not doing well or get  worse.  Thank you for choosing an e-visit. Your e-visit answers were reviewed by a board certified advanced clinical practitioner to complete your personal care plan. Depending upon the condition, your plan could have included both over the counter or prescription medications. Please review your pharmacy choice. Be sure that the pharmacy you have chosen is open so that you can pick up your prescription now.  If there is a problem you may message your provider in MyChart to have the prescription routed to another pharmacy. Your safety is important to [place]. If you have drug allergies check your prescription carefully.  For the next 24 hours, you can use MyChart to ask questions about today's visit, request a non-urgent call back, or ask for a work or school excuse from your e-visit provider. You will get an email in the next two days asking about your experience. I hope that your e-visit has been valuable and will speed your recovery.   Approximately 5 minutes was spent documenting and reviewing patient's chart.

## 2020-02-14 MED FILL — LIVALO 2 MG TABLET: 2 | 30 days supply | Qty: 30 | Fill #1

## 2020-02-14 MED FILL — TRI FEMYNOR 28 TABLET: 0.18/0.215/ | 84 days supply | Qty: 84 | Fill #1

## 2020-02-29 MED FILL — LEVOTHYROXINE 100 MCG TABLE: 100 | 30 days supply | Qty: 30 | Fill #1

## 2020-02-29 MED FILL — CARTIA XT 240 MG CAPSULE SA: 240 | 30 days supply | Qty: 30 | Fill #1

## 2020-03-27 ENCOUNTER — Other Ambulatory Visit (HOSPITAL_COMMUNITY): Payer: Self-pay | Admitting: Physician Assistant

## 2020-03-27 MED FILL — LIVALO 2 MG TABLET: 2 | 30 days supply | Qty: 30 | Fill #0

## 2020-03-28 MED FILL — OMEPRAZOLE DR 20 MG CAPSULE: 20 | 30 days supply | Qty: 30 | Fill #0

## 2020-03-28 MED FILL — LEVOTHYROXINE 100 MCG TABLE: 100 | 30 days supply | Qty: 30 | Fill #2

## 2020-03-28 MED FILL — CARTIA XT 240 MG CAPSULE SA: 240 | 30 days supply | Qty: 30 | Fill #2

## 2020-04-30 ENCOUNTER — Other Ambulatory Visit: Payer: Self-pay | Admitting: Allergy

## 2020-04-30 MED FILL — TRI FEMYNOR 28 TABLET: 0.18/0.215/ | 84 days supply | Qty: 84 | Fill #2

## 2020-04-30 MED FILL — LEVOTHYROXINE 100 MCG TABLE: 100 | 30 days supply | Qty: 30 | Fill #3

## 2020-04-30 MED FILL — LIVALO 2 MG TABLET: 2 | 30 days supply | Qty: 30 | Fill #1

## 2020-04-30 MED FILL — CARTIA XT 240 MG CAPSULE SA: 240 | 30 days supply | Qty: 30 | Fill #3

## 2020-04-30 MED FILL — MOMETASONE FUROATE 50 MCG S: 50 | 30 days supply | Qty: 17 | Fill #0

## 2020-05-31 ENCOUNTER — Other Ambulatory Visit: Payer: Self-pay | Admitting: Cardiology

## 2020-05-31 MED FILL — LIVALO 2 MG TABLET: 2 | 30 days supply | Qty: 30 | Fill #2

## 2020-05-31 MED FILL — LEVOTHYROXINE 100 MCG TABLE: 100 | 30 days supply | Qty: 30 | Fill #0

## 2020-05-31 MED FILL — CARTIA XT 240 MG CAPSULE SA: 240 | 30 days supply | Qty: 30 | Fill #0

## 2020-07-04 ENCOUNTER — Ambulatory Visit: Payer: No Typology Code available for payment source | Admitting: Internal Medicine

## 2020-07-05 MED FILL — LIVALO 2 MG TABLET: 2 | 30 days supply | Qty: 30 | Fill #3

## 2020-07-05 MED FILL — LEVOTHYROXINE 100 MCG TABLE: 100 | 30 days supply | Qty: 30 | Fill #1

## 2020-07-05 MED FILL — CARTIA XT 240 MG CAPSULE SA: 240 | 30 days supply | Qty: 30 | Fill #1

## 2020-08-01 MED FILL — VALACYCLOVIR HCL 500 MG TAB: 500 | 30 days supply | Qty: 30 | Fill #0

## 2020-08-02 MED FILL — CARTIA XT 240 MG CAPSULE SA: 240 | 30 days supply | Qty: 30 | Fill #2

## 2020-08-06 MED FILL — TRI FEMYNOR 28 TABLET: 0.18/0.215/ | 84 days supply | Qty: 84 | Fill #3

## 2020-08-08 MED FILL — traZODone HCL 50 MG TABS: 50 | 20 days supply | Qty: 40 | Fill #0

## 2020-08-09 ENCOUNTER — Other Ambulatory Visit (HOSPITAL_COMMUNITY): Payer: Self-pay | Admitting: Physician Assistant

## 2020-08-09 MED FILL — hydrOXYzine HCL 50 MG TABS: 50 | 11 days supply | Qty: 90 | Fill #0

## 2020-08-09 MED FILL — LIVALO 2 MG TABLET: 2 | 30 days supply | Qty: 30 | Fill #4

## 2020-08-09 MED FILL — LEVOTHYROXINE 100 MCG TABLE: 100 | 30 days supply | Qty: 30 | Fill #0

## 2020-09-03 ENCOUNTER — Other Ambulatory Visit: Payer: Self-pay | Admitting: Cardiology

## 2020-09-03 MED FILL — LEVOTHYROXINE 100 MCG TABLE: 100 | 30 days supply | Qty: 30 | Fill #1

## 2020-09-03 MED FILL — LIVALO 2 MG TABLET: 2 | 30 days supply | Qty: 30 | Fill #5

## 2020-09-03 MED FILL — CARTIA XT 240 MG CAPSULE SA: 240 | 30 days supply | Qty: 30 | Fill #0

## 2020-10-02 ENCOUNTER — Other Ambulatory Visit: Payer: Self-pay | Admitting: Cardiology

## 2020-10-02 MED FILL — CARTIA XT 240 MG CAPSULE SA: 240 | 30 days supply | Qty: 30 | Fill #0

## 2020-10-02 MED FILL — LIVALO 2 MG TABLET: 2 | 30 days supply | Qty: 30 | Fill #0

## 2020-10-02 MED FILL — LEVOTHYROXINE 100 MCG TABLE: 100 | 30 days supply | Qty: 30 | Fill #2

## 2020-10-28 ENCOUNTER — Other Ambulatory Visit (HOSPITAL_COMMUNITY): Payer: Self-pay | Admitting: Physician Assistant

## 2020-10-28 MED FILL — TRI FEMYNOR 28 TABLET: 0.18/0.215/ | 84 days supply | Qty: 84 | Fill #0

## 2020-10-31 ENCOUNTER — Other Ambulatory Visit: Payer: Self-pay | Admitting: Cardiology

## 2020-10-31 MED FILL — CARTIA XT 240 MG CAPSULE SA: 240 | 30 days supply | Qty: 30 | Fill #0

## 2020-11-05 ENCOUNTER — Other Ambulatory Visit (HOSPITAL_COMMUNITY): Payer: Self-pay | Admitting: Physician Assistant

## 2020-11-05 MED FILL — BELSOMRA 20 MG TABLET: 20 | 30 days supply | Qty: 30 | Fill #0

## 2020-11-05 MED FILL — LEVOTHYROXINE 100 MCG TABLE: 100 | 30 days supply | Qty: 30 | Fill #3

## 2020-11-05 MED FILL — LIVALO 2 MG TABLET: 2 | 30 days supply | Qty: 30 | Fill #1

## 2020-11-17 ENCOUNTER — Other Ambulatory Visit (HOSPITAL_COMMUNITY): Payer: Self-pay | Admitting: Nurse Practitioner

## 2020-11-17 ENCOUNTER — Telehealth: Payer: No Typology Code available for payment source | Admitting: Nurse Practitioner

## 2020-11-17 DIAGNOSIS — R21 Rash and other nonspecific skin eruption: Secondary | ICD-10-CM | POA: Diagnosis not present

## 2020-11-17 MED ORDER — PREDNISONE 10 MG PO TABS: ORAL_TABLET | ORAL | 0 refills | Status: AC

## 2020-11-17 NOTE — Progress Notes (Signed)
E Visit for Rash  We are sorry that you are not feeling well. Here is how we plan to help!  I am prescribing a 6-day prednisone taper for your symptoms.   Directions for 6 day taper: Day 1: 2 tablets before breakfast, 1 after both lunch & dinner and 2 at bedtime Day 2: 1 tab before breakfast, 1 after both lunch & dinner and 2 at bedtime Day 3: 1 tab at each meal & 1 at bedtime Day 4: 1 tab at breakfast, 1 at lunch, 1 at bedtime Day 5: 1 tab at breakfast & 1 tab at bedtime Day 6: 1 tab at breakfast   HOME CARE:   Take cool showers and avoid direct sunlight.  Apply cool compress or wet dressings.  Take a bath in an oatmeal bath.  Sprinkle content of one Aveeno packet under running faucet with comfortably warm water.  Bathe for 15-20 minutes, 1-2 times daily.  Pat dry with a towel. Do not rub the rash.  Use hydrocortisone cream.  Take an antihistamine like Benadryl for widespread rashes that itch.  The adult dose of Benadryl is 25-50 mg by mouth 4 times daily.  Caution:  This type of medication may cause sleepiness.  Do not drink alcohol, drive, or operate dangerous machinery while taking antihistamines.  Do not take these medications if you have prostate enlargement.  Read package instructions thoroughly on all medications that you take.  GET HELP RIGHT AWAY IF:   Symptoms don't go away after treatment.  Severe itching that persists.  If you rash spreads or swells.  If you rash begins to smell.  If it blisters and opens or develops a yellow-brown crust.  You develop a fever.  You have a sore throat.  You become short of breath.  MAKE SURE YOU:  Understand these instructions. Will watch your condition. Will get help right away if you are not doing well or get worse.  Thank you for choosing an e-visit. Your e-visit answers were reviewed by a board certified advanced clinical practitioner to complete your personal care plan. Depending upon the condition, your plan  could have included both over the counter or prescription medications. Please review your pharmacy choice. Be sure that the pharmacy you have chosen is open so that you can pick up your prescription now.  If there is a problem you may message your provider in MyChart to have the prescription routed to another pharmacy. Your safety is important to [place]. If you have drug allergies check your prescription carefully.  For the next 24 hours, you can use MyChart to ask questions about today's visit, request a non-urgent call back, or ask for a work or school excuse from your e-visit provider. You will get an email in the next two days asking about your experience. I hope that your e-visit has been valuable and will speed your recovery.  I have spent at least 5 minutes reviewing and documenting in the patient's chart.

## 2020-11-18 MED FILL — predniSONE 10 MG (21) TBPK: 10 | 6 days supply | Qty: 21 | Fill #0

## 2020-11-27 ENCOUNTER — Other Ambulatory Visit (HOSPITAL_COMMUNITY): Payer: Self-pay | Admitting: Physician Assistant

## 2020-11-27 MED FILL — TRIAMCINOLONE 0.5% CREAM: 0.5 | 90 days supply | Qty: 45 | Fill #0

## 2020-12-02 MED FILL — CARTIA XT 240 MG CAPSULE SA: 240 | 30 days supply | Qty: 30 | Fill #1

## 2020-12-05 MED FILL — LEVOTHYROXINE 100 MCG TABLE: 100 | 30 days supply | Qty: 30 | Fill #4

## 2021-01-03 ENCOUNTER — Other Ambulatory Visit: Payer: Self-pay | Admitting: Cardiology

## 2021-01-03 MED FILL — DILTIAZEM 24HR ER 240 MG CA: 240 | 30 days supply | Qty: 30 | Fill #0

## 2021-01-03 MED FILL — LEVOTHYROXINE 100 MCG TAB: 100 | 30 days supply | Qty: 30 | Fill #0

## 2021-01-17 MED FILL — NORGESTIM-ETH ESTRAD TRIPHA: 0.18/0.215/ | 84 days supply | Qty: 84 | Fill #0

## 2021-01-17 MED FILL — hydrOXYzine HCL 50 MG TABS: 50 | 30 days supply | Qty: 245 | Fill #0

## 2021-02-04 ENCOUNTER — Ambulatory Visit: Payer: No Typology Code available for payment source | Admitting: Cardiology

## 2021-02-07 MED FILL — DILTIAZEM 24HR ER 240 MG CA: 240 | 30 days supply | Qty: 30 | Fill #1

## 2021-02-07 MED FILL — LEVOTHYROXINE 100 MCG TAB: 100 | 30 days supply | Qty: 30 | Fill #1

## 2021-02-21 ENCOUNTER — Other Ambulatory Visit: Payer: Self-pay | Admitting: Cardiology

## 2021-02-21 ENCOUNTER — Other Ambulatory Visit (HOSPITAL_COMMUNITY): Payer: Self-pay

## 2021-02-21 MED ORDER — DILTIAZEM HCL ER COATED BEADS 240 MG PO CP24
240.0000 mg | ORAL_CAPSULE | Freq: Every day | ORAL | 2 refills | Status: DC
  Filled 2021-02-21: qty 30, 30d supply, fill #0
  Filled 2021-04-05: qty 30, 30d supply, fill #1
  Filled 2021-05-10: qty 30, 30d supply, fill #2

## 2021-02-21 MED FILL — Pitavastatin Calcium Tab 2 MG: ORAL | 90 days supply | Qty: 90 | Fill #0 | Status: CN

## 2021-02-21 MED FILL — Suvorexant Tab 20 MG: ORAL | 30 days supply | Qty: 30 | Fill #0 | Status: AC

## 2021-02-24 ENCOUNTER — Other Ambulatory Visit (HOSPITAL_COMMUNITY): Payer: Self-pay

## 2021-02-24 MED ORDER — LEVOTHYROXINE SODIUM 100 MCG PO TABS
ORAL_TABLET | ORAL | 1 refills | Status: DC
  Filled 2021-02-24 – 2021-03-05 (×2): qty 90, 90d supply, fill #0
  Filled 2021-06-10: qty 90, 90d supply, fill #1

## 2021-02-28 ENCOUNTER — Other Ambulatory Visit (HOSPITAL_COMMUNITY): Payer: Self-pay

## 2021-03-01 ENCOUNTER — Other Ambulatory Visit (HOSPITAL_COMMUNITY): Payer: Self-pay

## 2021-03-01 MED FILL — Pitavastatin Calcium Tab 2 MG: ORAL | 90 days supply | Qty: 90 | Fill #0 | Status: AC

## 2021-03-03 ENCOUNTER — Other Ambulatory Visit (HOSPITAL_COMMUNITY): Payer: Self-pay

## 2021-03-06 ENCOUNTER — Other Ambulatory Visit (HOSPITAL_COMMUNITY): Payer: Self-pay

## 2021-03-07 ENCOUNTER — Other Ambulatory Visit (HOSPITAL_COMMUNITY): Payer: Self-pay

## 2021-03-31 ENCOUNTER — Other Ambulatory Visit (HOSPITAL_COMMUNITY): Payer: Self-pay

## 2021-04-02 ENCOUNTER — Other Ambulatory Visit (HOSPITAL_COMMUNITY): Payer: Self-pay

## 2021-04-02 MED FILL — Norgestimate-Eth Estrad Tab 0.18-35/0.215-35/0.25-35 MG-MCG: ORAL | 84 days supply | Qty: 84 | Fill #0 | Status: AC

## 2021-04-03 MED FILL — Triamcinolone Acetonide Cream 0.5%: CUTANEOUS | 20 days supply | Qty: 45 | Fill #0 | Status: AC

## 2021-04-04 ENCOUNTER — Other Ambulatory Visit (HOSPITAL_COMMUNITY): Payer: Self-pay

## 2021-04-05 ENCOUNTER — Other Ambulatory Visit (HOSPITAL_COMMUNITY): Payer: Self-pay

## 2021-04-05 ENCOUNTER — Other Ambulatory Visit (HOSPITAL_BASED_OUTPATIENT_CLINIC_OR_DEPARTMENT_OTHER): Payer: Self-pay

## 2021-04-08 ENCOUNTER — Other Ambulatory Visit (HOSPITAL_COMMUNITY): Payer: Self-pay

## 2021-04-10 ENCOUNTER — Ambulatory Visit: Payer: No Typology Code available for payment source | Admitting: Cardiology

## 2021-04-21 ENCOUNTER — Other Ambulatory Visit (HOSPITAL_COMMUNITY): Payer: Self-pay

## 2021-04-21 MED ORDER — VALACYCLOVIR HCL 500 MG PO TABS
500.0000 mg | ORAL_TABLET | Freq: Every day | ORAL | 1 refills | Status: DC
  Filled 2021-04-21 – 2021-05-10 (×2): qty 90, 90d supply, fill #0

## 2021-04-29 ENCOUNTER — Other Ambulatory Visit (HOSPITAL_COMMUNITY): Payer: Self-pay

## 2021-05-02 ENCOUNTER — Ambulatory Visit: Payer: No Typology Code available for payment source | Admitting: Cardiology

## 2021-05-10 ENCOUNTER — Other Ambulatory Visit (HOSPITAL_COMMUNITY): Payer: Self-pay

## 2021-05-13 ENCOUNTER — Other Ambulatory Visit (HOSPITAL_COMMUNITY): Payer: Self-pay

## 2021-05-13 MED ORDER — DILTIAZEM HCL 30 MG PO TABS
30.0000 mg | ORAL_TABLET | ORAL | 1 refills | Status: DC | PRN
  Filled 2021-05-13: qty 30, 30d supply, fill #0
  Filled 2022-03-05: qty 30, 30d supply, fill #1

## 2021-06-09 ENCOUNTER — Encounter: Payer: Self-pay | Admitting: Cardiology

## 2021-06-09 ENCOUNTER — Ambulatory Visit: Payer: No Typology Code available for payment source | Admitting: Cardiology

## 2021-06-09 ENCOUNTER — Other Ambulatory Visit: Payer: Self-pay

## 2021-06-09 ENCOUNTER — Other Ambulatory Visit (HOSPITAL_COMMUNITY): Payer: Self-pay

## 2021-06-09 VITALS — BP 122/76 | HR 87 | Ht 64.0 in | Wt 247.0 lb

## 2021-06-09 DIAGNOSIS — I471 Supraventricular tachycardia: Secondary | ICD-10-CM | POA: Diagnosis not present

## 2021-06-09 MED ORDER — DILTIAZEM HCL ER COATED BEADS 300 MG PO CP24
300.0000 mg | ORAL_CAPSULE | Freq: Every day | ORAL | 3 refills | Status: DC
  Filled 2021-06-09: qty 90, 90d supply, fill #0
  Filled 2021-09-08: qty 90, 90d supply, fill #1
  Filled 2021-12-24: qty 90, 90d supply, fill #2
  Filled 2022-03-27: qty 90, 90d supply, fill #3

## 2021-06-09 NOTE — Progress Notes (Signed)
Electrophysiology Office Note   Date:  06/09/2021   ID:  Madison [name], DOB October 30, 1981, MRN [id number]  PCP:  Madison Height, PA  Cardiologist:  Madison [name] Primary Electrophysiologist:  Madison [name] Madison Loa, MD    No chief complaint on file.    History of Present Illness: Madison [name] is a 40 y.o. female who presents today for electrophysiology evaluation.     She has a history significant for hypothyroidism and SVT, both diagnosed 10 years ago.  She presented emergency room 11/24/2016 with palpitations and was found to be in SVT.  Heart rates were 1 and 70.  She converted to sinus rhythm prior to the emergency room physician seeing her.  She had another episode of SVT with heart rates of 210.  She had a single dose of Cardizem which converted her to sinus rhythm.  She had tried vagal maneuvers at home without any change.  Today, denies symptoms of palpitations, chest pain, shortness of breath, orthopnea, PND, lower extremity edema, claudication, dizziness, presyncope, syncope, bleeding, or neurologic sequela. The patient is tolerating medications without difficulties.  Since last being seen, she has had more frequent episodes of SVT.  Her SVT episodes last for few minutes at a time.  She has had an episode that lasted up to a few hours.  She took an extra dose of diltiazem which greatly improved her symptoms there have been no exacerbating or alleviating factors.   Past Medical History:  Diagnosis Date   Allergy    Anxiety    Asthma    Back pain    Constipation    Eczema    Exercise-induced bronchospasm 06/12/2016   GERD (gastroesophageal reflux disease)    Herpes simplex disease    Hyperlipidemia    Hypothyroidism    Joint pain    Palpitation    SOB (shortness of breath)    SVT (supraventricular tachycardia) (HCC) 11/30/2016   Swelling    bilat LE   Urticaria    Past Surgical History:  Procedure Laterality Date   Gum Grafting  11/2015, 06/2017   KNEE ARTHROSCOPY Right 2001    WISDOM TOOTH EXTRACTION Bilateral 1997     Current Outpatient Medications  Medication Sig Dispense Refill   diltiazem (CARDIZEM CD) 240 MG 24 hr capsule Take 1 capsule (240 mg total) by mouth daily. Please keep upcoming appt in June 2022 with Dr. Elberta [name] before anymore refills. Thank you 30 capsule 2   diltiazem (CARDIZEM) 30 MG tablet Take 1 tablet (30 mg total) by mouth as needed (SVT EPISODES). Please keep appt in August 2022 with Dr. Elberta [name] for refills. 30 tablet 1   hydrOXYzine (ATARAX/VISTARIL) 50 MG tablet TAKE 1 TO 2 TABLETS BY MOUTH EVERY 6 HOURS AS NEEDED FOR ITCHIN  1   levothyroxine (SYNTHROID) 100 MCG tablet Take 1 tablet by mouth daily in the morning on an empty stomach 90 tablet 1   loratadine (CLARITIN) 10 MG tablet Take 10 mg by mouth daily.     Norgestimate-Ethinyl Estradiol Triphasic 0.18/0.215/0.25 MG-35 MCG tablet TAKE 1 TABLET BY MOUTH ONCE DAILY 84 tablet 2   Pitavastatin Calcium 2 MG TABS TAKE 1 TABLET BY MOUTH ONCE A DAY 90 tablet 1   No current facility-administered medications for this visit.    Allergies:   Atorvastatin, Crestor [rosuvastatin calcium], Latex, Neosporin [neomycin-bacitracin zn-polymyx], and Toradol [ketorolac tromethamine]   Social History:  The patient  reports that she has never smoked. She has never used smokeless tobacco. She reports current  alcohol use. She reports that she does not use drugs.   Family History:  The patient's family history includes Eczema in her father; High Cholesterol in her mother; High blood pressure in her mother; Sleep apnea in her father; Thyroid disease in her mother.   ROS:  Please see the history of present illness.   Otherwise, review of systems is positive for none.   All other systems are reviewed and negative.   PHYSICAL EXAM: VS:  BP 122/76   Pulse 87   Ht 5\' 4"  (1.626 m)   Wt 247 lb (112 kg)   BMI 42.40 kg/m  , BMI Body mass index is 42.4 kg/m. GEN: Well nourished, well developed, in no acute distress   HEENT: normal  Neck: no JVD, carotid bruits, or masses Cardiac: RRR; no murmurs, rubs, or gallops,no edema  Respiratory:  clear to auscultation bilaterally, normal work of breathing GI: soft, nontender, nondistended, + BS MS: no deformity or atrophy  Skin: warm and dry Neuro:  Strength and sensation are intact Psych: euthymic mood, full affect  EKG:  EKG is ordered today. Personal review of the ekg ordered shows sinus rhythm, rate 87   Recent Labs: No results found for requested labs within last 8760 hours.    Lipid Panel     Component Value Date/Time   CHOL 218 (H) 04/13/2019 0000   TRIG 122 04/13/2019 0000   HDL 73 04/13/2019 0000   CHOLHDL 3.0 04/13/2019 0000   LDLCALC 121 (H) 04/13/2019 0000     Wt Readings from Last 3 Encounters:  06/09/21 247 lb (112 kg)  01/04/20 232 lb (105.2 kg)  05/15/19 240 lb (108.9 kg)      Other studies Reviewed: Additional studies/ records that were reviewed today include: TTE 12/22/16, 30 day monitor 01/12/17  Review of the above records today demonstrates:  - Left ventricle: The cavity size was normal. Wall thickness was   normal. Systolic function was normal. The estimated ejection   fraction was in the range of 55% to 60%. Wall motion was normal;   there were no regional wall motion abnormalities. Left   ventricular diastolic function parameters were normal. - Mitral valve: There was mild regurgitation. - Left atrium: The atrium was mildly dilated.    Normal sinus rhythm with average heart rate 87bpm. The heart rate ranged from 61 to 175bpm. SVT - likely sinus tachycardia up to 175bpm Rare PAC and PVC   ASSESSMENT AND PLAN:  1.  SVT: Currently on diltiazem with good control of her SVT.  She has 30 mg tabs but is rarely taken any doses.  She has had more frequent episodes of SVT.  Due to that, we Madison [name] increase diltiazem to 300 mg.  If this does not control her symptoms, metoprolol may be a second option.  Current medicines are  reviewed at length with the patient today.   The patient does not have concerns regarding her medicines.  The following changes were made today: None  Labs/ tests ordered today include:  Orders Placed This Encounter  Procedures   EKG 12-Lead      Disposition:   FU with Coline Calkin 12 months  Signed, Nancye Grumbine 03/14/17, MD  06/09/2021 4:07 PM     [facility] [address] [place] [phone] (office) [phone] (fax)

## 2021-06-10 ENCOUNTER — Other Ambulatory Visit (HOSPITAL_COMMUNITY): Payer: Self-pay

## 2021-06-11 ENCOUNTER — Other Ambulatory Visit (HOSPITAL_COMMUNITY): Payer: Self-pay

## 2021-06-26 ENCOUNTER — Other Ambulatory Visit (HOSPITAL_BASED_OUTPATIENT_CLINIC_OR_DEPARTMENT_OTHER): Payer: Self-pay

## 2021-06-26 ENCOUNTER — Other Ambulatory Visit (HOSPITAL_COMMUNITY): Payer: Self-pay

## 2021-06-26 MED ORDER — TRIAMCINOLONE ACETONIDE 0.5 % EX CREA
TOPICAL_CREAM | CUTANEOUS | 1 refills | Status: DC
  Filled 2021-06-26: qty 45, 90d supply, fill #0

## 2021-06-26 MED ORDER — HYDROXYZINE HCL 50 MG PO TABS
ORAL_TABLET | ORAL | 0 refills | Status: DC
  Filled 2021-06-26: qty 90, 11d supply, fill #0

## 2021-06-26 MED FILL — Norgestimate-Eth Estrad Tab 0.18-35/0.215-35/0.25-35 MG-MCG: ORAL | 84 days supply | Qty: 84 | Fill #1 | Status: AC

## 2021-06-27 ENCOUNTER — Other Ambulatory Visit (HOSPITAL_COMMUNITY): Payer: Self-pay

## 2021-06-28 ENCOUNTER — Other Ambulatory Visit (HOSPITAL_COMMUNITY): Payer: Self-pay

## 2021-07-12 ENCOUNTER — Telehealth: Payer: No Typology Code available for payment source | Admitting: Physician Assistant

## 2021-07-12 DIAGNOSIS — L03119 Cellulitis of unspecified part of limb: Secondary | ICD-10-CM

## 2021-07-12 MED ORDER — CEPHALEXIN 500 MG PO CAPS: 500.0000 mg | ORAL_CAPSULE | Freq: Four times a day (QID) | ORAL | 0 refills | Status: DC

## 2021-07-12 NOTE — Progress Notes (Signed)
E Visit for Cellulitis  We are sorry that you are not feeling well. Here is how we plan to help!  Based on what you shared with me it looks like you have cellulitis.  Cellulitis looks like areas of skin redness, swelling, and warmth; it develops as a result of bacteria entering under the skin. Little red spots and/or bleeding can be seen in skin, and tiny surface sacs containing fluid can occur. Fever can be present. Cellulitis is almost always on one side of a body, and the lower limbs are the most common site of involvement.   I have prescribed:  Keflex 500mg take one by mouth four times a day for 5 days  HOME CARE:  Take your medications as ordered and take all of them, even if the skin irritation appears to be healing.   GET HELP RIGHT AWAY IF:  Symptoms that don't begin to go away within 48 hours. Severe redness persists or worsens If the area turns color, spreads or swells. If it blisters and opens, develops yellow-brown crust or bleeds. You develop a fever or chills. If the pain increases or becomes unbearable.  Are unable to keep fluids and food down.  MAKE SURE YOU   Understand these instructions. Will watch your condition. Will get help right away if you are not doing well or get worse.  Thank you for choosing an e-visit.  Your e-visit answers were reviewed by a board certified advanced clinical practitioner to complete your personal care plan. Depending upon the condition, your plan could have included both over the counter or prescription medications.  Please review your pharmacy choice. Make sure the pharmacy is open so you can pick up prescription now. If there is a problem, you may contact your provider through MyChart messaging and have the prescription routed to another pharmacy.  Your safety is important to us. If you have drug allergies check your prescription carefully.   For the next 24 hours you can use MyChart to ask questions about today's visit, request a  non-urgent call back, or ask for a work or school excuse. You will get an email in the next two days asking about your experience. I hope that your e-visit has been valuable and will speed your recovery.  I provided 6 minutes of non face-to-face time during this encounter for chart review and documentation.   

## 2021-07-25 ENCOUNTER — Other Ambulatory Visit (HOSPITAL_COMMUNITY): Payer: Self-pay

## 2021-07-25 MED ORDER — BELSOMRA 20 MG PO TABS
1.0000 | ORAL_TABLET | Freq: Every evening | ORAL | 3 refills | Status: DC | PRN
  Filled 2021-07-25: qty 30, 30d supply, fill #0

## 2021-07-26 ENCOUNTER — Other Ambulatory Visit (HOSPITAL_COMMUNITY): Payer: Self-pay

## 2021-07-26 MED ORDER — BELSOMRA 20 MG PO TABS
20.0000 mg | ORAL_TABLET | ORAL | 3 refills | Status: DC
  Filled 2021-07-26: qty 30, 30d supply, fill #0
  Filled 2021-09-08: qty 30, 30d supply, fill #1
  Filled 2021-10-17: qty 30, 30d supply, fill #2
  Filled 2021-11-30: qty 30, 30d supply, fill #3

## 2021-07-28 ENCOUNTER — Other Ambulatory Visit (HOSPITAL_COMMUNITY): Payer: Self-pay

## 2021-08-28 ENCOUNTER — Other Ambulatory Visit: Payer: Self-pay | Admitting: Physician Assistant

## 2021-08-28 ENCOUNTER — Other Ambulatory Visit (HOSPITAL_COMMUNITY): Payer: Self-pay

## 2021-08-28 DIAGNOSIS — Z1231 Encounter for screening mammogram for malignant neoplasm of breast: Secondary | ICD-10-CM

## 2021-08-28 MED ORDER — EPINEPHRINE 0.3 MG/0.3ML IJ SOAJ
INTRAMUSCULAR | 0 refills | Status: DC
  Filled 2021-08-28: qty 2, 30d supply, fill #0

## 2021-09-02 ENCOUNTER — Other Ambulatory Visit: Payer: Self-pay

## 2021-09-02 ENCOUNTER — Other Ambulatory Visit (HOSPITAL_COMMUNITY): Payer: Self-pay

## 2021-09-02 ENCOUNTER — Ambulatory Visit
Admission: RE | Admit: 2021-09-02 | Discharge: 2021-09-02 | Disposition: A | Discharge status: Home / Self Care | Payer: No Typology Code available for payment source | Source: Ambulatory Visit | Attending: Physician Assistant | Admitting: Physician Assistant

## 2021-09-02 DIAGNOSIS — Z1231 Encounter for screening mammogram for malignant neoplasm of breast: Secondary | ICD-10-CM

## 2021-09-02 LAB — HM PAP SMEAR

## 2021-09-04 ENCOUNTER — Other Ambulatory Visit (HOSPITAL_COMMUNITY): Payer: Self-pay

## 2021-09-04 MED ORDER — LEVOTHYROXINE SODIUM 112 MCG PO TABS
ORAL_TABLET | ORAL | 2 refills | Status: DC
  Filled 2021-09-04: qty 30, 30d supply, fill #0
  Filled 2021-10-01: qty 30, 30d supply, fill #1
  Filled 2021-11-30: qty 30, 30d supply, fill #2

## 2021-09-08 ENCOUNTER — Other Ambulatory Visit (HOSPITAL_COMMUNITY): Payer: Self-pay

## 2021-09-08 MED ORDER — HYDROXYZINE HCL 50 MG PO TABS
ORAL_TABLET | ORAL | 0 refills | Status: DC
  Filled 2021-09-08: qty 90, 12d supply, fill #0

## 2021-09-08 MED ORDER — NORGESTIM-ETH ESTRAD TRIPHASIC 0.18/0.215/0.25 MG-35 MCG PO TABS
ORAL_TABLET | ORAL | 2 refills | Status: DC
  Filled 2021-09-08: qty 84, 84d supply, fill #0
  Filled 2021-11-30: qty 84, 84d supply, fill #1
  Filled 2022-03-08: qty 84, 84d supply, fill #2

## 2021-09-09 ENCOUNTER — Other Ambulatory Visit (HOSPITAL_COMMUNITY): Payer: Self-pay

## 2021-10-01 ENCOUNTER — Other Ambulatory Visit (HOSPITAL_COMMUNITY): Payer: Self-pay

## 2021-10-17 ENCOUNTER — Other Ambulatory Visit (HOSPITAL_COMMUNITY): Payer: Self-pay

## 2021-10-17 MED ORDER — HYDROXYZINE HCL 50 MG PO TABS
ORAL_TABLET | ORAL | 0 refills | Status: DC
  Filled 2021-10-17: qty 90, 11d supply, fill #0

## 2021-11-06 ENCOUNTER — Other Ambulatory Visit (HOSPITAL_COMMUNITY): Payer: Self-pay

## 2021-11-06 MED ORDER — LEVOTHYROXINE SODIUM 112 MCG PO TABS
ORAL_TABLET | ORAL | 2 refills | Status: DC
  Filled 2021-11-06: qty 30, 30d supply, fill #0
  Filled 2021-12-24: qty 30, 30d supply, fill #1
  Filled 2022-01-23: qty 30, 30d supply, fill #2

## 2021-11-30 ENCOUNTER — Other Ambulatory Visit (HOSPITAL_COMMUNITY): Payer: Self-pay

## 2021-12-01 ENCOUNTER — Other Ambulatory Visit (HOSPITAL_COMMUNITY): Payer: Self-pay

## 2021-12-01 MED ORDER — HYDROXYZINE HCL 50 MG PO TABS
ORAL_TABLET | ORAL | 2 refills | Status: DC
  Filled 2021-12-01: qty 90, 11d supply, fill #0
  Filled 2022-01-23: qty 90, 11d supply, fill #1
  Filled 2022-03-05: qty 90, 11d supply, fill #2

## 2021-12-02 ENCOUNTER — Other Ambulatory Visit (HOSPITAL_COMMUNITY): Payer: Self-pay

## 2021-12-24 ENCOUNTER — Other Ambulatory Visit (HOSPITAL_COMMUNITY): Payer: Self-pay

## 2021-12-26 ENCOUNTER — Other Ambulatory Visit (HOSPITAL_COMMUNITY): Payer: Self-pay

## 2021-12-29 ENCOUNTER — Other Ambulatory Visit (HOSPITAL_COMMUNITY): Payer: Self-pay

## 2022-01-01 ENCOUNTER — Other Ambulatory Visit (HOSPITAL_COMMUNITY): Payer: Self-pay

## 2022-01-01 MED ORDER — BELSOMRA 20 MG PO TABS
ORAL_TABLET | ORAL | 0 refills | Status: DC
  Filled 2022-01-01 – 2022-01-23 (×2): qty 30, 30d supply, fill #0

## 2022-01-02 ENCOUNTER — Other Ambulatory Visit (HOSPITAL_COMMUNITY): Payer: Self-pay

## 2022-01-12 ENCOUNTER — Other Ambulatory Visit (HOSPITAL_COMMUNITY): Payer: Self-pay

## 2022-01-23 ENCOUNTER — Other Ambulatory Visit (HOSPITAL_COMMUNITY): Payer: Self-pay

## 2022-03-05 ENCOUNTER — Other Ambulatory Visit (HOSPITAL_COMMUNITY): Payer: Self-pay

## 2022-03-05 MED ORDER — LEVOTHYROXINE SODIUM 112 MCG PO TABS
112.0000 ug | ORAL_TABLET | Freq: Every day | ORAL | 6 refills | Status: DC
  Filled 2022-03-05: qty 30, 30d supply, fill #0
  Filled 2022-04-02: qty 30, 30d supply, fill #1
  Filled 2022-05-03: qty 30, 30d supply, fill #2
  Filled 2022-06-05: qty 30, 30d supply, fill #3

## 2022-03-09 ENCOUNTER — Other Ambulatory Visit (HOSPITAL_COMMUNITY): Payer: Self-pay

## 2022-03-28 ENCOUNTER — Other Ambulatory Visit (HOSPITAL_COMMUNITY): Payer: Self-pay

## 2022-04-02 ENCOUNTER — Other Ambulatory Visit (HOSPITAL_COMMUNITY): Payer: Self-pay

## 2022-05-03 ENCOUNTER — Other Ambulatory Visit (HOSPITAL_COMMUNITY): Payer: Self-pay

## 2022-05-03 ENCOUNTER — Telehealth: Payer: No Typology Code available for payment source | Admitting: Family

## 2022-05-03 DIAGNOSIS — L309 Dermatitis, unspecified: Secondary | ICD-10-CM

## 2022-05-03 MED ORDER — PREDNISONE 10 MG (21) PO TBPK
ORAL_TABLET | ORAL | 0 refills | Status: DC
  Filled 2022-05-03: qty 21, 6d supply, fill #0

## 2022-05-03 MED ORDER — TRIAMCINOLONE ACETONIDE 0.5 % EX OINT
1.0000 | TOPICAL_OINTMENT | Freq: Two times a day (BID) | CUTANEOUS | 0 refills | Status: DC
  Filled 2022-05-03: qty 105, 30d supply, fill #0

## 2022-05-03 NOTE — Progress Notes (Signed)

## 2022-05-04 ENCOUNTER — Other Ambulatory Visit (HOSPITAL_COMMUNITY): Payer: Self-pay

## 2022-05-04 MED ORDER — HYDROXYZINE HCL 50 MG PO TABS
ORAL_TABLET | ORAL | 1 refills | Status: DC
  Filled 2022-05-04: qty 90, 11d supply, fill #0

## 2022-05-04 MED ORDER — NORGESTIM-ETH ESTRAD TRIPHASIC 0.18/0.215/0.25 MG-35 MCG PO TABS
1.0000 | ORAL_TABLET | Freq: Every day | ORAL | 1 refills | Status: DC
  Filled 2022-05-04: qty 84, 84d supply, fill #0
  Filled 2022-08-04: qty 84, 84d supply, fill #1

## 2022-05-11 ENCOUNTER — Other Ambulatory Visit (HOSPITAL_COMMUNITY): Payer: Self-pay

## 2022-05-14 ENCOUNTER — Other Ambulatory Visit (HOSPITAL_COMMUNITY): Payer: Self-pay

## 2022-05-16 ENCOUNTER — Other Ambulatory Visit (HOSPITAL_COMMUNITY): Payer: Self-pay

## 2022-06-06 ENCOUNTER — Other Ambulatory Visit (HOSPITAL_COMMUNITY): Payer: Self-pay

## 2022-06-24 ENCOUNTER — Other Ambulatory Visit: Payer: Self-pay | Admitting: Cardiology

## 2022-06-25 ENCOUNTER — Other Ambulatory Visit (HOSPITAL_COMMUNITY): Payer: Self-pay

## 2022-06-25 ENCOUNTER — Other Ambulatory Visit: Payer: Self-pay

## 2022-06-25 DIAGNOSIS — L309 Dermatitis, unspecified: Secondary | ICD-10-CM | POA: Insufficient documentation

## 2022-06-25 DIAGNOSIS — J309 Allergic rhinitis, unspecified: Secondary | ICD-10-CM | POA: Insufficient documentation

## 2022-06-25 DIAGNOSIS — F430 Acute stress reaction: Secondary | ICD-10-CM | POA: Insufficient documentation

## 2022-06-25 DIAGNOSIS — G479 Sleep disorder, unspecified: Secondary | ICD-10-CM | POA: Insufficient documentation

## 2022-06-25 MED ORDER — DILTIAZEM HCL ER COATED BEADS 300 MG PO CP24
300.0000 mg | ORAL_CAPSULE | Freq: Every day | ORAL | 0 refills | Status: DC
  Filled 2022-06-25: qty 30, 30d supply, fill #0

## 2022-06-25 NOTE — Progress Notes (Signed)
New Patient Office Visit  Subjective    Patient ID: Madison [name], female    DOB: 08/13/1981  Age: 41 y.o. MRN: [id number]  CC:  Chief Complaint  Patient presents with   Establish Care    HPI Madison [name] presents to establish care.  Extensive past medical history that is significant for SVT, allergic rhinitis, hypothyroidism, obesity and hyperlipidemia.  Anxiety and depression - related to work and is short staffed and is overwhelmed  - she doesn't get panic attacks   Weight  She just rejoined [employer] and is working on diet and exercise.   Outpatient Encounter Medications as of 06/26/2022  Medication Sig   busPIRone (BUSPAR) 15 MG tablet Take 1 tablet (15 mg total) by mouth 2 (two) times daily.   diltiazem (CARDIZEM CD) 300 MG 24 hr capsule Take 1 capsule (300 mg total) by mouth daily. Please call to schedule an overdue appointment with Dr. Curt Bears for refills, [phone], thank you. 1st attempt.   diltiazem (CARDIZEM) 30 MG tablet Take 1 tablet (30 mg total) by mouth as needed (SVT EPISODES). Please keep appt in August 2022 with Dr. Curt Bears for refills.   EPINEPHrine 0.3 mg/0.3 mL IJ SOAJ injection Inject as directed in emergency situation for severe allergic reaction   escitalopram (LEXAPRO) 10 MG tablet Take 1 tablet (10 mg total) by mouth daily.   hydrOXYzine (ATARAX) 50 MG tablet TAKE 1 TO 2 TABLETS BY MOUTH EVERY 6 HOURS AS NEEDED FOR ITCHING (need office visit for refills)   levothyroxine (SYNTHROID) 112 MCG tablet Take 1 tablet by mouth once daily in the morning on an empty stomach   loratadine (CLARITIN) 10 MG tablet Take 10 mg by mouth daily.   Norgestimate-Ethinyl Estradiol Triphasic (TRI-VYLIBRA) 0.18/0.215/0.25 MG-35 MCG tablet Take 1 tablet by mouth daily   Suvorexant (BELSOMRA) 20 MG TABS Take 1 tablet by mouth once a day at bedtime as needed.   Pitavastatin Calcium 2 MG TABS TAKE 1 TABLET BY MOUTH ONCE A DAY   No facility-administered encounter  medications on file as of 06/26/2022.    Past Medical History:  Diagnosis Date   Allergy    Anxiety    Asthma    Back pain    Constipation    Eczema    Exercise-induced bronchospasm 06/12/2016   GERD (gastroesophageal reflux disease)    Herpes simplex disease    Hyperlipidemia    Hypothyroidism    Joint pain    Palpitation    SOB (shortness of breath)    SVT (supraventricular tachycardia) ([facility]) 11/30/2016   Swelling    bilat LE   Urticaria     Past Surgical History:  Procedure Laterality Date   Gum Grafting  11/2015, 06/2017   KNEE ARTHROSCOPY Right 2001   WISDOM TOOTH EXTRACTION Bilateral 1997    Family History  Problem Relation Age of Onset   Eczema Father    Sleep apnea Father    High blood pressure Mother    High Cholesterol Mother    Thyroid disease Mother    Allergic rhinitis Neg Hx    Angioedema Neg Hx    Asthma Neg Hx    Atopy Neg Hx    Immunodeficiency Neg Hx    Urticaria Neg Hx     Social History   Socioeconomic History   Marital status: Significant Other    Spouse name: Not on file   Number of children: Not on file   Years of education: Not on file  Highest education level: Not on file  Occupational History   Occupation: CMA  Tobacco Use   Smoking status: Never   Smokeless tobacco: Never  Vaping Use   Vaping Use: Never used  Substance and Sexual Activity   Alcohol use: Yes   Drug use: No   Sexual activity: Not on file  Other Topics Concern   Not on file  Social History Narrative   Lives in apartment with carpeting.  No pets.  Electric heat and central cooling.     Social Determinants of Health   Financial Resource Strain: Not on file  Food Insecurity: Not on file  Transportation Needs: Not on file  Physical Activity: Not on file  Stress: Not on file  Social Connections: Not on file  Intimate Partner Violence: Not on file    Review of Systems  Constitutional:  Negative for chills and fever.  Respiratory:  Negative for cough and  shortness of breath.   Cardiovascular:  Negative for chest pain.  Neurological:  Negative for headaches.        Objective    BP 128/85   Pulse 87   Ht 5\' 4"  (1.626 m)   Wt 216 lb (98 kg)   SpO2 99%   BMI 37.08 kg/m   Physical Exam Vitals and nursing note reviewed.  Constitutional:      General: She is not in acute distress.    Appearance: Normal appearance.  HENT:     Head: Normocephalic and atraumatic.     Right Ear: External ear normal.     Left Ear: External ear normal.     Nose: Nose normal.  Eyes:     Conjunctiva/sclera: Conjunctivae normal.  Cardiovascular:     Rate and Rhythm: Normal rate and regular rhythm.  Pulmonary:     Effort: Pulmonary effort is normal.     Breath sounds: Normal breath sounds.  Neurological:     General: No focal deficit present.     Mental Status: She is alert and oriented to person, place, and time.  Psychiatric:        Mood and Affect: Mood normal.        Behavior: Behavior normal.        Thought Content: Thought content normal.        Judgment: Judgment normal.      Assessment & Plan:   Problem List Items Addressed This Visit       Endocrine   Hypothyroidism - Primary    - Repeat TSH level.  If no change will continue her current medication      Relevant Orders   TSH + free T4     Other   Hyperlipidemia   Relevant Orders   Lipid panel   Generalized anxiety disorder    - Patient on Lexapro 10 mg daily.  Counseled on side effects as well as when to take medication.  We will also give prescription of BuSpar 15 mg and discussed how patient can break this and take 5 mg or 7.5 mg as needed.  Give a 10-day prescription for this to see how often she is needing this. -Have also discussed different methods to decrease her feeling overwhelmed at work such as the role of fives for anxiety and deep breathing as well as listening to music and stepping away from her desk for a few moments. - gad7: 12 -Patient will follow-up in 4  weeks to assess efficacy and need for titration.      Relevant  Medications   escitalopram (LEXAPRO) 10 MG tablet   busPIRone (BUSPAR) 15 MG tablet   Other Relevant Orders   TSH + free T4   Depression, major, single episode, moderate (HCC)    - Counseled on depressive symptoms PHQ-9 elevated to 10.  Denies suicidal and homicidal ideation we will start Lexapro 10 mg -Offered therapy however patient is really busy at this time and would like to start medication management first.      Relevant Medications   escitalopram (LEXAPRO) 10 MG tablet   busPIRone (BUSPAR) 15 MG tablet   Obesity (BMI 35.0-39.9 without comorbidity)    - Counseled on diet and exercise - Patient will do 3 months of diet and exercise control and we will watch her weight.  If weight has not improved and she has been doing appropriate diet and exercise management can consider starting patient on Rybelsus or Wegovy.       Return in about 4 weeks (around 07/24/2022) for anxiety and depression follow up.   Charlton Amor, DO

## 2022-06-26 ENCOUNTER — Encounter: Payer: Self-pay | Admitting: Family Medicine

## 2022-06-26 ENCOUNTER — Other Ambulatory Visit (HOSPITAL_COMMUNITY): Payer: Self-pay

## 2022-06-26 ENCOUNTER — Ambulatory Visit: Payer: Commercial Managed Care - PPO | Admitting: Family Medicine

## 2022-06-26 VITALS — BP 128/85 | HR 87 | Ht 64.0 in | Wt 216.0 lb

## 2022-06-26 DIAGNOSIS — F321 Major depressive disorder, single episode, moderate: Secondary | ICD-10-CM | POA: Diagnosis not present

## 2022-06-26 DIAGNOSIS — E785 Hyperlipidemia, unspecified: Secondary | ICD-10-CM | POA: Diagnosis not present

## 2022-06-26 DIAGNOSIS — E039 Hypothyroidism, unspecified: Secondary | ICD-10-CM

## 2022-06-26 DIAGNOSIS — F411 Generalized anxiety disorder: Secondary | ICD-10-CM | POA: Diagnosis not present

## 2022-06-26 DIAGNOSIS — E669 Obesity, unspecified: Secondary | ICD-10-CM | POA: Insufficient documentation

## 2022-06-26 MED ORDER — ESCITALOPRAM OXALATE 10 MG PO TABS
10.0000 mg | ORAL_TABLET | Freq: Every day | ORAL | 1 refills | Status: DC
  Filled 2022-06-26: qty 30, 30d supply, fill #0

## 2022-06-26 MED ORDER — BUSPIRONE HCL 15 MG PO TABS
15.0000 mg | ORAL_TABLET | Freq: Two times a day (BID) | ORAL | 0 refills | Status: DC
  Filled 2022-06-26: qty 10, 5d supply, fill #0

## 2022-06-26 NOTE — Assessment & Plan Note (Signed)
-   Repeat TSH level.  If no change will continue her current medication

## 2022-06-26 NOTE — Patient Instructions (Signed)
Anxiety rule of 5s

## 2022-06-26 NOTE — Assessment & Plan Note (Signed)
-   Counseled on depressive symptoms PHQ-9 elevated to 10.  Denies suicidal and homicidal ideation we will start Lexapro 10 mg -Offered therapy however patient is really busy at this time and would like to start medication management first.

## 2022-06-26 NOTE — Assessment & Plan Note (Addendum)
-   Patient on Lexapro 10 mg daily.  Counseled on side effects as well as when to take medication.  We will also give prescription of BuSpar 15 mg and discussed how patient can break this and take 5 mg or 7.5 mg as needed.  Give a 10-day prescription for this to see how often she is needing this. -Have also discussed different methods to decrease her feeling overwhelmed at work such as the role of fives for anxiety and deep breathing as well as listening to music and stepping away from her desk for a few moments. - gad7: 12 -Patient will follow-up in 4 weeks to assess efficacy and need for titration.

## 2022-06-26 NOTE — Assessment & Plan Note (Signed)
-   Counseled on diet and exercise - Patient will do 3 months of diet and exercise control and we will watch her weight.  If weight has not improved and she has been doing appropriate diet and exercise management can consider starting patient on Rybelsus or Wegovy.

## 2022-06-27 LAB — LIPID PANEL
Cholesterol: 266 mg/dL — ABNORMAL HIGH (ref <200)
HDL: 75 mg/dL (ref >=50)
LDL Cholesterol (Calc): 167 mg/dL (calc) — ABNORMAL HIGH
Non-HDL Cholesterol (Calc): 191 mg/dL (calc) — ABNORMAL HIGH (ref <130)
Total CHOL/HDL Ratio: 3.5 (calc) (ref <5.0)
Triglycerides: 116 mg/dL (ref <150)

## 2022-06-27 LAB — TSH+FREE T4: TSH W/REFLEX TO FT4: 1.61 mIU/L

## 2022-06-28 ENCOUNTER — Other Ambulatory Visit: Payer: Self-pay | Admitting: Family Medicine

## 2022-06-28 DIAGNOSIS — E039 Hypothyroidism, unspecified: Secondary | ICD-10-CM

## 2022-06-28 MED ORDER — LEVOTHYROXINE SODIUM 112 MCG PO TABS
112.0000 ug | ORAL_TABLET | Freq: Every day | ORAL | 3 refills | Status: DC
  Filled 2022-06-28: qty 90, 90d supply, fill #0
  Filled 2022-09-30: qty 90, 90d supply, fill #1
  Filled 2022-12-29: qty 90, 90d supply, fill #2
  Filled 2023-04-01: qty 90, 90d supply, fill #3

## 2022-06-29 ENCOUNTER — Other Ambulatory Visit (HOSPITAL_COMMUNITY): Payer: Self-pay

## 2022-07-08 ENCOUNTER — Other Ambulatory Visit (HOSPITAL_COMMUNITY): Payer: Self-pay

## 2022-07-08 ENCOUNTER — Encounter: Payer: Self-pay | Admitting: Family Medicine

## 2022-07-08 MED ORDER — BELSOMRA 20 MG PO TABS
ORAL_TABLET | ORAL | 0 refills | Status: DC
  Filled 2022-07-08: qty 30, 30d supply, fill #0

## 2022-07-09 ENCOUNTER — Other Ambulatory Visit (HOSPITAL_COMMUNITY): Payer: Self-pay

## 2022-07-16 ENCOUNTER — Other Ambulatory Visit (HOSPITAL_COMMUNITY): Payer: Self-pay

## 2022-07-21 ENCOUNTER — Encounter: Payer: Self-pay | Admitting: Cardiology

## 2022-07-21 NOTE — Progress Notes (Unsigned)
   Established Patient Office Visit  Subjective   Patient ID: Madison [name], female    DOB: 1981-08-21  Age: 41 y.o. MRN: [id number]  No chief complaint on file.   HPI Pt is here to follow up for anxiety and depression.   She was started on lexapro 10mg .   ROS    Objective:     There were no vitals taken for this visit. {Vitals History (Optional):23777}  Physical Exam   No results found for any visits on 07/22/22.  {Labs (Optional):23779}  The 10-year ASCVD risk score (Arnett DK, et al., 2019) is: 0.6%    Assessment & Plan:   Problem List Items Addressed This Visit   None   No follow-ups on file.    2020, DO

## 2022-07-22 ENCOUNTER — Encounter: Payer: Self-pay | Admitting: Family Medicine

## 2022-07-22 ENCOUNTER — Other Ambulatory Visit (HOSPITAL_COMMUNITY): Payer: Self-pay

## 2022-07-22 ENCOUNTER — Ambulatory Visit: Payer: Commercial Managed Care - PPO | Admitting: Family Medicine

## 2022-07-22 VITALS — BP 114/76 | HR 78 | Wt 221.0 lb

## 2022-07-22 DIAGNOSIS — F411 Generalized anxiety disorder: Secondary | ICD-10-CM

## 2022-07-22 DIAGNOSIS — E669 Obesity, unspecified: Secondary | ICD-10-CM | POA: Diagnosis not present

## 2022-07-22 DIAGNOSIS — F321 Major depressive disorder, single episode, moderate: Secondary | ICD-10-CM

## 2022-07-22 MED ORDER — RYBELSUS 3 MG PO TABS: 3.0000 mg | ORAL_TABLET | Freq: Every day | ORAL | 0 refills | Status: DC

## 2022-07-22 MED ORDER — ESCITALOPRAM OXALATE 10 MG PO TABS
10.0000 mg | ORAL_TABLET | Freq: Every day | ORAL | 3 refills | Status: DC
  Filled 2022-07-22: qty 90, 90d supply, fill #0
  Filled 2022-10-19: qty 90, 90d supply, fill #1
  Filled 2023-01-24: qty 90, 90d supply, fill #2
  Filled 2023-04-22: qty 90, 90d supply, fill #3

## 2022-07-22 MED ORDER — RYBELSUS 3 MG PO TABS
1.0000 | ORAL_TABLET | Freq: Every day | ORAL | 2 refills | Status: AC
  Filled 2022-07-22: qty 30, 30d supply, fill #0

## 2022-07-22 NOTE — Assessment & Plan Note (Signed)
-   continue lexapro - denies suicidal/homicidal ideation

## 2022-07-22 NOTE — Assessment & Plan Note (Addendum)
-   pt unable to lose weight and actually gained weight since last visit  - due to elevated BMI of 37 I feel it is time for patient to start weight loss medication to prevent future comorbidities  - will go ahead and start rybelsus 3mg  for 30 days with the goal of increasing to 7mg  in one month if tolerated appropriately - pt provided sample of 3mg  in clinic today  - discussed side effects

## 2022-07-22 NOTE — Assessment & Plan Note (Signed)
-   continue lexapro 10mg  have sent in a new 90 day supply to pharmacy  - has not had panic attacks since starting lexapro

## 2022-07-23 ENCOUNTER — Other Ambulatory Visit: Payer: Self-pay | Admitting: Cardiology

## 2022-07-23 ENCOUNTER — Other Ambulatory Visit (HOSPITAL_COMMUNITY): Payer: Self-pay

## 2022-07-23 MED ORDER — DILTIAZEM HCL ER COATED BEADS 300 MG PO CP24
300.0000 mg | ORAL_CAPSULE | Freq: Every day | ORAL | 0 refills | Status: DC
  Filled 2022-07-23: qty 15, 15d supply, fill #0

## 2022-07-28 ENCOUNTER — Other Ambulatory Visit (HOSPITAL_COMMUNITY): Payer: Self-pay

## 2022-07-28 ENCOUNTER — Other Ambulatory Visit: Payer: Self-pay | Admitting: Family Medicine

## 2022-07-29 ENCOUNTER — Other Ambulatory Visit: Payer: Self-pay | Admitting: Family Medicine

## 2022-07-29 ENCOUNTER — Encounter: Payer: Self-pay | Admitting: Family Medicine

## 2022-07-29 ENCOUNTER — Other Ambulatory Visit (HOSPITAL_COMMUNITY): Payer: Self-pay

## 2022-07-30 ENCOUNTER — Other Ambulatory Visit (HOSPITAL_COMMUNITY): Payer: Self-pay

## 2022-08-05 ENCOUNTER — Other Ambulatory Visit (HOSPITAL_COMMUNITY): Payer: Self-pay

## 2022-08-05 LAB — HEMOGLOBIN A1C
Hgb A1c MFr Bld: 5.4 % of total Hgb (ref <5.7)
Mean Plasma Glucose: 108 mg/dL
eAG (mmol/L): 6 mmol/L

## 2022-08-10 ENCOUNTER — Other Ambulatory Visit: Payer: Self-pay | Admitting: Cardiology

## 2022-08-12 ENCOUNTER — Other Ambulatory Visit (HOSPITAL_COMMUNITY): Payer: Self-pay

## 2022-08-12 ENCOUNTER — Other Ambulatory Visit: Payer: Self-pay | Admitting: Family Medicine

## 2022-08-12 ENCOUNTER — Other Ambulatory Visit: Payer: Self-pay | Admitting: Cardiology

## 2022-08-12 DIAGNOSIS — Z1231 Encounter for screening mammogram for malignant neoplasm of breast: Secondary | ICD-10-CM

## 2022-08-12 MED ORDER — DILTIAZEM HCL ER COATED BEADS 300 MG PO CP24
300.0000 mg | ORAL_CAPSULE | Freq: Every day | ORAL | 1 refills | Status: DC
  Filled 2022-08-12: qty 30, 30d supply, fill #0
  Filled 2022-09-07: qty 30, 30d supply, fill #1

## 2022-08-12 NOTE — Telephone Encounter (Signed)
Pt's medication was sent to pt's pharmacy as requested. Confirmation received.  °

## 2022-08-21 ENCOUNTER — Encounter: Payer: Self-pay | Admitting: Family Medicine

## 2022-08-21 ENCOUNTER — Telehealth: Payer: Self-pay

## 2022-08-21 ENCOUNTER — Other Ambulatory Visit (HOSPITAL_COMMUNITY): Payer: Self-pay

## 2022-08-21 ENCOUNTER — Ambulatory Visit (INDEPENDENT_AMBULATORY_CARE_PROVIDER_SITE_OTHER): Payer: 59 | Admitting: Family Medicine

## 2022-08-21 ENCOUNTER — Other Ambulatory Visit: Payer: Self-pay | Admitting: Family Medicine

## 2022-08-21 VITALS — BP 121/82 | HR 82 | Ht 64.0 in | Wt 220.0 lb

## 2022-08-21 DIAGNOSIS — E669 Obesity, unspecified: Secondary | ICD-10-CM

## 2022-08-21 MED ORDER — SAXENDA 18 MG/3ML ~~LOC~~ SOPN
3.0000 mg | PEN_INJECTOR | Freq: Every day | SUBCUTANEOUS | 11 refills | Status: DC
  Filled 2022-08-21: qty 15, 30d supply, fill #0
  Filled 2022-09-30: qty 15, 30d supply, fill #1
  Filled 2022-11-09: qty 15, 30d supply, fill #2
  Filled 2022-12-16: qty 15, 30d supply, fill #3

## 2022-08-21 MED ORDER — WEGOVY 0.25 MG/0.5ML ~~LOC~~ SOAJ
0.2500 mg | SUBCUTANEOUS | 0 refills | Status: DC
  Filled 2022-08-21: qty 2, 28d supply, fill #0

## 2022-08-21 NOTE — Assessment & Plan Note (Signed)
-   insurance denial of rybelsus  - not much improvement with weight  - have ordered wegovy for weight loss help - continue diet and exercise modifications.

## 2022-08-21 NOTE — Addendum Note (Signed)
Addended by: Owens Loffler on: 08/21/2022 12:18 PM   Modules accepted: Orders

## 2022-08-21 NOTE — Telephone Encounter (Addendum)
Initiated Prior authorization VCB:[id number] 18MG /3ML pen-injectors Via: Covermymeds Case/Key:[id number] Status: approved  as of 08/21/22 Reason:The authorization is effective from 08/24/2022 to 12/14/2022 Notified Pt via: Mychart

## 2022-08-21 NOTE — Progress Notes (Addendum)
Established patient visit   Patient: Madison [name]   DOB: 11-13-1980   41 y.o. Female  MRN: [id number] Visit Date: 08/21/2022  Today's healthcare provider: Owens Loffler, DO   Chief Complaint  Patient presents with   Follow-up    SUBJECTIVE    Chief Complaint  Patient presents with   Follow-up   HPI  Pt is follow up on Ryblesus. She had an insurance denial. She has been working on diet and exercise and notes it has been going well. She did go to the beach and says she probably snacked too much on vacation, but she is back now and eating healthy. She has been increasing exercise with going to [facility].   Review of Systems  Constitutional:  Negative for activity change, fatigue and fever.  Respiratory:  Negative for cough and shortness of breath.   Cardiovascular:  Negative for chest pain.  Gastrointestinal:  Negative for abdominal pain.  Genitourinary:  Negative for difficulty urinating.       Current Meds  Medication Sig   WEGOVY 0.25 MG/0.5ML SOAJ Inject 0.25 mg into the skin once a week. Use this dose for 1 month (4 shots) and then increase to next higher dose.    OBJECTIVE    BP 121/82   Pulse 82   Ht 5\' 4"  (1.626 m)   Wt 220 lb (99.8 kg)   SpO2 100%   BMI 37.76 kg/m   Physical Exam Vitals and nursing note reviewed.  Constitutional:      General: She is not in acute distress.    Appearance: Normal appearance.  HENT:     Head: Normocephalic and atraumatic.     Right Ear: External ear normal.     Left Ear: External ear normal.     Nose: Nose normal.  Eyes:     Conjunctiva/sclera: Conjunctivae normal.  Cardiovascular:     Rate and Rhythm: Normal rate and regular rhythm.  Pulmonary:     Effort: Pulmonary effort is normal.     Breath sounds: Normal breath sounds.  Neurological:     General: No focal deficit present.     Mental Status: She is alert and oriented to person, place, and time.  Psychiatric:        Mood and Affect: Mood normal.         Behavior: Behavior normal.        Thought Content: Thought content normal.        Judgment: Judgment normal.          ASSESSMENT & PLAN    Problem List Items Addressed This Visit       Other   Obesity (BMI 35.0-39.9 without comorbidity) - Primary    - insurance denial of rybelsus  - not much improvement with weight  - have ordered wegovy for weight loss help - continue diet and exercise modifications.      Relevant Medications   WEGOVY 0.25 MG/0.5ML SOAJ    Return pending wegovy status.      Meds ordered this encounter  Medications   WEGOVY 0.25 MG/0.5ML SOAJ    Sig: Inject 0.25 mg into the skin once a week. Use this dose for 1 month (4 shots) and then increase to next higher dose.    Dispense:  2 mL    Refill:  0    No orders of the defined types were placed in this encounter.  Jerene Pitch on back order will go ahead and start  saxxenda  Charlton Amor, DO  [facility] [phone] (phone) [phone] (fax)  [facility]

## 2022-09-01 ENCOUNTER — Other Ambulatory Visit (HOSPITAL_COMMUNITY): Payer: Self-pay

## 2022-09-01 ENCOUNTER — Encounter: Payer: Self-pay | Admitting: Family Medicine

## 2022-09-01 MED ORDER — TECHLITE PEN NEEDLES 32G X 4 MM MISC
0 refills | Status: DC
  Filled 2022-09-01: qty 100, 90d supply, fill #0

## 2022-09-04 ENCOUNTER — Ambulatory Visit
Admission: RE | Admit: 2022-09-04 | Discharge: 2022-09-04 | Disposition: A | Discharge status: Home / Self Care | Payer: 59 | Source: Ambulatory Visit | Attending: Family Medicine | Admitting: Family Medicine

## 2022-09-04 DIAGNOSIS — Z1231 Encounter for screening mammogram for malignant neoplasm of breast: Secondary | ICD-10-CM

## 2022-09-05 ENCOUNTER — Other Ambulatory Visit (HOSPITAL_COMMUNITY): Payer: Self-pay

## 2022-09-07 ENCOUNTER — Other Ambulatory Visit (HOSPITAL_COMMUNITY): Payer: Self-pay

## 2022-09-10 ENCOUNTER — Other Ambulatory Visit (HOSPITAL_COMMUNITY): Payer: Self-pay

## 2022-09-21 ENCOUNTER — Ambulatory Visit: Payer: 59 | Admitting: Cardiology

## 2022-09-30 ENCOUNTER — Other Ambulatory Visit (HOSPITAL_COMMUNITY): Payer: Self-pay

## 2022-09-30 ENCOUNTER — Other Ambulatory Visit: Payer: Self-pay | Admitting: Cardiology

## 2022-10-03 ENCOUNTER — Other Ambulatory Visit: Payer: Self-pay | Admitting: Cardiology

## 2022-10-05 ENCOUNTER — Other Ambulatory Visit (HOSPITAL_COMMUNITY): Payer: Self-pay

## 2022-10-05 ENCOUNTER — Other Ambulatory Visit (HOSPITAL_BASED_OUTPATIENT_CLINIC_OR_DEPARTMENT_OTHER): Payer: Self-pay

## 2022-10-05 MED ORDER — DILTIAZEM HCL ER COATED BEADS 300 MG PO CP24
300.0000 mg | ORAL_CAPSULE | Freq: Every day | ORAL | 0 refills | Status: DC
  Filled 2022-10-05: qty 30, 30d supply, fill #0

## 2022-10-07 ENCOUNTER — Ambulatory Visit: Payer: 59 | Admitting: Cardiology

## 2022-10-13 ENCOUNTER — Ambulatory Visit (INDEPENDENT_AMBULATORY_CARE_PROVIDER_SITE_OTHER): Payer: 59 | Admitting: Family Medicine

## 2022-10-13 ENCOUNTER — Other Ambulatory Visit (HOSPITAL_COMMUNITY): Payer: Self-pay

## 2022-10-13 ENCOUNTER — Encounter: Payer: Self-pay | Admitting: Family Medicine

## 2022-10-13 VITALS — BP 117/77 | HR 80 | Ht 64.0 in | Wt 221.0 lb

## 2022-10-13 DIAGNOSIS — E669 Obesity, unspecified: Secondary | ICD-10-CM | POA: Diagnosis not present

## 2022-10-13 DIAGNOSIS — J019 Acute sinusitis, unspecified: Secondary | ICD-10-CM | POA: Diagnosis not present

## 2022-10-13 DIAGNOSIS — Z6841 Body Mass Index (BMI) 40.0 and over, adult: Secondary | ICD-10-CM

## 2022-10-13 DIAGNOSIS — E66813 Obesity, class 3: Secondary | ICD-10-CM

## 2022-10-13 DIAGNOSIS — B9689 Other specified bacterial agents as the cause of diseases classified elsewhere: Secondary | ICD-10-CM | POA: Diagnosis not present

## 2022-10-13 MED ORDER — DOXYCYCLINE HYCLATE 100 MG PO TABS
100.0000 mg | ORAL_TABLET | Freq: Two times a day (BID) | ORAL | 0 refills | Status: DC
Start: 2022-10-13 — End: 2022-10-20
  Filled 2022-10-13: qty 14, 7d supply, fill #0

## 2022-10-13 NOTE — Progress Notes (Signed)
Established patient visit   Patient: Madison [name]   DOB: August 15, 1981   41 y.o. Female  MRN: [id number] Visit Date: 10/13/2022  Today's healthcare provider: Charlton Amor, DO   Chief Complaint  Patient presents with   Follow-up    SUBJECTIVE    Chief Complaint  Patient presents with   Follow-up   HPI  Pt presents for follow up on saxxenda. She is on saxxenda 3mg  weekly. Weight today is 221lb which is one pound up from previously. She is noting changes in her clothes fitting  A few weeks ago she has noted an increase in congestion. She is admitting to sinus pain and pressure that has been ongoing for 6 weeks. She has left ear otalgia. Denies fever/chills.    Review of Systems  Constitutional:  Negative for activity change, fatigue and fever.  HENT:  Positive for congestion and sinus pain.   Respiratory:  Negative for cough and shortness of breath.   Cardiovascular:  Negative for chest pain.  Gastrointestinal:  Negative for abdominal pain.  Genitourinary:  Negative for difficulty urinating.       Current Meds  Medication Sig   busPIRone (BUSPAR) 15 MG tablet Take 1 tablet (15 mg total) by mouth 2 (two) times daily.   diltiazem (CARDIZEM CD) 300 MG 24 hr capsule Take 1 capsule (300 mg total) by mouth daily.   doxycycline (VIBRA-TABS) 100 MG tablet Take 1 tablet (100 mg total) by mouth 2 (two) times daily for 7 days.   EPINEPHrine 0.3 mg/0.3 mL IJ SOAJ injection Inject as directed in emergency situation for severe allergic reaction   escitalopram (LEXAPRO) 10 MG tablet Take 1 tablet (10 mg total) by mouth daily.   Insulin Pen Needle (TECHLITE PEN NEEDLES) 32G X 4 MM MISC Use as directed with saxenda.   levothyroxine (SYNTHROID) 112 MCG tablet Take 1 tablet (112 mcg total) by mouth daily before breakfast.   Liraglutide -Weight Management (SAXENDA) 18 MG/3ML SOPN Inject 0.6 mg under the skin daily for 1 week, then increase by 0.6 mg weekly until reaching 3 mg injected  daily   loratadine (CLARITIN) 10 MG tablet Take 10 mg by mouth daily.   Norgestimate-Ethinyl Estradiol Triphasic (TRI-VYLIBRA) 0.18/0.215/0.25 MG-35 MCG tablet Take 1 tablet by mouth daily    OBJECTIVE    BP 117/77   Pulse 80   Ht 5\' 4"  (1.626 m)   Wt 221 lb (100.2 kg)   SpO2 99%   BMI 37.93 kg/m   Physical Exam Vitals and nursing note reviewed.  Constitutional:      General: She is not in acute distress.    Appearance: Normal appearance.  HENT:     Head: Normocephalic and atraumatic.     Comments: Tenderness to palpation of maxillary and frontal sinuses bilaterally    Right Ear: Tympanic membrane, ear canal and external ear normal.     Left Ear: Tympanic membrane, ear canal and external ear normal.     Nose: Congestion present.     Comments: Erythema of nares bilaterally Eyes:     Conjunctiva/sclera: Conjunctivae normal.  Cardiovascular:     Rate and Rhythm: Normal rate and regular rhythm.  Pulmonary:     Effort: Pulmonary effort is normal.     Breath sounds: Normal breath sounds.  Neurological:     General: No focal deficit present.     Mental Status: She is alert and oriented to person, place, and time.  Psychiatric:  Mood and Affect: Mood normal.        Behavior: Behavior normal.        Thought Content: Thought content normal.        Judgment: Judgment normal.        ASSESSMENT & PLAN    Problem List Items Addressed This Visit       Respiratory   Acute bacterial sinusitis - Primary    - pleasant 41 yo presents with 6 weeks of sinus pain/pressure with sinus pain on exam. Will go ahead and treat with doxycycline  - encouraged nasal spray for when she feels ear fullness--no fluid seen on ears today on physical exam.       Relevant Medications   doxycycline (VIBRA-TABS) 100 MG tablet     Other   Class 3 severe obesity without serious comorbidity with body mass index (BMI) of 40.0 to 44.9 in adult [facility])    - pt on saxxenda and says she has been  tolerating the 3mg  well. Weight has been stable (seems to be 1lb up) but patient says that her clothes are fitting differently - will go ahead and continue since she just picked up a new vial  - did also discuss other weight loss options-mentioned the pharmacy in [place] that has wegovy as well as doing a compound wegovy for $180 for about two month supply. She is currently paying $120/month for the saxxenda  - encouraged continued diet and exercise.       Obesity (BMI 35.0-39.9 without comorbidity)    Return for her scheduled follow up in March.      Meds ordered this encounter  Medications   doxycycline (VIBRA-TABS) 100 MG tablet    Sig: Take 1 tablet (100 mg total) by mouth 2 (two) times daily for 7 days.    Dispense:  14 tablet    Refill:  0    No orders of the defined types were placed in this encounter.    April, DO  [facility] [phone] (phone) [phone] (fax)  [facility]

## 2022-10-13 NOTE — Assessment & Plan Note (Signed)
-   pleasant 41 yo presents with 6 weeks of sinus pain/pressure with sinus pain on exam. Will go ahead and treat with doxycycline  - encouraged nasal spray for when she feels ear fullness--no fluid seen on ears today on physical exam.

## 2022-10-13 NOTE — Assessment & Plan Note (Addendum)
-   pt on saxxenda and says she has been tolerating the 3mg  well. Weight has been stable (seems to be 1lb up) but patient says that her clothes are fitting differently - will go ahead and continue since she just picked up a new vial  - did also discuss other weight loss options-mentioned the pharmacy in [place] that has wegovy as well as doing a compound wegovy for $180 for about two month supply. She is currently paying $120/month for the saxxenda  - encouraged continued diet and exercise.

## 2022-10-19 ENCOUNTER — Other Ambulatory Visit (HOSPITAL_COMMUNITY): Payer: Self-pay

## 2022-10-20 ENCOUNTER — Encounter: Payer: Self-pay | Admitting: Family Medicine

## 2022-10-20 ENCOUNTER — Other Ambulatory Visit (HOSPITAL_COMMUNITY): Payer: Self-pay

## 2022-10-20 ENCOUNTER — Other Ambulatory Visit: Payer: Self-pay | Admitting: Family Medicine

## 2022-10-20 MED ORDER — AMOXICILLIN-POT CLAVULANATE 875-125 MG PO TABS
1.0000 | ORAL_TABLET | Freq: Two times a day (BID) | ORAL | 0 refills | Status: AC
  Filled 2022-10-20: qty 14, 7d supply, fill #0

## 2022-10-28 ENCOUNTER — Other Ambulatory Visit (HOSPITAL_COMMUNITY): Payer: Self-pay

## 2022-10-28 MED ORDER — NORGESTIM-ETH ESTRAD TRIPHASIC 0.18/0.215/0.25 MG-35 MCG PO TABS
1.0000 | ORAL_TABLET | Freq: Every day | ORAL | 0 refills | Status: DC
  Filled 2022-10-28: qty 84, 84d supply, fill #0

## 2022-10-30 ENCOUNTER — Other Ambulatory Visit (HOSPITAL_COMMUNITY): Payer: Self-pay

## 2022-10-30 MED ORDER — CEPHALEXIN 500 MG PO CAPS
500.0000 mg | ORAL_CAPSULE | Freq: Two times a day (BID) | ORAL | 0 refills | Status: DC
  Filled 2022-10-30: qty 20, 10d supply, fill #0

## 2022-11-03 ENCOUNTER — Other Ambulatory Visit (HOSPITAL_COMMUNITY): Payer: Self-pay

## 2022-11-03 ENCOUNTER — Other Ambulatory Visit: Payer: Self-pay | Admitting: Cardiology

## 2022-11-03 MED ORDER — DILTIAZEM HCL ER COATED BEADS 300 MG PO CP24
300.0000 mg | ORAL_CAPSULE | Freq: Every day | ORAL | 0 refills | Status: DC
  Filled 2022-11-03: qty 30, 30d supply, fill #0

## 2022-11-11 ENCOUNTER — Ambulatory Visit: Payer: Commercial Managed Care - PPO | Attending: Cardiology | Admitting: Cardiology

## 2022-11-11 ENCOUNTER — Other Ambulatory Visit (HOSPITAL_COMMUNITY): Payer: Self-pay

## 2022-11-11 ENCOUNTER — Encounter: Payer: Self-pay | Admitting: Cardiology

## 2022-11-11 VITALS — BP 124/84 | HR 82 | Ht 64.0 in | Wt 218.0 lb

## 2022-11-11 DIAGNOSIS — I471 Supraventricular tachycardia, unspecified: Secondary | ICD-10-CM | POA: Diagnosis not present

## 2022-11-11 MED ORDER — METOPROLOL SUCCINATE ER 100 MG PO TB24
100.0000 mg | ORAL_TABLET | Freq: Every day | ORAL | 11 refills | Status: DC
  Filled 2022-11-11: qty 30, 30d supply, fill #0

## 2022-11-11 NOTE — Progress Notes (Signed)
Electrophysiology Office Note   Date:  11/11/2022   ID:  Madison [name], DOB 1981-02-13, MRN [id number]  PCP:  Owens Loffler, DO  Cardiologist:  Radford Pax Primary Electrophysiologist:  Constance Haw, MD    No chief complaint on file.     History of Present Illness: Madison [name] is a 42 y.o. female who presents today for electrophysiology evaluation.     She has a history significant for hypothyroidism and SVT, both diagnosed 10 years ago.  She presented emergency room 11/24/2016 with palpitations and was found to be in SVT.  Heart rates were 170 bpm.  She converted to sinus rhythm prior to the ER physician seeing her.  She had another episode of heart rates of 210.  She is currently on Cardizem.  Today, denies symptoms of palpitations, chest pain, shortness of breath, orthopnea, PND, lower extremity edema, claudication, dizziness, presyncope, syncope, bleeding, or neurologic sequela. The patient is tolerating medications without difficulties.  Since being seen she has done well.  She continues to have short episodes of SVT.  Despite this, she is happy with her control.  She would like to stop the diltiazem as she cannot eat grapefruit with this medication.  She would like to start metoprolol.   Past Medical History:  Diagnosis Date   Allergy    Anxiety    Asthma    Back pain    Constipation    Eczema    Exercise-induced bronchospasm 06/12/2016   GERD (gastroesophageal reflux disease)    Herpes simplex disease    Hyperlipidemia    Hypothyroidism    Insomnia    Joint pain    Palpitation    SOB (shortness of breath)    SVT (supraventricular tachycardia) 11/30/2016   Swelling    bilat LE   Urticaria    Past Surgical History:  Procedure Laterality Date   Gum Grafting  11/2015, 06/2017   KNEE ARTHROSCOPY Right 2001   WISDOM TOOTH EXTRACTION Bilateral 1997     Current Outpatient Medications  Medication Sig Dispense Refill   busPIRone (BUSPAR) 15 MG tablet Take 1  tablet (15 mg total) by mouth 2 (two) times daily. (Patient taking differently: Take 15 mg by mouth as needed.) 10 tablet 0   cephALEXin (KEFLEX) 500 MG capsule Take 1 capsule (500 mg total) by mouth 2 (two) times daily. 20 capsule 0   diltiazem (CARDIZEM CD) 300 MG 24 hr capsule Take 1 capsule (300 mg total) by mouth daily. 30 capsule 0   EPINEPHrine 0.3 mg/0.3 mL IJ SOAJ injection Inject as directed in emergency situation for severe allergic reaction 2 each 0   escitalopram (LEXAPRO) 10 MG tablet Take 1 tablet (10 mg total) by mouth daily. 90 tablet 3   Insulin Pen Needle (TECHLITE PEN NEEDLES) 32G X 4 MM MISC Use as directed with saxenda. 100 each 0   levothyroxine (SYNTHROID) 112 MCG tablet Take 1 tablet (112 mcg total) by mouth daily before breakfast. 90 tablet 3   Liraglutide -Weight Management (SAXENDA) 18 MG/3ML SOPN Inject 0.6 mg under the skin daily for 1 week, then increase by 0.6 mg weekly until reaching 3 mg injected daily 15 mL 11   loratadine (CLARITIN) 10 MG tablet Take 10 mg by mouth daily.     Norgestimate-Ethinyl Estradiol Triphasic (TRI-VYLIBRA) 0.18/0.215/0.25 MG-35 MCG tablet Take 1 tablet by mouth daily. 84 tablet 0   No current facility-administered medications for this visit.    Allergies:   Shellfish-derived products, Trazodone,  Latex, and Neosporin [neomycin-bacitracin zn-polymyx]   Social History:  The patient  reports that she has never smoked. She has never used smokeless tobacco. She reports current alcohol use of about 4.0 standard drinks of alcohol per week. She reports that she does not use drugs.   Family History:  The patient's family history includes Eczema in her father; High Cholesterol in her mother; High blood pressure in her mother; Sleep apnea in her father; Thyroid disease in her mother.   ROS:  Please see the history of present illness.   Otherwise, review of systems is positive for none.   All other systems are reviewed and negative.   PHYSICAL  EXAM: VS:  BP 124/84   Pulse 82   Ht 5\' 4"  (1.626 m)   Wt 218 lb (98.9 kg)   SpO2 99%   BMI 37.42 kg/m  , BMI Body mass index is 37.42 kg/m. GEN: Well nourished, well developed, in no acute distress  HEENT: normal  Neck: no JVD, carotid bruits, or masses Cardiac: RRR; no murmurs, rubs, or gallops,no edema  Respiratory:  clear to auscultation bilaterally, normal work of breathing GI: soft, nontender, nondistended, + BS MS: no deformity or atrophy  Skin: warm and dry Neuro:  Strength and sensation are intact Psych: euthymic mood, full affect  EKG:  EKG is ordered today. Personal review of the ekg ordered shows sinus rhythm    Recent Labs: No results found for requested labs within last 365 days.    Lipid Panel     Component Value Date/Time   CHOL 266 (H) 06/26/2022 0000   CHOL 218 (H) 04/13/2019 0000   TRIG 116 06/26/2022 0000   HDL 75 06/26/2022 0000   HDL 73 04/13/2019 0000   CHOLHDL 3.5 06/26/2022 0000   LDLCALC 167 (H) 06/26/2022 0000     Wt Readings from Last 3 Encounters:  11/11/22 218 lb (98.9 kg)  10/13/22 221 lb (100.2 kg)  08/21/22 220 lb (99.8 kg)      Other studies Reviewed: Additional studies/ records that were reviewed today include: TTE 12/22/16, 30 day monitor 01/12/17  Review of the above records today demonstrates:  - Left ventricle: The cavity size was normal. Wall thickness was   normal. Systolic function was normal. The estimated ejection   fraction was in the range of 55% to 60%. Wall motion was normal;   there were no regional wall motion abnormalities. Left   ventricular diastolic function parameters were normal. - Mitral valve: There was mild regurgitation. - Left atrium: The atrium was mildly dilated.    Normal sinus rhythm with average heart rate 87bpm. The heart rate ranged from 61 to 175bpm. SVT - likely sinus tachycardia up to 175bpm Rare PAC and PVC   ASSESSMENT AND PLAN:  1.  SVT: Currently on diltiazem 300 mg daily.  Has  30 mg as needed tabs as well.  Palpitations.  Despite that, she would like to get off of the diltiazem as she cannot eat grapefruit.  Lavene Penagos stop diltiazem and start Toprol-XL 100 mg.  I told her that we can go up to 200 mg if she starts to have more frequent episodes.   Current medicines are reviewed at length with the patient today.   The patient does not have concerns regarding her medicines.  The following changes were made today: Stop diltiazem, start Toprol XL  Labs/ tests ordered today include:  Orders Placed This Encounter  Procedures   EKG 12-Lead  Disposition:   FU 12 months  Signed, Woodley Petzold Meredith Leeds, MD  11/11/2022 8:53 AM     [facility] [address] [place] [address] [phone] (office) [phone] (fax)

## 2022-11-11 NOTE — Addendum Note (Signed)
Addended by: Stanton Kidney on: 11/11/2022 08:58 AM   Modules accepted: Orders

## 2022-11-11 NOTE — Patient Instructions (Signed)
Medication Instructions:  Your physician has recommended you make the following change in your medication:  STOP Diltiazem START Toprol XL 100 mg once daily at bedtime  *If you need a refill on your cardiac medications before your next appointment, please call your pharmacy*   Lab Work: None ordered   Testing/Procedures: None ordered   Follow-Up: At [facility], you and your health needs are our priority.  As part of our continuing mission to provide you with exceptional heart care, we have created designated Provider Care Teams.  These Care Teams include your primary Cardiologist (physician) and Advanced Practice Providers (APPs -  Physician Assistants and Nurse Practitioners) who all work together to provide you with the care you need, when you need it.    Your next appointment:   1 year(s)  The format for your next appointment:   In Person  Provider:   Allegra Lai, MD{   Thank you for choosing [facility]!!   Trinidad Curet, RN [phone]  Other Instructions    Important Information About Sugar

## 2022-11-13 ENCOUNTER — Other Ambulatory Visit (HOSPITAL_COMMUNITY): Payer: Self-pay

## 2022-11-13 ENCOUNTER — Encounter: Payer: Self-pay | Admitting: Family Medicine

## 2022-11-16 ENCOUNTER — Telehealth: Payer: Self-pay

## 2022-11-16 ENCOUNTER — Other Ambulatory Visit (HOSPITAL_COMMUNITY): Payer: Self-pay

## 2022-11-16 ENCOUNTER — Encounter: Payer: Self-pay | Admitting: Cardiology

## 2022-11-16 ENCOUNTER — Other Ambulatory Visit: Payer: Self-pay

## 2022-11-16 MED ORDER — DILTIAZEM HCL ER COATED BEADS 300 MG PO CP24
300.0000 mg | ORAL_CAPSULE | Freq: Every day | ORAL | 2 refills | Status: DC
  Filled 2022-11-16: qty 90, 90d supply, fill #0
  Filled 2023-02-10: qty 90, 90d supply, fill #1
  Filled 2023-05-12: qty 90, 90d supply, fill #2

## 2022-11-16 NOTE — Telephone Encounter (Addendum)
Initiated Prior authorization DHW:YSHUOHF 18MG /3ML pen-injectors Via: Covermymeds Case/Key:[id number] Status: approved as of 11/16/21 Reason: The request has been approved. The authorization is effective from 12/16/2022 to 12/16/2023 Notified Pt via: Mychart

## 2022-11-16 NOTE — Telephone Encounter (Signed)
Message sent to pt:  I am sorry to hear.  Yes go back to Diltiazem. I will send in Rx for you. Please let me know if need to re-address if have issues with SVT.

## 2022-11-17 ENCOUNTER — Other Ambulatory Visit (HOSPITAL_COMMUNITY): Payer: Self-pay

## 2022-11-17 DIAGNOSIS — L600 Ingrowing nail: Secondary | ICD-10-CM | POA: Diagnosis not present

## 2022-11-27 ENCOUNTER — Other Ambulatory Visit (HOSPITAL_COMMUNITY): Payer: Self-pay

## 2022-11-27 MED ORDER — CEPHALEXIN 500 MG PO CAPS
500.0000 mg | ORAL_CAPSULE | Freq: Two times a day (BID) | ORAL | 0 refills | Status: DC
  Filled 2022-11-27: qty 14, 7d supply, fill #0

## 2022-12-07 ENCOUNTER — Other Ambulatory Visit (HOSPITAL_COMMUNITY): Payer: Self-pay

## 2022-12-07 ENCOUNTER — Other Ambulatory Visit: Payer: Self-pay | Admitting: Family Medicine

## 2022-12-07 MED ORDER — TECHLITE PEN NEEDLES 32G X 4 MM MISC
0 refills | Status: DC
  Filled 2022-12-07 (×2): qty 100, 90d supply, fill #0

## 2022-12-08 ENCOUNTER — Other Ambulatory Visit (HOSPITAL_COMMUNITY): Payer: Self-pay

## 2022-12-17 ENCOUNTER — Other Ambulatory Visit: Payer: Self-pay

## 2022-12-31 ENCOUNTER — Other Ambulatory Visit (HOSPITAL_COMMUNITY): Payer: Self-pay

## 2023-01-17 ENCOUNTER — Other Ambulatory Visit (HOSPITAL_COMMUNITY): Payer: Self-pay

## 2023-01-18 ENCOUNTER — Other Ambulatory Visit (HOSPITAL_COMMUNITY): Payer: Self-pay

## 2023-01-18 ENCOUNTER — Other Ambulatory Visit: Payer: Self-pay | Admitting: Family Medicine

## 2023-01-18 MED ORDER — NORGESTIM-ETH ESTRAD TRIPHASIC 0.18/0.215/0.25 MG-35 MCG PO TABS
1.0000 | ORAL_TABLET | Freq: Every day | ORAL | 0 refills | Status: DC
  Filled 2023-01-18: qty 28, 28d supply, fill #0

## 2023-01-19 ENCOUNTER — Encounter: Payer: Self-pay | Admitting: Family Medicine

## 2023-01-21 ENCOUNTER — Other Ambulatory Visit: Payer: Self-pay | Admitting: Family Medicine

## 2023-01-21 ENCOUNTER — Other Ambulatory Visit (HOSPITAL_COMMUNITY): Payer: Self-pay

## 2023-01-26 ENCOUNTER — Other Ambulatory Visit: Payer: Self-pay

## 2023-01-26 NOTE — Progress Notes (Unsigned)
     Established patient visit   Patient: HARMONEE KREKELER   DOB: June 07, 1981   42 y.o. Female  MRN: [id number] Visit Date: 01/27/2023  Today's healthcare provider: Owens Loffler, DO   No chief complaint on file.   SUBJECTIVE   No chief complaint on file.  HPI  Pt presents for weight loss management. She had tried saxxenda but did not see any weight loss results.   Review of Systems     No outpatient medications have been marked as taking for the 01/27/23 encounter (Appointment) with Owens Loffler, DO.    OBJECTIVE    There were no vitals taken for this visit.  Physical Exam   {Show previous labs (optional):23736}    ASSESSMENT & PLAN    Problem List Items Addressed This Visit   None   No follow-ups on file.      No orders of the defined types were placed in this encounter.   No orders of the defined types were placed in this encounter.    Owens Loffler, DO  [place] [facility] [phone] (phone) [phone] (fax)  [place]

## 2023-01-27 ENCOUNTER — Other Ambulatory Visit (HOSPITAL_COMMUNITY): Payer: Self-pay

## 2023-01-27 ENCOUNTER — Encounter: Payer: Self-pay | Admitting: Family Medicine

## 2023-01-27 ENCOUNTER — Ambulatory Visit: Payer: Commercial Managed Care - PPO | Admitting: Family Medicine

## 2023-01-27 VITALS — BP 126/75 | HR 71 | Ht 64.0 in | Wt 224.0 lb

## 2023-01-27 DIAGNOSIS — R635 Abnormal weight gain: Secondary | ICD-10-CM

## 2023-01-27 DIAGNOSIS — E559 Vitamin D deficiency, unspecified: Secondary | ICD-10-CM | POA: Diagnosis not present

## 2023-01-27 DIAGNOSIS — Z309 Encounter for contraceptive management, unspecified: Secondary | ICD-10-CM

## 2023-01-27 DIAGNOSIS — E6609 Other obesity due to excess calories: Secondary | ICD-10-CM | POA: Insufficient documentation

## 2023-01-27 DIAGNOSIS — R5383 Other fatigue: Secondary | ICD-10-CM

## 2023-01-27 DIAGNOSIS — Z6838 Body mass index (BMI) 38.0-38.9, adult: Secondary | ICD-10-CM | POA: Diagnosis not present

## 2023-01-27 MED ORDER — EPINEPHRINE 0.3 MG/0.3ML IJ SOAJ
INTRAMUSCULAR | 0 refills | Status: DC
  Filled 2023-01-27: qty 2, 2d supply, fill #0

## 2023-01-27 MED ORDER — NORGESTIM-ETH ESTRAD TRIPHASIC 0.18/0.215/0.25 MG-35 MCG PO TABS
1.0000 | ORAL_TABLET | Freq: Every day | ORAL | 0 refills | Status: DC
  Filled 2023-01-27 – 2023-02-15 (×2): qty 28, 28d supply, fill #0

## 2023-01-27 MED ORDER — NALTREXONE-BUPROPION HCL ER 8-90 MG PO TB12
2.0000 | ORAL_TABLET | Freq: Two times a day (BID) | ORAL | 2 refills | Status: DC
  Filled 2023-01-27 – 2023-02-05 (×6): qty 120, 30d supply, fill #0
  Filled 2023-03-06: qty 120, 30d supply, fill #1
  Filled 2023-04-07: qty 120, 30d supply, fill #2

## 2023-01-27 NOTE — Assessment & Plan Note (Signed)
Patient has had some weight gain since her last visit.  She has started working out at [employer] and eating a [employer].  However, insurance will not cover any weight loss medications such as Zach bound or [id number].  We are limited to Contrave, Qsymia, or phentermine.  Based on discussion with the patient I believe it is best to start Contrave with her.  We did discuss that Contrave is a combination medication that requires a taper/titration.  Discussed side effects and monitor parameters.  Heart rate was normal today on today's visit as well as blood pressure. - Have also sent referral to [facility] to see if we can get any added help. If patient does not lose 4% of her weight in 3 months we will have to stop medication.

## 2023-01-27 NOTE — Addendum Note (Signed)
Addended by: Burton Apley A on: 01/27/2023 10:26 AM   Modules accepted: Orders

## 2023-01-27 NOTE — Assessment & Plan Note (Signed)
Needs refill on her birth control, sent in

## 2023-01-27 NOTE — Addendum Note (Signed)
Addended by: Burton Apley A on: 01/27/2023 10:56 AM   Modules accepted: Orders

## 2023-01-27 NOTE — Assessment & Plan Note (Signed)
TSH ordered.

## 2023-01-27 NOTE — Assessment & Plan Note (Signed)
Have ordered vitamin D and B12 level

## 2023-01-27 NOTE — Patient Instructions (Addendum)
1 tablet twice daily for 1 week; then 2 tablets in the morning and 1 tablet in the evening for 1 week; and then 2 tablets twice daily   Watch blood pressure and heart rate

## 2023-01-28 ENCOUNTER — Other Ambulatory Visit (HOSPITAL_COMMUNITY): Payer: Self-pay

## 2023-01-28 LAB — TSH+FREE T4
Free T4: 1.31 ng/dL (ref 0.82–1.77)
TSH: 2.08 u[IU]/mL (ref 0.450–4.500)

## 2023-01-28 LAB — VITAMIN D 25 HYDROXY (VIT D DEFICIENCY, FRACTURES): Vit D, 25-Hydroxy: 52 ng/mL (ref 30.0–100.0)

## 2023-01-28 LAB — VITAMIN B12: Vitamin B-12: 334 pg/mL (ref 232–1245)

## 2023-02-01 ENCOUNTER — Other Ambulatory Visit (HOSPITAL_COMMUNITY): Payer: Self-pay

## 2023-02-02 ENCOUNTER — Encounter: Payer: Self-pay | Admitting: Family Medicine

## 2023-02-03 ENCOUNTER — Encounter: Payer: Self-pay | Admitting: Family Medicine

## 2023-02-03 ENCOUNTER — Other Ambulatory Visit (HOSPITAL_COMMUNITY): Payer: Self-pay

## 2023-02-03 ENCOUNTER — Telehealth: Payer: Self-pay

## 2023-02-03 NOTE — Telephone Encounter (Addendum)
Initiated Prior authorization [id number] 8-90MG  er tablets Via: Covermymeds Case/Key:[id number] Status:  approved  as of 02/03/23 Reason:Authorization Expiration Date: 06/03/2023  Notified Pt via: Mychart

## 2023-02-04 ENCOUNTER — Other Ambulatory Visit (HOSPITAL_COMMUNITY): Payer: Self-pay

## 2023-02-05 ENCOUNTER — Other Ambulatory Visit (HOSPITAL_COMMUNITY): Payer: Self-pay

## 2023-02-15 ENCOUNTER — Other Ambulatory Visit: Payer: Self-pay

## 2023-02-15 ENCOUNTER — Other Ambulatory Visit (HOSPITAL_COMMUNITY): Payer: Self-pay

## 2023-02-16 ENCOUNTER — Other Ambulatory Visit: Payer: Self-pay

## 2023-03-12 ENCOUNTER — Other Ambulatory Visit: Payer: Self-pay | Admitting: Family Medicine

## 2023-03-12 ENCOUNTER — Other Ambulatory Visit (HOSPITAL_COMMUNITY): Payer: Self-pay

## 2023-03-12 MED ORDER — NORGESTIM-ETH ESTRAD TRIPHASIC 0.18/0.215/0.25 MG-35 MCG PO TABS
1.0000 | ORAL_TABLET | Freq: Every day | ORAL | 0 refills | Status: DC
  Filled 2023-03-12: qty 28, 28d supply, fill #0

## 2023-03-17 ENCOUNTER — Encounter (INDEPENDENT_AMBULATORY_CARE_PROVIDER_SITE_OTHER): Payer: Commercial Managed Care - PPO | Admitting: Family Medicine

## 2023-03-17 DIAGNOSIS — Z308 Encounter for other contraceptive management: Secondary | ICD-10-CM

## 2023-03-18 ENCOUNTER — Other Ambulatory Visit (HOSPITAL_COMMUNITY): Payer: Self-pay

## 2023-03-18 MED ORDER — NORGESTIM-ETH ESTRAD TRIPHASIC 0.18/0.215/0.25 MG-35 MCG PO TABS
1.0000 | ORAL_TABLET | Freq: Every day | ORAL | 3 refills | Status: DC
  Filled 2023-03-18 – 2023-04-04 (×2): qty 28, 28d supply, fill #0
  Filled 2023-04-28: qty 28, 28d supply, fill #1
  Filled 2023-05-31: qty 28, 28d supply, fill #2
  Filled 2023-06-22: qty 28, 28d supply, fill #3

## 2023-03-18 NOTE — Telephone Encounter (Signed)
Please see the MyChart message reply(ies) for my assessment and plan.    This patient gave consent for this Medical Advice Message and is aware that it may result in a bill to [employer], as well as the possibility of receiving a bill for a co-payment or deductible. They are an established patient, but are not seeking medical advice exclusively about a problem treated during an in person or video visit in the last seven days. I did not recommend an in person or video visit within seven days of my reply.    I spent a total of 10 minutes cumulative time within 7 days through [employer].  Pt wanted a refill on her birth control have sent in a 3 month supply. Pt notes no missed doses. Does know side effects and risk of birth control.   Charlton Amor, DO

## 2023-04-02 ENCOUNTER — Other Ambulatory Visit (HOSPITAL_COMMUNITY): Payer: Self-pay

## 2023-04-04 ENCOUNTER — Telehealth: Payer: Commercial Managed Care - PPO | Admitting: Nurse Practitioner

## 2023-04-04 DIAGNOSIS — R399 Unspecified symptoms and signs involving the genitourinary system: Secondary | ICD-10-CM

## 2023-04-04 MED ORDER — NITROFURANTOIN MONOHYD MACRO 100 MG PO CAPS: 100.0000 mg | ORAL_CAPSULE | Freq: Two times a day (BID) | ORAL | 0 refills | Status: AC

## 2023-04-04 MED ORDER — NITROFURANTOIN MONOHYD MACRO 100 MG PO CAPS: 100.0000 mg | ORAL_CAPSULE | Freq: Two times a day (BID) | ORAL | 0 refills | Status: DC

## 2023-04-04 NOTE — Progress Notes (Signed)
I have spent 5 minutes in review of e-visit questionnaire, review and updating patient chart, medical decision making and response to patient.  ° °Roxi Hlavaty W Jamielee Mchale, NP ° °  °

## 2023-04-04 NOTE — Addendum Note (Signed)
Addended by: Bertram Denver on: 04/04/2023 04:38 PM   Modules accepted: Orders

## 2023-04-04 NOTE — Progress Notes (Signed)

## 2023-04-05 ENCOUNTER — Other Ambulatory Visit: Payer: Self-pay

## 2023-04-06 ENCOUNTER — Other Ambulatory Visit (HOSPITAL_COMMUNITY): Payer: Self-pay

## 2023-04-09 ENCOUNTER — Encounter: Payer: Self-pay | Admitting: Family Medicine

## 2023-04-15 ENCOUNTER — Other Ambulatory Visit: Payer: Self-pay | Admitting: Family Medicine

## 2023-04-15 ENCOUNTER — Other Ambulatory Visit (HOSPITAL_COMMUNITY): Payer: Self-pay

## 2023-04-15 MED ORDER — PHENTERMINE-TOPIRAMATE ER 3.75-23 MG PO CP24
1.0000 | ORAL_CAPSULE | Freq: Every morning | ORAL | 0 refills | Status: DC
  Filled 2023-04-15: qty 14, 14d supply, fill #0

## 2023-04-16 ENCOUNTER — Other Ambulatory Visit (HOSPITAL_COMMUNITY): Payer: Self-pay

## 2023-04-17 ENCOUNTER — Other Ambulatory Visit (HOSPITAL_COMMUNITY): Payer: Self-pay

## 2023-04-19 ENCOUNTER — Other Ambulatory Visit (HOSPITAL_COMMUNITY): Payer: Self-pay

## 2023-04-19 ENCOUNTER — Encounter: Payer: Self-pay | Admitting: Family Medicine

## 2023-04-26 ENCOUNTER — Telehealth: Payer: Self-pay

## 2023-04-26 NOTE — Telephone Encounter (Signed)
Initiated Prior authorization KGM:[id number] 3.75-23MG  er capsules Via: Covermymeds Case/Key:[id number] Status: Pending as of 04/26/23 Reason: Notified Pt via: Mychart

## 2023-04-27 ENCOUNTER — Ambulatory Visit: Payer: Commercial Managed Care - PPO | Admitting: Family Medicine

## 2023-04-28 ENCOUNTER — Other Ambulatory Visit (HOSPITAL_COMMUNITY): Payer: Self-pay

## 2023-05-12 ENCOUNTER — Other Ambulatory Visit: Payer: Self-pay

## 2023-05-12 ENCOUNTER — Other Ambulatory Visit: Payer: Self-pay | Admitting: Family Medicine

## 2023-05-12 ENCOUNTER — Other Ambulatory Visit (HOSPITAL_COMMUNITY): Payer: Self-pay

## 2023-05-17 ENCOUNTER — Other Ambulatory Visit: Payer: Self-pay | Admitting: Family Medicine

## 2023-05-17 ENCOUNTER — Other Ambulatory Visit (HOSPITAL_COMMUNITY): Payer: Self-pay

## 2023-05-22 ENCOUNTER — Other Ambulatory Visit (HOSPITAL_COMMUNITY): Payer: Self-pay

## 2023-05-23 ENCOUNTER — Other Ambulatory Visit: Payer: Self-pay | Admitting: Family Medicine

## 2023-05-26 ENCOUNTER — Other Ambulatory Visit (HOSPITAL_COMMUNITY): Payer: Self-pay

## 2023-05-28 ENCOUNTER — Encounter: Payer: Self-pay | Admitting: Family Medicine

## 2023-05-31 ENCOUNTER — Other Ambulatory Visit: Payer: Self-pay

## 2023-05-31 ENCOUNTER — Other Ambulatory Visit: Payer: Self-pay | Admitting: Family Medicine

## 2023-05-31 ENCOUNTER — Ambulatory Visit: Payer: Commercial Managed Care - PPO | Admitting: Family Medicine

## 2023-05-31 ENCOUNTER — Other Ambulatory Visit (HOSPITAL_COMMUNITY): Payer: Self-pay

## 2023-05-31 MED ORDER — PHENTERMINE-TOPIRAMATE ER 7.5-46 MG PO CP24
1.0000 | ORAL_CAPSULE | Freq: Every morning | ORAL | 0 refills | Status: DC
  Filled 2023-05-31 – 2023-06-02 (×2): qty 14, 14d supply, fill #0

## 2023-06-01 ENCOUNTER — Other Ambulatory Visit (HOSPITAL_COMMUNITY): Payer: Self-pay

## 2023-06-02 ENCOUNTER — Other Ambulatory Visit (HOSPITAL_COMMUNITY): Payer: Self-pay

## 2023-06-17 ENCOUNTER — Ambulatory Visit (INDEPENDENT_AMBULATORY_CARE_PROVIDER_SITE_OTHER): Payer: Commercial Managed Care - PPO | Admitting: Family Medicine

## 2023-06-17 ENCOUNTER — Ambulatory Visit: Payer: Commercial Managed Care - PPO | Admitting: Family Medicine

## 2023-06-17 ENCOUNTER — Encounter: Payer: Self-pay | Admitting: Family Medicine

## 2023-06-17 ENCOUNTER — Other Ambulatory Visit (HOSPITAL_COMMUNITY): Payer: Self-pay

## 2023-06-17 VITALS — BP 119/77 | HR 81 | Ht 65.25 in | Wt 234.5 lb

## 2023-06-17 DIAGNOSIS — E038 Other specified hypothyroidism: Secondary | ICD-10-CM

## 2023-06-17 DIAGNOSIS — R232 Flushing: Secondary | ICD-10-CM | POA: Diagnosis not present

## 2023-06-17 DIAGNOSIS — F411 Generalized anxiety disorder: Secondary | ICD-10-CM

## 2023-06-17 DIAGNOSIS — Z6838 Body mass index (BMI) 38.0-38.9, adult: Secondary | ICD-10-CM

## 2023-06-17 DIAGNOSIS — Z0289 Encounter for other administrative examinations: Secondary | ICD-10-CM

## 2023-06-17 DIAGNOSIS — E063 Autoimmune thyroiditis: Secondary | ICD-10-CM

## 2023-06-17 MED ORDER — BUSPIRONE HCL 15 MG PO TABS
15.0000 mg | ORAL_TABLET | Freq: Two times a day (BID) | ORAL | 0 refills | Status: AC | PRN
Start: 2023-06-17 — End: ?
  Filled 2023-06-17: qty 30, 15d supply, fill #0

## 2023-06-17 NOTE — Assessment & Plan Note (Signed)
Due to recent weight gain I want to add a TSH to make sure thyroid levels are not off.

## 2023-06-17 NOTE — Progress Notes (Signed)
Established patient visit   Patient: STEFHANIE AYRES   DOB: [birth year]   42 y.o. Female  MRN: [id number] Visit Date: 06/17/2023  Today's healthcare provider: Charlton Amor, DO   Chief Complaint  Patient presents with   Weight Management Screening    Qsymia follow up - states medication is working well without side effects.    Night Sweats    Patient c/o night sweates x few months and wondering if hormone levels should be checked.    work stress    Reqeusting rx rf of Buspar.    SUBJECTIVE    Chief Complaint  Patient presents with   Weight Management Screening    Qsymia follow up - states medication is working well without side effects.    Night Sweats    Patient c/o night sweates x few months and wondering if hormone levels should be checked.    work stress    Reqeusting rx rf of Buspar.   HPI  Pt presents for follow up on qsymia. Currently on 7.5-46mg . From last visit, she has gained 10lbs.  She has been under more stress lately.  Her partner recently lost his job of 16 years at [employer].  She says work has been more stressful.  Review of Systems  Constitutional:  Negative for activity change, fatigue and fever.  Respiratory:  Negative for cough and shortness of breath.   Cardiovascular:  Negative for chest pain.  Gastrointestinal:  Negative for abdominal pain.  Genitourinary:  Negative for difficulty urinating.       Current Meds  Medication Sig   busPIRone (BUSPAR) 15 MG tablet Take 1 tablet (15 mg total) by mouth 2 (two) times daily as needed (anxiety).   diltiazem (CARDIZEM CD) 300 MG 24 hr capsule Take 1 capsule (300 mg total) by mouth daily.   EPINEPHrine 0.3 mg/0.3 mL IJ SOAJ injection Inject as directed in emergency situation for severe allergic reaction   escitalopram (LEXAPRO) 10 MG tablet Take 1 tablet (10 mg total) by mouth daily.   levothyroxine (SYNTHROID) 112 MCG tablet Take 1 tablet (112 mcg total) by mouth daily before breakfast.   loratadine  (CLARITIN) 10 MG tablet Take 10 mg by mouth daily.   Norgestimate-Ethinyl Estradiol Triphasic (TRI-VYLIBRA) 0.18/0.215/0.25 MG-35 MCG tablet Take 1 tablet by mouth daily. Will need appointment for additional refills or 90 day supply   [DISCONTINUED] Phentermine-Topiramate 7.5-46 MG CP24 Take 1 capsule by mouth every morning.    OBJECTIVE    BP 119/77   Pulse 81   Ht 5' 5.25" (1.657 m)   Wt 234 lb 8 oz (106.4 kg)   SpO2 100%   BMI 38.72 kg/m   Physical Exam Vitals and nursing note reviewed.  Constitutional:      General: She is not in acute distress.    Appearance: Normal appearance.  HENT:     Head: Normocephalic and atraumatic.     Right Ear: External ear normal.     Left Ear: External ear normal.     Nose: Nose normal.  Eyes:     Conjunctiva/sclera: Conjunctivae normal.  Cardiovascular:     Rate and Rhythm: Normal rate and regular rhythm.  Pulmonary:     Effort: Pulmonary effort is normal.     Breath sounds: Normal breath sounds.  Neurological:     General: No focal deficit present.     Mental Status: She is alert and oriented to person, place, and time.  Psychiatric:  Mood and Affect: Mood normal.        Behavior: Behavior normal.        Thought Content: Thought content normal.        Judgment: Judgment normal.        ASSESSMENT & PLAN    Problem List Items Addressed This Visit       Cardiovascular and Mediastinum   Hot flashes    Patient says she has been experiencing severe hot flashes where she wakes up soaked.  This has been going on for about 3 months now.  She is wondering if we can test her estrogen level.  We had a discussion about hormone levels and I will go ahead and get [facility] and LH to see if we are in menopause.  And get estrogen level as requested by patient.        Relevant Orders   FSH   LH   Estrogens, total     Endocrine   Hypothyroidism due to Hashimoto's thyroiditis    Due to recent weight gain I want to add a TSH to make sure  thyroid levels are not off.      Relevant Orders   TSH     Other   Class 2 severe obesity due to excess calories with serious comorbidity and body mass index (BMI) of 38.0 to 38.9 in adult [facility])    Very pleasant 42 year old female presents today to discuss weight loss management.  Currently on Qsymia however patient says that she notices that it helps decrease her appetite however on today's visit her weight is increased 10 pounds since starting the medication.  At this point this is known as treatment failure and we will go ahead and suspend medication.  From her history she has also failed Contrave.  She also has a history of Saxenda failure where she kept losing and gaining 5 pounds.  At this point I have encouraged patient to reach back out to healthy weight and wellness to see with multiple treatment failures of Qsymia, Contrave, and Saxenda to see if she can get approved for GLP-1 such as Wegovy or Zepbound.  She does have serious comorbidity of anxiety as well as hyperlipidemia and vitamin D deficiency as well as depression. -Due to elevated weight gain I will go ahead and add an A1c to today's blood work to make sure we have not developed diabetes.      Relevant Orders   HgB A1c   GAD (generalized anxiety disorder) - Primary    Patient has been under a great deal of stress at home and work lately.  Recommended EAP therapy through [facility] health as well as giving patient prescription of BuSpar 15 mg that she can break off as needed.      Relevant Medications   busPIRone (BUSPAR) 15 MG tablet    Return Pending healthy weight and wellness referral.      Meds ordered this encounter  Medications   busPIRone (BUSPAR) 15 MG tablet    Sig: Take 1 tablet (15 mg total) by mouth 2 (two) times daily as needed (anxiety).    Dispense:  30 tablet    Refill:  0    Orders Placed This Encounter  Procedures   TSH   FSH   LH   Estrogens, total   HgB A1c     Charlton Amor, DO  [facility]  Primary Care & Sports Medicine at [facility] [phone] (phone) [phone] (fax)  [facility]

## 2023-06-17 NOTE — Assessment & Plan Note (Signed)
Patient has been under a great deal of stress at home and work lately.  Recommended EAP therapy through [facility] health as well as giving patient prescription of BuSpar 15 mg that she can break off as needed.

## 2023-06-17 NOTE — Assessment & Plan Note (Addendum)
Very pleasant 43 year old female presents today to discuss weight loss management.  Currently on Qsymia however patient says that she notices that it helps decrease her appetite however on today's visit her weight is increased 10 pounds since starting the medication.  At this point this is known as treatment failure and we will go ahead and suspend medication.  From her history she has also failed Contrave.  She also has a history of Saxenda failure where she kept losing and gaining 5 pounds.  At this point I have encouraged patient to reach back out to healthy weight and wellness to see with multiple treatment failures of Qsymia, Contrave, and Saxenda to see if she can get approved for GLP-1 such as Wegovy or Zepbound.  She does have serious comorbidity of anxiety as well as hyperlipidemia and vitamin D deficiency as well as depression. -Due to elevated weight gain I will go ahead and add an A1c to today's blood work to make sure we have not developed diabetes.

## 2023-06-17 NOTE — Assessment & Plan Note (Signed)
Patient says she has been experiencing severe hot flashes where she wakes up soaked.  This has been going on for about 3 months now.  She is wondering if we can test her estrogen level.  We had a discussion about hormone levels and I will go ahead and get [facility] and LH to see if we are in menopause.  And get estrogen level as requested by patient.

## 2023-06-17 NOTE — Patient Instructions (Addendum)
There is a EAP number through [facility] health   Request a Consultation Call [phone] to meet with a [facility] representative and develop an EACP plan tailored to your organization.  Call healthy weight and wellness

## 2023-06-22 ENCOUNTER — Other Ambulatory Visit: Payer: Self-pay | Admitting: Family Medicine

## 2023-06-22 ENCOUNTER — Encounter: Payer: Self-pay | Admitting: Family Medicine

## 2023-06-22 ENCOUNTER — Other Ambulatory Visit: Payer: Self-pay

## 2023-06-22 ENCOUNTER — Other Ambulatory Visit (HOSPITAL_COMMUNITY): Payer: Self-pay

## 2023-06-22 DIAGNOSIS — E039 Hypothyroidism, unspecified: Secondary | ICD-10-CM

## 2023-06-22 MED ORDER — LEVOTHYROXINE SODIUM 112 MCG PO TABS
112.0000 ug | ORAL_TABLET | Freq: Every day | ORAL | 3 refills | Status: DC
Start: 2023-06-22 — End: 2024-06-27
  Filled 2023-06-22: qty 90, 90d supply, fill #0
  Filled 2023-09-19: qty 90, 90d supply, fill #1
  Filled 2023-12-12: qty 90, 90d supply, fill #2
  Filled 2024-03-15: qty 30, 30d supply, fill #3
  Filled 2024-04-22: qty 60, 60d supply, fill #4

## 2023-06-23 NOTE — Telephone Encounter (Signed)
Left voice mail on patient phone number requesting a return call to help patient in scheduling.

## 2023-06-23 NOTE — Telephone Encounter (Signed)
Patient shows appt schld for 06/25/2023 for knee pain with Dr. Tamera Punt.

## 2023-06-25 ENCOUNTER — Ambulatory Visit (INDEPENDENT_AMBULATORY_CARE_PROVIDER_SITE_OTHER): Payer: Commercial Managed Care - PPO

## 2023-06-25 ENCOUNTER — Encounter: Payer: Self-pay | Admitting: Family Medicine

## 2023-06-25 ENCOUNTER — Other Ambulatory Visit (HOSPITAL_COMMUNITY): Payer: Self-pay

## 2023-06-25 ENCOUNTER — Ambulatory Visit: Payer: Commercial Managed Care - PPO | Admitting: Family Medicine

## 2023-06-25 VITALS — BP 119/81 | HR 76 | Resp 18 | Ht 64.0 in | Wt 238.8 lb

## 2023-06-25 DIAGNOSIS — M1711 Unilateral primary osteoarthritis, right knee: Secondary | ICD-10-CM | POA: Diagnosis not present

## 2023-06-25 DIAGNOSIS — S83206A Unspecified tear of unspecified meniscus, current injury, right knee, initial encounter: Secondary | ICD-10-CM | POA: Insufficient documentation

## 2023-06-25 DIAGNOSIS — M25561 Pain in right knee: Secondary | ICD-10-CM

## 2023-06-25 MED ORDER — CELECOXIB 200 MG PO CAPS
200.0000 mg | ORAL_CAPSULE | Freq: Every day | ORAL | 0 refills | Status: DC | PRN
  Filled 2023-06-25: qty 30, 30d supply, fill #0

## 2023-06-25 NOTE — Assessment & Plan Note (Addendum)
Based on exam we will go ahead and get a x-ray today I do believe she pulled something in her knee.  Recommended ice.  Also provided patient with a knee brace for stability purposes.  She will follow-up with Dr. Karie Schwalbe sports medicine doctor next week for further evaluation and diagnosis. -Have given Celebrex for pain control.  Patient is on Lexapro so we will not do meloxicam.

## 2023-06-25 NOTE — Progress Notes (Signed)
Acute Office Visit  Subjective:     Patient ID: Madison [name], female    DOB: 1981/01/24, 42 y.o.   MRN: [id number]  Chief Complaint  Patient presents with   Knee Pain    Right knee pain x1 month    Knee Pain    Patient is in today for acute R knee pain.  She was on her third 12-hour shift day and she was exercising and noticed a pulling sound.  She denies hearing any pop.  She is limping today.  She has tried ice and rest as well as elevation with no improvement of pain.  Review of Systems  Constitutional:  Negative for chills and fever.  Respiratory:  Negative for cough and shortness of breath.   Cardiovascular:  Negative for chest pain.  Musculoskeletal:        Knee pain  Neurological:  Negative for headaches.        Objective:    BP 119/81 (BP Location: Right Arm, Patient Position: Sitting, Cuff Size: Large)   Pulse 76   Resp 18   Ht 5\' 4"  (1.626 m)   Wt 238 lb 12 oz (108.3 kg)   SpO2 97%   BMI 40.98 kg/m    Physical Exam Vitals and nursing note reviewed.  Constitutional:      General: She is not in acute distress.    Appearance: Normal appearance.  HENT:     Head: Normocephalic and atraumatic.     Right Ear: External ear normal.     Left Ear: External ear normal.     Nose: Nose normal.  Eyes:     Conjunctiva/sclera: Conjunctivae normal.  Cardiovascular:     Rate and Rhythm: Normal rate and regular rhythm.  Pulmonary:     Effort: Pulmonary effort is normal.     Breath sounds: Normal breath sounds.  Musculoskeletal:     Comments: Right knee tenderness to palpation near patellar ligament/tibial tuberosity.  She also has tenderness to palpation of the medial knee.  Crepitus noted on exam with range of motion.  Negative McMurry test.  posterior drawer test elicits pain.  Negative anterior drawer.  Neurological:     General: No focal deficit present.     Mental Status: She is alert and oriented to person, place, and time.  Psychiatric:        Mood and  Affect: Mood normal.        Behavior: Behavior normal.        Thought Content: Thought content normal.        Judgment: Judgment normal.     No results found for any visits on 06/25/23.      Assessment & Plan:   Problem List Items Addressed This Visit       Other   Acute pain of right knee - Primary    Based on exam we will go ahead and get a x-ray today I do believe she pulled something in her knee.  Recommended ice.  Also provided patient with a knee brace for stability purposes.  She will follow-up with Dr. Karie Schwalbe sports medicine doctor next week for further evaluation and diagnosis. -Have given Celebrex for pain control.  Patient is on Lexapro so we will not do meloxicam.      Relevant Orders   DG Knee Complete 4 Views Right    Meds ordered this encounter  Medications   celecoxib (CELEBREX) 200 MG capsule    Sig: Take 1 capsule (200 mg total)  by mouth daily as needed.    Dispense:  30 capsule    Refill:  0    Return in about 1 week (around 07/02/2023) for with dr t .  Charlton Amor, DO

## 2023-07-01 ENCOUNTER — Encounter: Payer: Self-pay | Admitting: Sports Medicine

## 2023-07-01 ENCOUNTER — Ambulatory Visit: Payer: Commercial Managed Care - PPO | Admitting: Sports Medicine

## 2023-07-01 ENCOUNTER — Other Ambulatory Visit (HOSPITAL_COMMUNITY): Payer: Self-pay

## 2023-07-01 DIAGNOSIS — M25561 Pain in right knee: Secondary | ICD-10-CM | POA: Diagnosis not present

## 2023-07-01 MED ORDER — TRAMADOL HCL 50 MG PO TABS
50.0000 mg | ORAL_TABLET | Freq: Three times a day (TID) | ORAL | 0 refills | Status: DC | PRN
Start: 2023-07-01 — End: 2023-07-18
  Filled 2023-07-01: qty 21, 7d supply, fill #0

## 2023-07-01 NOTE — Assessment & Plan Note (Signed)
This is a very pleasant 42 year old female, she is a Engineer, civil (consulting), she is having increasing knee pain medial joint line with mechanical symptoms such as popping, catching, locking. She saw her PCP, she was given Celebrex which is helping. Today on exam she does have a mild effusion, she has tenderness to the medial joint line, she has a positive McMurray's sign and pain with terminal flexion all concerning for a meniscal tear, due to her age and the acuity of the situation as well as mechanical symptoms we will proceed with MRI of her right knee, adding tramadol for breakthrough pain. Return to see me for MRI results and will consider a knee injection at that point.

## 2023-07-01 NOTE — Progress Notes (Signed)
    Procedures performed today:    None.  Independent interpretation of notes and tests performed by another provider:   None.  Brief History, Exam, Impression, and Recommendations:    Acute pain of right knee This is a very pleasant 42 year old female, she is a Engineer, civil (consulting), she is having increasing knee pain medial joint line with mechanical symptoms such as popping, catching, locking. She saw her PCP, she was given Celebrex which is helping. Today on exam she does have a mild effusion, she has tenderness to the medial joint line, she has a positive McMurray's sign and pain with terminal flexion all concerning for a meniscal tear, due to her age and the acuity of the situation as well as mechanical symptoms we will proceed with MRI of her right knee, adding tramadol for breakthrough pain. Return to see me for MRI results and will consider a knee injection at that point.    ____________________________________________ Ihor Austin. Benjamin Stain, M.D., ABFM., CAQSM., AME. Primary Care and Sports Medicine [facility]  Adjunct Professor of Family Medicine  [place] [employer]  Restaurant manager, fast food

## 2023-07-02 ENCOUNTER — Other Ambulatory Visit (HOSPITAL_COMMUNITY): Payer: Self-pay

## 2023-07-06 ENCOUNTER — Ambulatory Visit: Payer: Commercial Managed Care - PPO | Admitting: Bariatrics

## 2023-07-06 ENCOUNTER — Encounter: Payer: Self-pay | Admitting: Bariatrics

## 2023-07-06 VITALS — BP 127/86 | HR 74 | Temp 97.6°F | Ht 64.5 in | Wt 231.0 lb

## 2023-07-06 DIAGNOSIS — Z6839 Body mass index (BMI) 39.0-39.9, adult: Secondary | ICD-10-CM

## 2023-07-06 DIAGNOSIS — R0602 Shortness of breath: Secondary | ICD-10-CM

## 2023-07-06 DIAGNOSIS — F32A Depression, unspecified: Secondary | ICD-10-CM | POA: Diagnosis not present

## 2023-07-06 DIAGNOSIS — R5383 Other fatigue: Secondary | ICD-10-CM | POA: Diagnosis not present

## 2023-07-06 DIAGNOSIS — E669 Obesity, unspecified: Secondary | ICD-10-CM

## 2023-07-06 DIAGNOSIS — E038 Other specified hypothyroidism: Secondary | ICD-10-CM

## 2023-07-06 DIAGNOSIS — E063 Autoimmune thyroiditis: Secondary | ICD-10-CM | POA: Diagnosis not present

## 2023-07-06 DIAGNOSIS — E559 Vitamin D deficiency, unspecified: Secondary | ICD-10-CM | POA: Diagnosis not present

## 2023-07-06 DIAGNOSIS — I471 Supraventricular tachycardia, unspecified: Secondary | ICD-10-CM | POA: Diagnosis not present

## 2023-07-06 DIAGNOSIS — Z1331 Encounter for screening for depression: Secondary | ICD-10-CM

## 2023-07-06 DIAGNOSIS — F5089 Other specified eating disorder: Secondary | ICD-10-CM | POA: Diagnosis not present

## 2023-07-06 DIAGNOSIS — F509 Eating disorder, unspecified: Secondary | ICD-10-CM | POA: Insufficient documentation

## 2023-07-06 DIAGNOSIS — R7309 Other abnormal glucose: Secondary | ICD-10-CM | POA: Diagnosis not present

## 2023-07-07 LAB — LIPID PANEL WITH LDL/HDL RATIO
Cholesterol, Total: 283 mg/dL — ABNORMAL HIGH (ref 100–199)
HDL: 81 mg/dL (ref >39)
LDL Chol Calc (NIH): 180 mg/dL — ABNORMAL HIGH (ref 0–99)
LDL/HDL Ratio: 2.2 ratio (ref 0.0–3.2)
Triglycerides: 126 mg/dL (ref 0–149)
VLDL Cholesterol Cal: 22 mg/dL (ref 5–40)

## 2023-07-07 LAB — COMPREHENSIVE METABOLIC PANEL
ALT: 14 IU/L (ref 0–32)
AST: 18 IU/L (ref 0–40)
Albumin: 4.3 g/dL (ref 3.9–4.9)
Alkaline Phosphatase: 70 IU/L (ref 44–121)
BUN/Creatinine Ratio: 22 (ref 9–23)
BUN: 19 mg/dL (ref 6–24)
Bilirubin Total: 0.4 mg/dL (ref 0.0–1.2)
CO2: 22 mmol/L (ref 20–29)
Calcium: 9.5 mg/dL (ref 8.7–10.2)
Chloride: 101 mmol/L (ref 96–106)
Creatinine, Ser: 0.87 mg/dL (ref 0.57–1.00)
Globulin, Total: 2.8 g/dL (ref 1.5–4.5)
Glucose: 92 mg/dL (ref 70–99)
Potassium: 4.7 mmol/L (ref 3.5–5.2)
Sodium: 139 mmol/L (ref 134–144)
Total Protein: 7.1 g/dL (ref 6.0–8.5)
eGFR: 86 mL/min/{1.73_m2} (ref >59)

## 2023-07-07 LAB — INSULIN, RANDOM: INSULIN: 9.9 u[IU]/mL (ref 2.6–24.9)

## 2023-07-08 ENCOUNTER — Other Ambulatory Visit (HOSPITAL_COMMUNITY): Payer: Self-pay

## 2023-07-08 ENCOUNTER — Telehealth: Payer: Commercial Managed Care - PPO | Admitting: Physician Assistant

## 2023-07-08 DIAGNOSIS — T7840XA Allergy, unspecified, initial encounter: Secondary | ICD-10-CM

## 2023-07-08 MED ORDER — PREDNISONE 10 MG (21) PO TBPK
ORAL_TABLET | ORAL | 0 refills | Status: DC
Start: 2023-07-08 — End: 2023-07-26
  Filled 2023-07-08: qty 21, 6d supply, fill #0

## 2023-07-08 NOTE — Progress Notes (Signed)
I have spent 5 minutes in review of e-visit questionnaire, review and updating patient chart, medical decision making and response to patient.   William Cody Martin, PA-C    

## 2023-07-08 NOTE — Progress Notes (Signed)
E Visit for Rash  We are sorry that you are not feeling well. Here is how we plan to help!  Giving concern for allergic reaction I have prescribed a steroid pack for you to take as directed. Ok to continue OTC benadryl as needed. Cool compresses can also help with itching. IF not substantially improving within 24-48 hours or anything continuing to progress, you need an in-person evaluation ASAP.   HOME CARE:  Take cool showers and avoid direct sunlight. Apply cool compress or wet dressings. Take a bath in an oatmeal bath.  Sprinkle content of one Aveeno packet under running faucet with comfortably warm water.  Bathe for 15-20 minutes, 1-2 times daily.  Pat dry with a towel. Do not rub the rash. Use hydrocortisone cream. Take an antihistamine like Benadryl for widespread rashes that itch.  The adult dose of Benadryl is 25-50 mg by mouth 4 times daily. Caution:  This type of medication may cause sleepiness.  Do not drink alcohol, drive, or operate dangerous machinery while taking antihistamines.  Do not take these medications if you have prostate enlargement.  Read package instructions thoroughly on all medications that you take.  GET HELP RIGHT AWAY IF:  Symptoms don't go away after treatment. Severe itching that persists. If you rash spreads or swells. If you rash begins to smell. If it blisters and opens or develops a yellow-brown crust. You develop a fever. You have a sore throat. You become short of breath.  MAKE SURE YOU:  Understand these instructions. Will watch your condition. Will get help right away if you are not doing well or get worse.  Thank you for choosing an e-visit.  Your e-visit answers were reviewed by a board certified advanced clinical practitioner to complete your personal care plan. Depending upon the condition, your plan could have included both over the counter or prescription medications.  Please review your pharmacy choice. Make sure the pharmacy is  open so you can pick up prescription now. If there is a problem, you may contact your provider through [employer] and have the prescription routed to another pharmacy.  Your safety is important to [place]. If you have drug allergies check your prescription carefully.   For the next 24 hours you can use MyChart to ask questions about today's visit, request a non-urgent call back, or ask for a work or school excuse. You will get an email in the next two days asking about your experience. I hope that your e-visit has been valuable and will speed your recovery.

## 2023-07-10 ENCOUNTER — Ambulatory Visit (INDEPENDENT_AMBULATORY_CARE_PROVIDER_SITE_OTHER): Payer: Commercial Managed Care - PPO

## 2023-07-10 DIAGNOSIS — M25561 Pain in right knee: Secondary | ICD-10-CM | POA: Diagnosis not present

## 2023-07-10 DIAGNOSIS — M25461 Effusion, right knee: Secondary | ICD-10-CM | POA: Diagnosis not present

## 2023-07-10 DIAGNOSIS — S83231A Complex tear of medial meniscus, current injury, right knee, initial encounter: Secondary | ICD-10-CM | POA: Diagnosis not present

## 2023-07-13 NOTE — Progress Notes (Signed)
Chief Complaint:   OBESITY Madison [name] (MR# [id number]) is a 42 y.o. female who presents for evaluation and treatment of obesity and related comorbidities. Current BMI is Body mass index is 39.04 kg/m. Madison [name] has been struggling with her weight for many years and has been unsuccessful in either losing weight, maintaining weight loss, or reaching her healthy weight goal.  Madison [name] is currently in the action stage of change and ready to dedicate time achieving and maintaining a healthier weight. Madison [name] is interested in becoming our patient and working on intensive lifestyle modifications including (but not limited to) diet and exercise for weight loss.  Patient is here for her initial visit.  She was at [facility] with Dr. Lawson Radar in 2020.  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  Depression Screen Danetta's Food and Mood (modified PHQ-9) score was 11.  Subjective:   1. Other fatigue Madison [name] admits to daytime somnolence and admits to waking up still tired. Patient has a history of symptoms of daytime fatigue, morning fatigue, and morning headache. Madison [name] generally gets 6 or 7 hours of sleep per night, and states that she has nightime awakenings. Snoring is present. Apneic episodes are not present. Epworth Sleepiness Score is 6.   2. SOB (shortness of breath) on exertion Madison [name] notes increasing shortness of breath with exercising and seems to be worsening over time with weight gain. She notes getting out of breath sooner with activity than she used to. This has not gotten worse recently. Madison [name] denies shortness of breath at rest or  orthopnea.  3. Hypothyroidism due to Hashimoto's thyroiditis Patient was diagnosed 10 years ago, and she is taking levothyroxine.  4. SVT (supraventricular tachycardia) Patient is taking diltiazem.  5. Vitamin D deficiency Patient is taking calcium plus vitamin D.  Vitamin D level was normal with her PCP.  6. Other disorder of eating Patient notes food noise.  Assessment/Plan:   1. Other fatigue Madison [name] does feel that her weight is causing her energy to be lower than it should be. Fatigue may be related to obesity, depression or many other causes. Labs will be ordered, and in the meanwhile, Madison [name] will focus on self care including making healthy food choices, increasing physical activity and focusing on stress reduction.  2. SOB (shortness of breath) on exertion Madison [name] does feel that she gets out of breath more easily that she used to when she exercises. Madison [name]'s shortness of breath appears to be obesity related and exercise induced. She has agreed to work on weight loss and gradually increase exercise to treat her exercise induced shortness of breath. Will continue to monitor closely.  - Insulin, random - Lipid Panel With LDL/HDL Ratio - Comprehensive metabolic panel  3. Hypothyroidism due to Hashimoto's thyroiditis We will check labs today.  Patient will continue levothyroxine, and we will follow-up at her next visit.  - Insulin, random - Lipid Panel With LDL/HDL Ratio - Comprehensive metabolic panel  4. SVT (supraventricular tachycardia) Patient will continue to follow-up with her Cardiologist, and will continue with her medications.  5. Vitamin D deficiency Patient will continue calcium plus vitamin D.  6. Other disorder of eating Patient  was referred to Dr. Dewaine Conger, our Bariatric Psychologist, for evaluation due to her elevated PHQ-9 score and significant struggles with emotional eating.  7. Depression screening Madison [name] had a positive depression screening. Depression is  commonly associated with obesity and often results in emotional eating behaviors. We will monitor this closely and work on CBT to help improve the non-hunger eating patterns. Referral to Psychology may be required if no improvement is seen as she continues in our clinic.  8. Morbid obesity (HCC) We will check labs today, and we will follow-up at patient's next visit.   - Insulin, random - Lipid Panel With LDL/HDL Ratio - Comprehensive metabolic panel  9. Obesity, Class II, BMI 35-39.9 Madison [name] is currently in the action stage of change and her goal is to continue with weight loss efforts. I recommend Madison [name] begin the structured treatment plan as follows:  She has agreed to keeping a food journal and adhering to recommended goals of 1200-1300 calories and 80-90 grams of protein daily.  Reviewed labs with the patient from 01/27/2023, vitamin B-12 and vitamin D, 06/17/2023, TSH, A1c, and glucose.  She had been on Saxenda, Contrave, Qsymia, and Rybelsus.  Exercise goals: No exercise has been prescribed at this time.   Behavioral modification strategies: increasing lean protein intake, decreasing simple carbohydrates, increasing vegetables, increasing water intake, decreasing eating out, no skipping meals, meal planning and cooking strategies, keeping healthy foods in the home, and planning for success.  She was informed of the importance of frequent follow-up visits to maximize her success with intensive lifestyle modifications for her multiple health conditions. She was informed we would discuss her lab results at her next visit unless there is a critical issue that needs to be addressed sooner. Madison [name] agreed to keep her next visit at the agreed upon time to discuss these results.  Objective:   Blood pressure 127/86, pulse 74, temperature 97.6 F (36.4 C), height 5' 4.5" (1.638 m), weight 231 lb (104.8 kg), SpO2 97%. Body mass index is 39.04 kg/m.  EKG: Normal sinus rhythm, rate 82 BPM.  Indirect  Calorimeter completed today shows a VO2 of 271 and a REE of 1872.  Her calculated basal metabolic rate is 4098 thus her basal metabolic rate is better than expected.  General: Cooperative, alert, well developed, in no acute distress. HEENT: Conjunctivae and lids unremarkable. Cardiovascular: Regular rhythm.  Lungs: Normal work of breathing. Neurologic: No focal deficits.   Lab Results  Component Value Date   CREATININE 0.87 07/06/2023   BUN 19 07/06/2023   NA 139 07/06/2023   K 4.7 07/06/2023   CL 101 07/06/2023   CO2 22 07/06/2023   Lab Results  Component Value Date   ALT 14 07/06/2023   AST 18 07/06/2023   ALKPHOS 70 07/06/2023   BILITOT 0.4 07/06/2023   Lab Results  Component Value Date   HGBA1C 5.6 06/17/2023   HGBA1C 5.4 08/04/2022   HGBA1C 5.4 04/13/2019   HGBA1C 5.2 08/24/2018   Lab Results  Component Value Date   INSULIN 9.9 07/06/2023   INSULIN 9.9 04/13/2019   INSULIN 9.6 12/14/2018   INSULIN 6.2 08/24/2018   Lab Results  Component Value Date   TSH 1.910 06/17/2023   Lab Results  Component Value Date   CHOL 283 (H) 07/06/2023   HDL 81 07/06/2023   LDLCALC 180 (H) 07/06/2023   TRIG 126 07/06/2023   CHOLHDL 3.5 06/26/2022   Lab Results  Component Value Date   WBC 7.3 08/24/2018   HGB  13.8 08/24/2018   HCT 40.3 08/24/2018   MCV 92 08/24/2018   No results found for: "IRON", "TIBC", "FERRITIN"  Attestation Statements:   Reviewed by clinician on day of visit: allergies, medications, problem list, medical history, surgical history, family history, social history, and previous encounter notes.   Trude Mcburney, am acting as Energy manager for [employer], DO.  I have reviewed the above documentation for accuracy and completeness, and I agree with the above. Corinna Capra, DO

## 2023-07-15 ENCOUNTER — Encounter: Payer: Self-pay | Admitting: Family Medicine

## 2023-07-16 ENCOUNTER — Encounter: Payer: Self-pay | Admitting: Sports Medicine

## 2023-07-18 ENCOUNTER — Other Ambulatory Visit: Payer: Self-pay | Admitting: Sports Medicine

## 2023-07-18 ENCOUNTER — Other Ambulatory Visit: Payer: Self-pay | Admitting: Family Medicine

## 2023-07-18 DIAGNOSIS — M25561 Pain in right knee: Secondary | ICD-10-CM

## 2023-07-19 ENCOUNTER — Other Ambulatory Visit (HOSPITAL_COMMUNITY): Payer: Self-pay

## 2023-07-19 MED ORDER — ESCITALOPRAM OXALATE 10 MG PO TABS
10.0000 mg | ORAL_TABLET | Freq: Every day | ORAL | 3 refills | Status: DC
  Filled 2023-07-19: qty 90, 90d supply, fill #0
  Filled 2023-10-14: qty 90, 90d supply, fill #1
  Filled 2024-01-21: qty 90, 90d supply, fill #2
  Filled 2024-03-15 – 2024-04-22 (×2): qty 90, 90d supply, fill #3

## 2023-07-19 MED ORDER — TRAMADOL HCL 50 MG PO TABS
50.0000 mg | ORAL_TABLET | Freq: Three times a day (TID) | ORAL | 0 refills | Status: DC | PRN
Start: 2023-07-19 — End: 2023-08-15
  Filled 2023-07-19: qty 30, 10d supply, fill #0

## 2023-07-20 ENCOUNTER — Ambulatory Visit: Payer: Commercial Managed Care - PPO | Admitting: Sports Medicine

## 2023-07-20 ENCOUNTER — Encounter: Payer: Self-pay | Admitting: Sports Medicine

## 2023-07-20 ENCOUNTER — Other Ambulatory Visit (INDEPENDENT_AMBULATORY_CARE_PROVIDER_SITE_OTHER): Payer: Commercial Managed Care - PPO

## 2023-07-20 ENCOUNTER — Other Ambulatory Visit (HOSPITAL_COMMUNITY): Payer: Self-pay

## 2023-07-20 DIAGNOSIS — M25561 Pain in right knee: Secondary | ICD-10-CM

## 2023-07-20 MED ORDER — TRIAMCINOLONE ACETONIDE 40 MG/ML IJ SUSP
40.0000 mg | Freq: Once | INTRAMUSCULAR | Status: AC
Start: 2023-07-20 — End: 2023-07-20
  Administered 2023-07-20: 40 mg via INTRAMUSCULAR

## 2023-07-20 NOTE — Progress Notes (Signed)
    Procedures performed today:    Procedure: Real-time Ultrasound Guided injection of the right knee Device: Samsung HS60  Verbal informed consent obtained.  Time-out conducted.  Noted no overlying erythema, induration, or other signs of local infection.  Skin prepped in a sterile fashion.  Local anesthesia: Topical Ethyl chloride.  With sterile technique and under real time ultrasound guidance: Mild effusion noted, 1 cc Kenalog 40, 2 cc lidocaine, 2 cc bupivacaine injected easily Completed without difficulty  Advised to call if fevers/chills, erythema, induration, drainage, or persistent bleeding.  Images permanently stored and available for review in PACS.  Impression: Technically successful ultrasound guided injection.  Independent interpretation of notes and tests performed by another provider:   None.  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 Will do an injection, if insufficient response we will consider referral to Dr. Everardo Pacific.    ____________________________________________ Ihor Austin. Benjamin Stain, M.D., ABFM., CAQSM., AME. Primary Care and Sports Medicine [facility]  Adjunct Professor of Family Medicine  [employer]  Restaurant manager, fast food

## 2023-07-20 NOTE — Addendum Note (Signed)
Addended by: Carren Rang A on: 07/20/2023 04:49 PM   Modules accepted: Orders

## 2023-07-20 NOTE — Assessment & Plan Note (Signed)
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 Will do an injection, if insufficient response we will consider referral to Dr. Everardo Pacific.

## 2023-07-23 ENCOUNTER — Other Ambulatory Visit (HOSPITAL_COMMUNITY): Payer: Self-pay

## 2023-07-24 ENCOUNTER — Encounter: Payer: Self-pay | Admitting: Family Medicine

## 2023-07-24 ENCOUNTER — Encounter: Payer: Self-pay | Admitting: Sports Medicine

## 2023-07-26 ENCOUNTER — Other Ambulatory Visit: Payer: Self-pay | Admitting: Family Medicine

## 2023-07-26 ENCOUNTER — Other Ambulatory Visit (HOSPITAL_COMMUNITY): Payer: Self-pay

## 2023-07-26 DIAGNOSIS — Z308 Encounter for other contraceptive management: Secondary | ICD-10-CM

## 2023-07-26 MED ORDER — CELECOXIB 200 MG PO CAPS
200.0000 mg | ORAL_CAPSULE | Freq: Every day | ORAL | 3 refills | Status: DC | PRN
  Filled 2023-07-26: qty 180, 90d supply, fill #0

## 2023-07-26 MED ORDER — NORGESTIM-ETH ESTRAD TRIPHASIC 0.18/0.215/0.25 MG-35 MCG PO TABS
1.0000 | ORAL_TABLET | Freq: Every day | ORAL | 11 refills | Status: DC
Start: 2023-07-26 — End: 2024-05-29
  Filled 2023-07-26: qty 84, 84d supply, fill #0
  Filled 2023-10-10: qty 84, 84d supply, fill #1
  Filled 2024-01-05: qty 84, 84d supply, fill #2
  Filled 2024-03-15 – 2024-03-16 (×2): qty 84, 84d supply, fill #3

## 2023-07-29 ENCOUNTER — Ambulatory Visit: Payer: Commercial Managed Care - PPO | Admitting: Bariatrics

## 2023-08-03 ENCOUNTER — Encounter (INDEPENDENT_AMBULATORY_CARE_PROVIDER_SITE_OTHER): Payer: Self-pay

## 2023-08-03 ENCOUNTER — Encounter (INDEPENDENT_AMBULATORY_CARE_PROVIDER_SITE_OTHER): Payer: Commercial Managed Care - PPO | Admitting: Sports Medicine

## 2023-08-03 ENCOUNTER — Telehealth (INDEPENDENT_AMBULATORY_CARE_PROVIDER_SITE_OTHER): Payer: Self-pay | Admitting: Psychology

## 2023-08-03 DIAGNOSIS — M25561 Pain in right knee: Secondary | ICD-10-CM | POA: Diagnosis not present

## 2023-08-04 NOTE — Telephone Encounter (Signed)

## 2023-08-11 ENCOUNTER — Other Ambulatory Visit: Payer: Self-pay | Admitting: Cardiology

## 2023-08-11 ENCOUNTER — Other Ambulatory Visit (HOSPITAL_COMMUNITY): Payer: Self-pay

## 2023-08-11 MED ORDER — DILTIAZEM HCL ER COATED BEADS 300 MG PO CP24
300.0000 mg | ORAL_CAPSULE | Freq: Every day | ORAL | 0 refills | Status: DC
  Filled 2023-08-11: qty 90, 90d supply, fill #0

## 2023-08-15 ENCOUNTER — Other Ambulatory Visit: Payer: Self-pay | Admitting: Sports Medicine

## 2023-08-15 DIAGNOSIS — M25561 Pain in right knee: Secondary | ICD-10-CM

## 2023-08-16 ENCOUNTER — Other Ambulatory Visit (HOSPITAL_COMMUNITY): Payer: Self-pay

## 2023-08-16 MED ORDER — TRAMADOL HCL 50 MG PO TABS
50.0000 mg | ORAL_TABLET | Freq: Three times a day (TID) | ORAL | 0 refills | Status: DC | PRN
Start: 2023-08-16 — End: 2023-09-19
  Filled 2023-08-16: qty 30, 10d supply, fill #0

## 2023-08-18 ENCOUNTER — Other Ambulatory Visit: Payer: Self-pay | Admitting: Family Medicine

## 2023-08-18 ENCOUNTER — Other Ambulatory Visit: Payer: Self-pay

## 2023-08-18 DIAGNOSIS — Z1231 Encounter for screening mammogram for malignant neoplasm of breast: Secondary | ICD-10-CM

## 2023-08-19 ENCOUNTER — Other Ambulatory Visit (HOSPITAL_COMMUNITY): Payer: Self-pay

## 2023-08-25 DIAGNOSIS — Z713 Dietary counseling and surveillance: Secondary | ICD-10-CM | POA: Diagnosis not present

## 2023-08-25 DIAGNOSIS — Z724 Inappropriate diet and eating habits: Secondary | ICD-10-CM | POA: Diagnosis not present

## 2023-08-31 ENCOUNTER — Other Ambulatory Visit (HOSPITAL_COMMUNITY): Payer: Self-pay

## 2023-08-31 ENCOUNTER — Ambulatory Visit: Payer: Commercial Managed Care - PPO | Admitting: Sports Medicine

## 2023-08-31 ENCOUNTER — Encounter: Payer: Self-pay | Admitting: Sports Medicine

## 2023-08-31 DIAGNOSIS — S83203D Other tear of unspecified meniscus, current injury, right knee, subsequent encounter: Secondary | ICD-10-CM

## 2023-08-31 MED ORDER — ACETAMINOPHEN ER 650 MG PO TBCR
650.0000 mg | EXTENDED_RELEASE_TABLET | Freq: Every day | ORAL | Status: DC
Start: 2023-08-31 — End: 2023-11-25

## 2023-08-31 MED ORDER — CYCLOBENZAPRINE HCL 10 MG PO TABS
5.0000 mg | ORAL_TABLET | Freq: Every day | ORAL | 11 refills | Status: AC
Start: 2023-08-31 — End: ?
  Filled 2023-08-31: qty 30, 30d supply, fill #0
  Filled 2023-10-01: qty 30, 30d supply, fill #1
  Filled 2023-10-27: qty 30, 30d supply, fill #2
  Filled 2023-11-26: qty 30, 30d supply, fill #3
  Filled 2024-01-05: qty 30, 30d supply, fill #4
  Filled 2024-02-12: qty 30, 30d supply, fill #5
  Filled 2024-03-15: qty 30, 30d supply, fill #6
  Filled 2024-04-22: qty 30, 30d supply, fill #7
  Filled 2024-05-26: qty 30, 30d supply, fill #8
  Filled 2024-06-27: qty 30, 30d supply, fill #9
  Filled 2024-08-05: qty 30, 30d supply, fill #10

## 2023-08-31 NOTE — Progress Notes (Signed)
    Procedures performed today:    None.  Independent interpretation of notes and tests performed by another provider:   None.  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 This had occurred fairly acutely. She was referred to me and due to an effusion, as well as subjective mechanical symptoms we injected her knee and obtained an MRI. The MRI did show a complex meniscal tear. She returns today, unfortunately the injection has not helped with significant duration, we are going to get her set up with Dr. Everardo Pacific for discussion of operative intervention. In the meantime she will continue tramadol at night, Celebrex, she still having significant nocturnal pain so we will add acetaminophen 650 and some Flexeril at night to help keep her sleep.  Chronic process with exacerbation and pharmacologic intervention  ____________________________________________ Ihor Austin. Benjamin Stain, M.D., ABFM., CAQSM., AME. Primary Care and Sports Medicine [facility]  Adjunct Professor of Family Medicine  [employer]  Restaurant manager, fast food

## 2023-08-31 NOTE — Assessment & Plan Note (Signed)
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 This had occurred fairly acutely. She was referred to me and due to an effusion, as well as subjective mechanical symptoms we injected her knee and obtained an MRI. The MRI did show a complex meniscal tear. She returns today, unfortunately the injection has not helped with significant duration, we are going to get her set up with Dr. Everardo Pacific for discussion of operative intervention. In the meantime she will continue tramadol at night, Celebrex, she still having significant nocturnal pain so we will add acetaminophen 650 and some Flexeril at night to help keep her sleep.

## 2023-09-01 ENCOUNTER — Encounter: Payer: Self-pay | Admitting: Sports Medicine

## 2023-09-01 ENCOUNTER — Other Ambulatory Visit (HOSPITAL_COMMUNITY): Payer: Self-pay

## 2023-09-01 DIAGNOSIS — Z713 Dietary counseling and surveillance: Secondary | ICD-10-CM | POA: Diagnosis not present

## 2023-09-01 DIAGNOSIS — Z724 Inappropriate diet and eating habits: Secondary | ICD-10-CM | POA: Diagnosis not present

## 2023-09-09 DIAGNOSIS — M25561 Pain in right knee: Secondary | ICD-10-CM | POA: Diagnosis not present

## 2023-09-10 ENCOUNTER — Encounter: Payer: Self-pay | Admitting: Cardiology

## 2023-09-10 NOTE — Telephone Encounter (Signed)
FYI: Pt does NOT need OV/tele visit for clearance -- per Dr. Elberta Fortis.  He will clear her w/o visit.

## 2023-09-14 ENCOUNTER — Ambulatory Visit
Admission: RE | Admit: 2023-09-14 | Discharge: 2023-09-14 | Disposition: A | Discharge status: Home / Self Care | Payer: Commercial Managed Care - PPO | Source: Ambulatory Visit | Attending: Family Medicine

## 2023-09-14 ENCOUNTER — Telehealth: Payer: Self-pay | Admitting: *Deleted

## 2023-09-14 DIAGNOSIS — Z1231 Encounter for screening mammogram for malignant neoplasm of breast: Secondary | ICD-10-CM | POA: Diagnosis not present

## 2023-09-14 NOTE — Telephone Encounter (Signed)
   Pre-operative Risk Assessment    Patient Name: Madison [name]  DOB: 18-Aug-1981 MRN: [id number]    DATE OF LAST VISIT: 11/11/22 DR. CAMNITZ DATE OF NEXT VISIT: NONE  Request for Surgical Clearance    Procedure:   RIGHT KNEE SCOPE  Date of Surgery:  Clearance TBD                                 Surgeon:  DR. Ramond Marrow Surgeon's Group or Practice Name:  [facility] Phone number:  [phone] EXT [phone] ATTN: Silvestre Mesi Fax number:  [phone]   Type of Clearance Requested:   - Medical ; NONE INDICATED TO BE HELD   Type of Anesthesia:  General    Additional requests/questions:    Madison [name]   09/14/2023, 11:50 AM

## 2023-09-15 ENCOUNTER — Telehealth: Payer: Self-pay | Admitting: *Deleted

## 2023-09-15 ENCOUNTER — Telehealth: Payer: Self-pay

## 2023-09-15 NOTE — Telephone Encounter (Signed)
   Name: Madison [name]  DOB: 1980-11-13  MRN: [id number]  Primary Cardiologist: Will Jorja Loa, MD   Preoperative team, please contact this patient and set up a phone call appointment for further preoperative risk assessment. Please obtain consent and complete medication review. Thank you for your help.  I confirm that guidance regarding antiplatelet and oral anticoagulation therapy has been completed and, if necessary, noted below.  No medications to hold  I also confirmed the patient resides in the state of [place]. As per [facility] Medical Board telemedicine laws, the patient must reside in the state in which the provider is licensed.   Napoleon Form, Leodis Rains, NP 09/15/2023, 8:51 AM [facility]

## 2023-09-15 NOTE — Telephone Encounter (Signed)
Pt has been scheduled tele pre op appt 09/23/23. Med rec and consent are done.     Patient Consent for Virtual Visit        Madison [name] has provided verbal consent on 09/15/2023 for a virtual visit (video or telephone).   CONSENT FOR VIRTUAL VISIT FOR:  Madison [name]  By participating in this virtual visit I agree to the following:  I hereby voluntarily request, consent and authorize [facility] and its employed or contracted physicians, physician assistants, nurse practitioners or other licensed health care professionals (the Practitioner), to provide me with telemedicine health care services (the "Services") as deemed necessary by the treating Practitioner. I acknowledge and consent to receive the Services by the Practitioner via telemedicine. I understand that the telemedicine visit will involve communicating with the Practitioner through live audiovisual communication technology and the disclosure of certain medical information by electronic transmission. I acknowledge that I have been given the opportunity to request an in-person assessment or other available alternative prior to the telemedicine visit and am voluntarily participating in the telemedicine visit.  I understand that I have the right to withhold or withdraw my consent to the use of telemedicine in the course of my care at any time, without affecting my right to future care or treatment, and that the Practitioner or I may terminate the telemedicine visit at any time. I understand that I have the right to inspect all information obtained and/or recorded in the course of the telemedicine visit and may receive copies of available information for a reasonable fee.  I understand that some of the potential risks of receiving the Services via telemedicine include:  Delay or interruption in medical evaluation due to technological equipment failure or disruption; Information transmitted may not be sufficient (e.g. poor resolution  of images) to allow for appropriate medical decision making by the Practitioner; and/or  In rare instances, security protocols could fail, causing a breach of personal health information.  Furthermore, I acknowledge that it is my responsibility to provide information about my medical history, conditions and care that is complete and accurate to the best of my ability. I acknowledge that Practitioner's advice, recommendations, and/or decision may be based on factors not within their control, such as incomplete or inaccurate data provided by me or distortions of diagnostic images or specimens that may result from electronic transmissions. I understand that the practice of medicine is not an exact science and that Practitioner makes no warranties or guarantees regarding treatment outcomes. I acknowledge that a copy of this consent can be made available to me via my patient portal [facility] MyChart), or I can request a printed copy by calling the office of [facility].    I understand that my insurance will be billed for this visit.   I have read or had this consent read to me. I understand the contents of this consent, which adequately explains the benefits and risks of the Services being provided via telemedicine.  I have been provided ample opportunity to ask questions regarding this consent and the Services and have had my questions answered to my satisfaction. I give my informed consent for the services to be provided through the use of telemedicine in my medical care

## 2023-09-15 NOTE — Telephone Encounter (Signed)
I called to schedule patient with Dr Tamera Punt for a surgical clearance. She states she will call back once she has her schedule.

## 2023-09-15 NOTE — Telephone Encounter (Signed)
Pt has been scheduled tele pre op appt 09/23/23. Med rec and consent are done.

## 2023-09-15 NOTE — Telephone Encounter (Signed)
Left message to call back to set up tele pre op appt.  

## 2023-09-17 ENCOUNTER — Encounter: Payer: Self-pay | Admitting: Family Medicine

## 2023-09-19 ENCOUNTER — Other Ambulatory Visit: Payer: Self-pay | Admitting: Sports Medicine

## 2023-09-19 DIAGNOSIS — M25561 Pain in right knee: Secondary | ICD-10-CM

## 2023-09-19 MED ORDER — TRAMADOL HCL 50 MG PO TABS
50.0000 mg | ORAL_TABLET | Freq: Three times a day (TID) | ORAL | 0 refills | Status: DC | PRN
Start: 2023-09-19 — End: 2023-10-21
  Filled 2023-09-19: qty 30, 10d supply, fill #0

## 2023-09-20 ENCOUNTER — Other Ambulatory Visit (HOSPITAL_COMMUNITY): Payer: Self-pay

## 2023-09-20 ENCOUNTER — Other Ambulatory Visit: Payer: Self-pay

## 2023-09-21 ENCOUNTER — Ambulatory Visit: Payer: Commercial Managed Care - PPO | Admitting: Family Medicine

## 2023-09-23 ENCOUNTER — Ambulatory Visit: Payer: Commercial Managed Care - PPO | Attending: Cardiology | Admitting: Nurse Practitioner

## 2023-09-23 DIAGNOSIS — Z01810 Encounter for preprocedural cardiovascular examination: Secondary | ICD-10-CM | POA: Diagnosis not present

## 2023-09-23 NOTE — Progress Notes (Signed)
Virtual Visit via Telephone Note   Because of Madison [name]'s co-morbid illnesses, she is at least at moderate risk for complications without adequate follow up.  This format is felt to be most appropriate for this patient at this time.  The patient did not have access to video technology/had technical difficulties with video requiring transitioning to audio format only (telephone).  All issues noted in this document were discussed and addressed.  No physical exam could be performed with this format.  Please refer to the patient's chart for her consent to telehealth for [facility].  Evaluation Performed:  Preoperative cardiovascular risk assessment _____________   Date:  09/23/2023   Patient ID:  Madison [name], DOB 1980-11-27, MRN [id number] Patient Location:  Home Provider location:   Office  Primary Care Provider:  Charlton Amor, DO Primary Cardiologist:  Will Jorja Loa, MD  Chief Complaint / Patient Profile   42 y.o. y/o female with a h/o PSVT, hyperlipidemia, and hypothyroidism who is pending R knee scope with Dr. Ramond Marrow of [facility] and presents today for telephonic preoperative cardiovascular risk assessment.  History of Present Illness    Madison [name] is a 42 y.o. female who presents via audio/video conferencing for a telehealth visit today.  Pt was last seen in cardiology clinic on 11/11/2022 by Dr. Elberta Fortis. At that time SYAH FAMOUS was doing well.  The patient is now pending procedure as outlined above. Since her last visit, she has done well from a cardiac standpoint.   She denies chest pain, palpitations, dyspnea, pnd, orthopnea, n, v, dizziness, syncope, edema, weight gain, or early satiety. All other systems reviewed and are otherwise negative except as noted above.   Past Medical History    Past Medical History:  Diagnosis Date   Allergy    Anxiety    Asthma    Back pain    Constipation    Eczema    Exercise-induced  bronchospasm 06/12/2016   GERD (gastroesophageal reflux disease)    Herpes simplex disease    Hyperlipidemia    Hypothyroidism    Insomnia    Joint pain    Palpitation    SOB (shortness of breath)    SVT (supraventricular tachycardia) (HCC) 11/30/2016   Swelling    bilat LE   Urticaria    Vitamin D deficiency    Past Surgical History:  Procedure Laterality Date   Gum Grafting  11/2015, 06/2017   KNEE ARTHROSCOPY Right 2001   WISDOM TOOTH EXTRACTION Bilateral 1997    Allergies  Allergies  Allergen Reactions   Shellfish-Derived Products     Other reaction(s): hives   Trazodone     Other reaction(s): SVT   Latex Rash   Neosporin [Neomycin-Bacitracin Zn-Polymyx] Rash    Home Medications    Prior to Admission medications   Medication Sig Start Date End Date Taking? Authorizing Provider  acetaminophen (TYLENOL) 650 MG CR tablet Take 1 tablet (650 mg total) by mouth at bedtime. 08/31/23   Monica Becton, MD  busPIRone (BUSPAR) 15 MG tablet Take 1 tablet (15 mg total) by mouth 2 (two) times daily as needed (anxiety). 06/17/23   Charlton Amor, DO  celecoxib (CELEBREX) 200 MG capsule Take 1-2 capsules (200-400 mg total) by mouth daily as needed. 07/26/23   Monica Becton, MD  Cetirizine HCl (ZYRTEC PO) Take by mouth.    [provider]  cyclobenzaprine (FLEXERIL) 10 MG tablet Take 0.5-1 tablets (5-10 mg  total) by mouth at bedtime. 08/31/23   Monica Becton, MD  diltiazem (CARDIZEM CD) 300 MG 24 hr capsule Take 1 capsule (300 mg total) by mouth daily.Needs appt for future refills. 08/11/23   Camnitz, Andree Coss, MD  EPINEPHrine 0.3 mg/0.3 mL IJ SOAJ injection Inject as directed in emergency situation for severe allergic reaction 01/27/23   Charlton Amor, DO  escitalopram (LEXAPRO) 10 MG tablet Take 1 tablet (10 mg total) by mouth daily. 07/19/23   Charlton Amor, DO  levothyroxine (SYNTHROID) 112 MCG tablet Take 1 tablet (112 mcg total) by mouth daily  before breakfast. 06/22/23   Charlton Amor, DO  Norgestimate-Ethinyl Estradiol Triphasic (TRI-VYLIBRA) 0.18/0.215/0.25 MG-35 MCG tablet Take 1 tablet by mouth daily. 07/26/23   Charlton Amor, DO  traMADol (ULTRAM) 50 MG tablet Take 1 tablet (50 mg total) by mouth every 8 (eight) hours as needed for moderate pain (pain score 4-6). 09/19/23   Monica Becton, MD    Physical Exam    Vital Signs:  LUCIENNE AMBS does not have vital signs available for review today.  Given telephonic nature of communication, physical exam is limited. AAOx3. NAD. Normal affect.  Speech and respirations are unlabored.  Accessory Clinical Findings    None  Assessment & Plan    1.  Preoperative Cardiovascular Risk Assessment:  According to the Revised Cardiac Risk Index (RCRI), her Perioperative Risk of Major Cardiac Event is (%): 0.4. Her Functional Capacity in METs is: 8.91 according to the Duke Activity Status Index (DASI). Therefore, based on ACC/AHA guidelines, patient would be at acceptable risk for the planned procedure without further cardiovascular testing.   The patient was advised that if she develops new symptoms prior to surgery to contact our office to arrange for a follow-up visit, and she verbalized understanding.  A copy of this note will be routed to requesting surgeon.  Time:   Today, I have spent 5 minutes with the patient with telehealth technology discussing medical history, symptoms, and management plan.     Joylene Grapes, NP  09/23/2023, 1:56 PM

## 2023-10-01 ENCOUNTER — Ambulatory Visit (INDEPENDENT_AMBULATORY_CARE_PROVIDER_SITE_OTHER): Payer: Commercial Managed Care - PPO | Admitting: Family Medicine

## 2023-10-01 ENCOUNTER — Encounter: Payer: Self-pay | Admitting: Family Medicine

## 2023-10-01 VITALS — BP 136/93 | HR 81 | Ht 64.5 in | Wt 243.2 lb

## 2023-10-01 DIAGNOSIS — R030 Elevated blood-pressure reading, without diagnosis of hypertension: Secondary | ICD-10-CM | POA: Insufficient documentation

## 2023-10-01 DIAGNOSIS — Z01818 Encounter for other preprocedural examination: Secondary | ICD-10-CM | POA: Insufficient documentation

## 2023-10-01 NOTE — Assessment & Plan Note (Addendum)
-   elevated blood pressure reading, will repeat. Repeat BP in clinic is 136/93.

## 2023-10-01 NOTE — Assessment & Plan Note (Signed)
Pt has pmh of morbid obesity BMI >40, PSVT (followed by cardiology), hyperlipidemia, and hypothyroidism. BP elevated in clinic today but patient said she drank an energy drink prior to coming to clinic. With these pmh pt is at a moderate to high risk surgical candidate.

## 2023-10-01 NOTE — Progress Notes (Signed)
Established patient visit   Patient: Madison [name]   DOB: [birth year]   42 y.o. Female  MRN: [id number] Visit Date: 10/01/2023  Today's healthcare provider: Charlton Amor, DO   Chief Complaint  Patient presents with   Pre-op Exam    SUBJECTIVE    Chief Complaint  Patient presents with   Pre-op Exam   HPI   Pt presents for pre-op evaluation for knee scope.   Pmh of PSVT (followed by cardiology), hyperlipidemia, hypothyroidism, obesity with BMI of 41.   Review of Systems  Constitutional:  Negative for activity change, fatigue and fever.  Respiratory:  Negative for cough and shortness of breath.   Cardiovascular:  Negative for chest pain.  Gastrointestinal:  Negative for abdominal pain.  Genitourinary:  Negative for difficulty urinating.       Current Meds  Medication Sig   acetaminophen (TYLENOL) 650 MG CR tablet Take 1 tablet (650 mg total) by mouth at bedtime.   busPIRone (BUSPAR) 15 MG tablet Take 1 tablet (15 mg total) by mouth 2 (two) times daily as needed (anxiety).   celecoxib (CELEBREX) 200 MG capsule Take 1-2 capsules (200-400 mg total) by mouth daily as needed.   Cetirizine HCl (ZYRTEC PO) Take by mouth.   cyclobenzaprine (FLEXERIL) 10 MG tablet Take 0.5-1 tablets (5-10 mg total) by mouth at bedtime.   diltiazem (CARDIZEM CD) 300 MG 24 hr capsule Take 1 capsule (300 mg total) by mouth daily.Needs appt for future refills.   EPINEPHrine 0.3 mg/0.3 mL IJ SOAJ injection Inject as directed in emergency situation for severe allergic reaction   escitalopram (LEXAPRO) 10 MG tablet Take 1 tablet (10 mg total) by mouth daily.   levothyroxine (SYNTHROID) 112 MCG tablet Take 1 tablet (112 mcg total) by mouth daily before breakfast.   Norgestimate-Ethinyl Estradiol Triphasic (TRI-VYLIBRA) 0.18/0.215/0.25 MG-35 MCG tablet Take 1 tablet by mouth daily.   traMADol (ULTRAM) 50 MG tablet Take 1 tablet (50 mg total) by mouth every 8 (eight) hours as needed for moderate pain  (pain score 4-6).    OBJECTIVE    BP (!) 136/93 (BP Location: Left Arm, Patient Position: Sitting, Cuff Size: Large)   Pulse 81   Ht 5' 4.5" (1.638 m)   Wt 243 lb 4 oz (110.3 kg)   SpO2 100%   BMI 41.11 kg/m   Physical Exam Vitals and nursing note reviewed.  Constitutional:      General: She is not in acute distress.    Appearance: Normal appearance.  HENT:     Head: Normocephalic and atraumatic.     Right Ear: External ear normal.     Left Ear: External ear normal.     Nose: Nose normal.  Eyes:     Conjunctiva/sclera: Conjunctivae normal.  Cardiovascular:     Rate and Rhythm: Normal rate and regular rhythm.  Pulmonary:     Effort: Pulmonary effort is normal.     Breath sounds: Normal breath sounds.  Neurological:     General: No focal deficit present.     Mental Status: She is alert and oriented to person, place, and time.  Psychiatric:        Mood and Affect: Mood normal.        Behavior: Behavior normal.        Thought Content: Thought content normal.        Judgment: Judgment normal.        ASSESSMENT & PLAN    Problem List Items Addressed  This Visit       Other   Pre-op evaluation - Primary    Pt has pmh of morbid obesity BMI >40, PSVT (followed by cardiology), hyperlipidemia, and hypothyroidism. BP elevated in clinic today but patient said she drank an energy drink prior to coming to clinic. With these pmh pt is at a moderate to high risk surgical candidate.      Elevated blood pressure reading    - elevated blood pressure reading, will repeat. Repeat BP in clinic is 136/93.        Return in about 2 months (around 12/01/2023) for bp check and weight management.      No orders of the defined types were placed in this encounter.   No orders of the defined types were placed in this encounter.    Charlton Amor, DO  [facility] at [facility] [phone] (phone) [phone] (fax)  [facility]

## 2023-10-06 ENCOUNTER — Encounter (INDEPENDENT_AMBULATORY_CARE_PROVIDER_SITE_OTHER): Payer: Commercial Managed Care - PPO | Admitting: Sports Medicine

## 2023-10-06 DIAGNOSIS — S83203D Other tear of unspecified meniscus, current injury, right knee, subsequent encounter: Secondary | ICD-10-CM

## 2023-10-06 NOTE — Telephone Encounter (Signed)

## 2023-10-11 ENCOUNTER — Encounter: Payer: Self-pay | Admitting: Family Medicine

## 2023-10-21 ENCOUNTER — Other Ambulatory Visit: Payer: Self-pay | Admitting: Sports Medicine

## 2023-10-21 ENCOUNTER — Other Ambulatory Visit (HOSPITAL_COMMUNITY): Payer: Self-pay

## 2023-10-21 DIAGNOSIS — M25561 Pain in right knee: Secondary | ICD-10-CM

## 2023-10-21 MED ORDER — TRAMADOL HCL 50 MG PO TABS
50.0000 mg | ORAL_TABLET | Freq: Three times a day (TID) | ORAL | 0 refills | Status: DC | PRN
  Filled 2023-10-21: qty 60, 20d supply, fill #0

## 2023-10-23 ENCOUNTER — Other Ambulatory Visit (HOSPITAL_COMMUNITY): Payer: Self-pay

## 2023-10-30 NOTE — H&P (Cosign Needed)
PREOPERATIVE H&P  Chief Complaint: right knee medial meniscus tear  HPI: Madison [name] is a 42 y.o. female who is scheduled for, Procedure(s): KNEE ARTHROSCOPY WITH MEDIAL MENISECTOMY.   Patient has a past medical history significant for Hashimotos thyroiditis, SVT, obesity, GAD and right knee pain.   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  She has tried and failed injections.  Symptoms are rated as moderate to severe, and have been worsening.  This is significantly impairing activities of daily living.    Please see clinic note for further details on this patient's care.    She has elected for surgical management.    Surgical clearance provided by- PCP Cicero Duck Wachs DO, 10/11/23) Cardiology (Dr. Elberta Fortis, 09/23/23)  Past Medical History:  Diagnosis Date   Allergy    Anxiety    Asthma    Back pain    Constipation    Eczema    Exercise-induced bronchospasm 06/12/2016   GERD (gastroesophageal reflux disease)    Herpes simplex disease    Hyperlipidemia    Hypothyroidism    Insomnia    Joint pain    Palpitation    SOB (shortness of breath)    SVT (supraventricular tachycardia) (HCC) 11/30/2016   Swelling    bilat LE   Urticaria    Vitamin D deficiency    Past Surgical History:  Procedure Laterality Date   Gum Grafting  11/2015, 06/2017   KNEE ARTHROSCOPY Right 2001   WISDOM TOOTH EXTRACTION Bilateral 1997   Social History   Socioeconomic History   Marital status: Significant Other    Spouse name: Not on file   Number of children: Not on file   Years of education: Not on file   Highest education level: Associate degree: academic program  Occupational History   Occupation: CMA  Tobacco Use   Smoking status: Never   Smokeless tobacco: Never  Vaping Use   Vaping status: Never Used  Substance and Sexual Activity   Alcohol use: Yes    Alcohol/week: 4.0 standard drinks of alcohol     Types: 4 Standard drinks or equivalent per week   Drug use: No   Sexual activity: Yes    Birth control/protection: Pill  Other Topics Concern   Not on file  Social History Narrative   Lives in apartment with carpeting.  No pets.  Electric heat and central cooling.     Social Drivers of Corporate investment banker Strain: Low Risk  (08/27/2023)   Overall Financial Resource Strain (CARDIA)    Difficulty of Paying Living Expenses: Not hard at all  Food Insecurity: No Food Insecurity (08/27/2023)   Hunger Vital Sign    Worried About Running Out of Food in the Last Year: Never true    Ran Out of Food in the Last Year: Never true  Transportation Needs: No Transportation Needs (08/27/2023)   PRAPARE - Administrator, Civil Service (Medical): No    Lack of Transportation (Non-Medical): No  Physical Activity: Sufficiently Active (08/27/2023)   Exercise Vital Sign    Days of Exercise per Week: 4 days    Minutes of Exercise per Session: 60 min  Stress: No Stress Concern Present (08/27/2023)   [employer] of Occupational Health - Occupational Stress Questionnaire    Feeling of Stress : Only a little  Recent Concern: Stress - Stress Concern Present (06/16/2023)   [place]  [employer]stitute of Occupational Health - Occupational Stress Questionnaire    Feeling of Stress : To some extent  Social Connections: Moderately Isolated (08/27/2023)   Social Connection and Isolation Panel [NHANES]    Frequency of Communication with Friends and Family: More than three times a week    Frequency of Social Gatherings with Friends and Family: Patient declined    Attends Religious Services: Never    Database administrator or Organizations: No    Attends Engineer, structural: Not on file    Marital Status: Living with partner   Family History  Problem Relation Age of Onset   High blood pressure Mother    High Cholesterol Mother    Thyroid disease Mother    Eczema Father    Sleep apnea  Father    Allergic rhinitis Neg Hx    Angioedema Neg Hx    Asthma Neg Hx    Atopy Neg Hx    Immunodeficiency Neg Hx    Urticaria Neg Hx    Breast cancer Neg Hx    Allergies  Allergen Reactions   Shellfish-Derived Products     Other reaction(s): hives   Trazodone     Other reaction(s): SVT   Latex Rash   Neosporin [Neomycin-Bacitracin Zn-Polymyx] Rash   Prior to Admission medications   Medication Sig Start Date End Date Taking? Authorizing Provider  acetaminophen (TYLENOL) 650 MG CR tablet Take 1 tablet (650 mg total) by mouth at bedtime. 08/31/23   Monica Becton, MD  busPIRone (BUSPAR) 15 MG tablet Take 1 tablet (15 mg total) by mouth 2 (two) times daily as needed (anxiety). 06/17/23   Charlton Amor, DO  celecoxib (CELEBREX) 200 MG capsule Take 1-2 capsules (200-400 mg total) by mouth daily as needed. 07/26/23   Monica Becton, MD  Cetirizine HCl (ZYRTEC PO) Take by mouth.    [provider]  cyclobenzaprine (FLEXERIL) 10 MG tablet Take 0.5-1 tablets (5-10 mg total) by mouth at bedtime. 08/31/23   Monica Becton, MD  diltiazem (CARDIZEM CD) 300 MG 24 hr capsule Take 1 capsule (300 mg total) by mouth daily.Needs appt for future refills. 08/11/23   Camnitz, Andree Coss, MD  EPINEPHrine 0.3 mg/0.3 mL IJ SOAJ injection Inject as directed in emergency situation for severe allergic reaction 01/27/23   Charlton Amor, DO  escitalopram (LEXAPRO) 10 MG tablet Take 1 tablet (10 mg total) by mouth daily. 07/19/23   Charlton Amor, DO  levothyroxine (SYNTHROID) 112 MCG tablet Take 1 tablet (112 mcg total) by mouth daily before breakfast. 06/22/23   Charlton Amor, DO  Norgestimate-Ethinyl Estradiol Triphasic (TRI-VYLIBRA) 0.18/0.215/0.25 MG-35 MCG tablet Take 1 tablet by mouth daily. 07/26/23   Charlton Amor, DO  traMADol (ULTRAM) 50 MG tablet Take 1 tablet (50 mg total) by mouth every 8 (eight) hours as needed for moderate pain (pain score 4-6). 10/21/23   Monica Becton, MD    ROS: All other systems have been reviewed and were otherwise negative with the exception of those mentioned in the HPI and as above.  Physical Exam: General: Alert, no acute distress Cardiovascular: No pedal edema Respiratory: No cyanosis, no use of accessory musculature GI: No organomegaly, abdomen is soft and non-tender Skin: No lesions in the area of chief complaint Neurologic: Sensation intact distally Psychiatric: Patient is competent for consent with normal mood and affect Lymphatic: No axillary or cervical lymphadenopathy  MUSCULOSKELETAL:  Range of motion of the knee is full.  Positive McMurray.  Tender to palpation at the medial joint line.  Ligamentous exam is normal.    Imaging: MRI demonstrates a complex medial meniscus tear.  There are some mild tricompartmental changes.   Assessment: right knee medial meniscus tear  Plan: Plan for Procedure(s): KNEE ARTHROSCOPY WITH MEDIAL MENISECTOMY   The risks benefits and alternatives were discussed with the patient including but not limited to the risks of nonoperative treatment, versus surgical intervention including infection, bleeding, nerve injury,  blood clots, cardiopulmonary complications, morbidity, mortality, among others, and they were willing to proceed.   The patient acknowledged the explanation, agreed to proceed with the plan and consent was signed.   Operative Plan: Right knee arthroscopy, medial meniscectomy  Discharge Medications: Oxycodone, tylenol, meloxicam, zofran DVT Prophylaxis: ASA 81mg  BID x 6 weeks Physical Therapy: outpaitent Special Discharge needs: Acewrap, iceman   Corinna Capra, PA-C  10/30/2023 11:43 AM

## 2023-11-01 ENCOUNTER — Encounter: Payer: Self-pay | Admitting: Family Medicine

## 2023-11-01 ENCOUNTER — Telehealth: Payer: Commercial Managed Care - PPO | Admitting: Physician Assistant

## 2023-11-01 ENCOUNTER — Other Ambulatory Visit: Payer: Self-pay | Admitting: Nurse Practitioner

## 2023-11-01 ENCOUNTER — Other Ambulatory Visit (HOSPITAL_COMMUNITY): Payer: Self-pay

## 2023-11-01 ENCOUNTER — Other Ambulatory Visit (HOSPITAL_BASED_OUTPATIENT_CLINIC_OR_DEPARTMENT_OTHER): Payer: Self-pay

## 2023-11-01 DIAGNOSIS — B9689 Other specified bacterial agents as the cause of diseases classified elsewhere: Secondary | ICD-10-CM | POA: Diagnosis not present

## 2023-11-01 DIAGNOSIS — J019 Acute sinusitis, unspecified: Secondary | ICD-10-CM | POA: Diagnosis not present

## 2023-11-01 DIAGNOSIS — J0140 Acute pansinusitis, unspecified: Secondary | ICD-10-CM

## 2023-11-01 MED ORDER — AMOXICILLIN-POT CLAVULANATE 875-125 MG PO TABS
1.0000 | ORAL_TABLET | Freq: Two times a day (BID) | ORAL | 0 refills | Status: AC
  Filled 2023-11-01: qty 14, 7d supply, fill #0

## 2023-11-01 NOTE — Progress Notes (Signed)

## 2023-11-01 NOTE — Progress Notes (Signed)
I have spent 5 minutes in review of e-visit questionnaire, review and updating patient chart, medical decision making and response to patient.   Mia Milan Cody Jacklynn Dehaas, PA-C    

## 2023-11-02 ENCOUNTER — Other Ambulatory Visit (HOSPITAL_COMMUNITY): Payer: Self-pay

## 2023-11-08 ENCOUNTER — Other Ambulatory Visit (HOSPITAL_COMMUNITY): Payer: Self-pay

## 2023-11-08 ENCOUNTER — Other Ambulatory Visit: Payer: Self-pay | Admitting: Cardiology

## 2023-11-08 MED ORDER — DILTIAZEM HCL ER COATED BEADS 300 MG PO CP24
300.0000 mg | ORAL_CAPSULE | Freq: Every day | ORAL | 0 refills | Status: DC
  Filled 2023-11-08: qty 30, 30d supply, fill #0

## 2023-11-13 ENCOUNTER — Other Ambulatory Visit (HOSPITAL_COMMUNITY): Payer: Self-pay

## 2023-11-17 ENCOUNTER — Encounter (HOSPITAL_BASED_OUTPATIENT_CLINIC_OR_DEPARTMENT_OTHER): Payer: Self-pay | Admitting: Orthopaedic Surgery

## 2023-11-17 ENCOUNTER — Other Ambulatory Visit: Payer: Self-pay

## 2023-11-17 ENCOUNTER — Encounter (HOSPITAL_BASED_OUTPATIENT_CLINIC_OR_DEPARTMENT_OTHER)
Admission: RE | Admit: 2023-11-17 | Discharge: 2023-11-17 | Disposition: A | Discharge status: Home / Self Care | Payer: Commercial Managed Care - PPO | Source: Ambulatory Visit | Attending: Orthopaedic Surgery | Admitting: Orthopaedic Surgery

## 2023-11-17 DIAGNOSIS — I471 Supraventricular tachycardia, unspecified: Secondary | ICD-10-CM | POA: Diagnosis not present

## 2023-11-17 DIAGNOSIS — Z01810 Encounter for preprocedural cardiovascular examination: Secondary | ICD-10-CM | POA: Diagnosis not present

## 2023-11-17 NOTE — Progress Notes (Signed)
   11/17/23 1132  Pre-op Phone Call  Surgery Date Verified 11/24/23  Arrival Time Verified (S)   (Dr Henri office will give this info)  Surgery Location Verified [facility] [place]  Is the patient taking a GLP-1 receptor agonist? No  Does the patient have diabetes? No diagnosis of diabetes  Do you have a history of heart problems? Yes  Cardiologist Name hx SVT (see epic notes; reviewed with Dr Jerrye)  Antiarrhythmic device type  (NA)  Does patient have other implanted devices? No  Patient Teaching Pre / Post Procedure  Patient educated about smoking cessation 24 hours prior to surgery. N/A Non-Smoker  Patient verbalizes understanding of bowel prep? N/A  Med Rec Completed Yes  Take the Following Meds the Morning of Surgery hold NSAID, vit/supp x5d. No meds AM of sx  Recent  Lab Work, EKG, CXR? No  NPO (Including gum & candy) After midnight  Stop Solids, Milk, Candy, and Gum STARTING AT MIDNIGHT  Responsible adult to drive and be with you for 24 hours? Yes  Name & Phone Number for Ride/Caregiver jeff  No Jewelry, money, nail polish or make-up.  No lotions, powders, perfumes. No shaving  48 hrs. prior to surgery. Yes  Contacts, Dentures & Glasses Will Have to be Removed Before OR. Yes  Please bring your ID and Insurance Card the morning of your surgery. (Surgery Centers Only) Yes  Bring any papers or x-rays with you that your surgeon gave you. Yes  Instructed to contact the location of procedure/ provider if they or anyone in their household develops symptoms or tests positive for COVID-19, has close contact with someone who tests positive for COVID, or has known exposure to any contagious illness. Yes  Call this number the morning of surgery  with any problems that may cancel your surgery. [phone]  Covid-19 Assessment  Have you had a positive COVID-19 test within the previous 90 days? No  COVID Testing Guidance Proceed with the additional questions.  Patient's surgery required a COVID-19  test (cardiothoracic, complex ENT, and bronchoscopies/ EBUS) No  Have you been unmasked and in close contact with anyone with COVID-19 or COVID-19 symptoms within the past 10 days? No  Do you or anyone in your household currently have any COVID-19 symptoms? No

## 2023-11-17 NOTE — Progress Notes (Signed)
 Reviewed cardiac note (including recent virtual visit) and recent sinus infection with Dr Malen Gauze who said OK to proceed with surgery as planned as long as her pulmonary status is back to normal at this time.

## 2023-11-17 NOTE — Progress Notes (Signed)
 Spoke with pt during preop phone call. She confirms she has no pulmonary symptoms and her sinus infection is completely resolved. Will proceed with surgery (per Dr Mercy Riding recommendation in previous note)

## 2023-11-24 NOTE — Discharge Instructions (Addendum)
Ramond Marrow MD, MPH Alfonse Alpers, PA-C [facility] [address] [phone] (tel)   [phone] (fax)   POST-OPERATIVE INSTRUCTIONS - Knee Arthroscopy  WOUND CARE - You may remove the Operative Dressing on Post-Op Day #3 (72hrs after surgery).   -  Alternatively if you would like you can leave dressing on until follow-up if within 7-8 days but keep it dry. - Leave steri-strips in place until they fall off on their own, usually 2 weeks postop. - An ACE wrap may be used to control swelling, do not wrap this too tight.  If the initial ACE wrap feels too tight you may loosen it. - There may be a small amount of fluid/bleeding leaking at the surgical site.  - This is normal; the knee is filled with fluid during the procedure and can leak for 24-48hrs after surgery. You may change/reinforce the bandage as needed.  - Use the Cryocuff or Ice as often as possible for the first 7 days, then as needed for pain relief. Always keep a towel, ACE wrap or other barrier between the cooling unit and your skin.  - You may shower on Post-Op Day #3. Gently pat the area dry.  - Do not soak the knee in water or submerge it.  - Do not go swimming in the pool or ocean until 4 weeks after surgery or when otherwise instructed.  Keep dry incisions as dry as possible.   BRACE/AMBULATION  -            You will not need a brace after this procedure.   - You may use crutches initially to help you weight bear, but this is not required - You can put full weight on your operative leg as you feel comfortable  PHYSICAL THERAPY - You will begin physical therapy soon after surgery (unless otherwise specified) - Please call to set up an appointment, if you do not already have one  - Let our office if there are any issues with scheduling your therapy  - You have a physical therapy appointment scheduled at [facility] (across the hall from our office) on 1/20   REGIONAL ANESTHESIA (NERVE  BLOCKS) The anesthesia team may have performed a nerve block for you this is a great tool used to minimize pain.   The block may start wearing off overnight (between 8-24 hours postop) When the block wears off, your pain may go from nearly zero to the pain you would have had postop without the block. This is an abrupt transition but nothing dangerous is happening.   This can be a challenging period but utilize your as needed pain medications to try and manage this period. We suggest you use the pain medication the first night prior to going to bed, to ease this transition.  You may take an extra dose of narcotic when this happens if needed   POST-OP MEDICATIONS- Multimodal approach to pain control In general your pain will be controlled with a combination of substances.  Prescriptions unless otherwise discussed are electronically sent to your pharmacy.  This is a carefully made plan we use to minimize narcotic use.     Celebrex - Anti-inflammatory medication taken on a scheduled basis Acetaminophen - Non-narcotic pain medicine taken on a scheduled basis  Tramadol - This is a strong narcotic, to be used only on an "as needed" basis for SEVERE pain. Aspirin 81mg  - This medicine is used to minimize the risk of blood clots after  surgery. Zofran - take as needed for nausea   FOLLOW-UP   Please call the office to schedule a follow-up appointment for your incision check, 7-10 days post-operatively.   IF YOU HAVE ANY QUESTIONS, PLEASE FEEL FREE TO CALL OUR OFFICE.   HELPFUL INFORMATION   Keep your leg elevated to decrease swelling, which will then in turn decrease your pain. I would elevate the foot of your bed by putting a couple of couch pillows between your mattress and box spring. I would not keep pillow directly under your ankle.  - Do not sleep with a pillow behind your knee even if it is more comfortable as this may make it harder to get your knee fully straight long term.   There  will be MORE swelling on days 1-3 than there is on the day of surgery.  This also is normal. The swelling will decrease with the anti-inflammatory medication, ice and keeping it elevated. The swelling will make it more difficult to bend your knee. As the swelling goes down your motion will become easier   You may develop swelling and bruising that extends from your knee down to your calf and perhaps even to your foot over the next week. Do not be alarmed. This too is normal, and it is due to gravity   There may be some numbness adjacent to the incision site. This may last for 6-12 months or longer in some patients and is expected.   You may return to sedentary work/school in the next couple of days when you feel up to it. You will need to keep your leg elevated as much as possible    You should wean off your narcotic medicines as soon as you are able.  Most patients will be off narcotics before their first postop appointment.    We suggest you use the pain medication the first night prior to going to bed, in order to ease any pain when the anesthesia wears off. You should avoid taking pain medications on an empty stomach as it will make you nauseous.   Do not drink alcoholic beverages or take illicit drugs when taking pain medications.   It is against the law to drive while taking narcotics. You cannot drive if your Right leg is in brace locked in extension.   Pain medication may make you constipated.  Below are a few solutions to try in this order:  o Decrease the amount of pain medication if you aren't having pain.  o Drink lots of decaffeinated fluids.  o Drink prune juice and/or eat dried prunes   o If the first 3 don't work start with additional solutions  o Take Colace - an over-the-counter stool softener  o Take Senokot - an over-the-counter laxative  o Take Miralax - a stronger over-the-counter laxative    For more information including helpful videos and documents visit  our website:   [email or web address]    Post Anesthesia Home Care Instructions  Activity: Get plenty of rest for the remainder of the day. A responsible individual must stay with you for 24 hours following the procedure.  For the next 24 hours, DO NOT: -Drive a car -Advertising copywriter -Drink alcoholic beverages -Take any medication unless instructed by your physician -Make any legal decisions or sign important papers.  Meals: Start with liquid foods such as gelatin or soup. Progress to regular foods as tolerated. Avoid greasy, spicy, heavy foods. If nausea and/or vomiting occur, drink only clear liquids until the nausea and/or  vomiting subsides. Call your physician if vomiting continues.  Special Instructions/Symptoms: Your throat may feel dry or sore from the anesthesia or the breathing tube placed in your throat during surgery. If this causes discomfort, gargle with warm salt water. The discomfort should disappear within 24 hours.  If you had a scopolamine patch placed behind your ear for the management of post- operative nausea and/or vomiting:  1. The medication in the patch is effective for 72 hours, after which it should be removed.  Wrap patch in a tissue and discard in the trash. Wash hands thoroughly with soap and water. 2. You may remove the patch earlier than 72 hours if you experience unpleasant side effects which may include dry mouth, dizziness or visual disturbances. 3. Avoid touching the patch. Wash your hands with soap and water after contact with the patch.    No Tylenol until 12:30pm today if needed

## 2023-11-25 ENCOUNTER — Other Ambulatory Visit (HOSPITAL_COMMUNITY): Payer: Self-pay

## 2023-11-25 ENCOUNTER — Other Ambulatory Visit: Payer: Self-pay

## 2023-11-25 ENCOUNTER — Encounter (HOSPITAL_BASED_OUTPATIENT_CLINIC_OR_DEPARTMENT_OTHER)
Admission: RE | Disposition: A | Discharge status: Home / Self Care | Payer: Self-pay | Source: Home / Self Care | Attending: Orthopaedic Surgery

## 2023-11-25 ENCOUNTER — Ambulatory Visit (HOSPITAL_BASED_OUTPATIENT_CLINIC_OR_DEPARTMENT_OTHER): Payer: Commercial Managed Care - PPO | Admitting: Anesthesiology

## 2023-11-25 ENCOUNTER — Ambulatory Visit (HOSPITAL_BASED_OUTPATIENT_CLINIC_OR_DEPARTMENT_OTHER)
Admission: RE | Admit: 2023-11-25 | Discharge: 2023-11-25 | Disposition: A | Discharge status: Home / Self Care | Payer: Commercial Managed Care - PPO | Attending: Orthopaedic Surgery | Admitting: Orthopaedic Surgery

## 2023-11-25 ENCOUNTER — Encounter (HOSPITAL_BASED_OUTPATIENT_CLINIC_OR_DEPARTMENT_OTHER): Payer: Self-pay | Admitting: Orthopaedic Surgery

## 2023-11-25 DIAGNOSIS — Z01818 Encounter for other preprocedural examination: Secondary | ICD-10-CM

## 2023-11-25 DIAGNOSIS — J45990 Exercise induced bronchospasm: Secondary | ICD-10-CM | POA: Diagnosis not present

## 2023-11-25 DIAGNOSIS — X58XXXA Exposure to other specified factors, initial encounter: Secondary | ICD-10-CM | POA: Insufficient documentation

## 2023-11-25 DIAGNOSIS — Z419 Encounter for procedure for purposes other than remedying health state, unspecified: Secondary | ICD-10-CM

## 2023-11-25 DIAGNOSIS — S83241A Other tear of medial meniscus, current injury, right knee, initial encounter: Secondary | ICD-10-CM | POA: Diagnosis not present

## 2023-11-25 DIAGNOSIS — S83231A Complex tear of medial meniscus, current injury, right knee, initial encounter: Secondary | ICD-10-CM | POA: Diagnosis not present

## 2023-11-25 DIAGNOSIS — I471 Supraventricular tachycardia, unspecified: Secondary | ICD-10-CM

## 2023-11-25 DIAGNOSIS — E669 Obesity, unspecified: Secondary | ICD-10-CM | POA: Diagnosis not present

## 2023-11-25 DIAGNOSIS — K219 Gastro-esophageal reflux disease without esophagitis: Secondary | ICD-10-CM | POA: Insufficient documentation

## 2023-11-25 DIAGNOSIS — F411 Generalized anxiety disorder: Secondary | ICD-10-CM | POA: Insufficient documentation

## 2023-11-25 DIAGNOSIS — E063 Autoimmune thyroiditis: Secondary | ICD-10-CM | POA: Insufficient documentation

## 2023-11-25 HISTORY — PX: KNEE ARTHROSCOPY WITH MEDIAL MENISECTOMY: SHX5651

## 2023-11-25 HISTORY — DX: Nausea with vomiting, unspecified: R11.2

## 2023-11-25 HISTORY — DX: Other specified postprocedural states: Z98.890

## 2023-11-25 LAB — POCT PREGNANCY, URINE: Preg Test, Ur: NEGATIVE

## 2023-11-25 SURGERY — ARTHROSCOPY, KNEE, WITH MEDIAL MENISCECTOMY
Anesthesia: General | Site: Knee | Laterality: Right

## 2023-11-25 MED ORDER — SCOPOLAMINE 1 MG/3DAYS TD PT72
MEDICATED_PATCH | TRANSDERMAL | Status: AC
  Filled 2023-11-25: qty 1

## 2023-11-25 MED ORDER — PROPOFOL 10 MG/ML IV BOLUS
INTRAVENOUS | Status: DC | PRN
  Administered 2023-11-25: 200 mg via INTRAVENOUS
  Administered 2023-11-25: 10 mg via INTRAVENOUS

## 2023-11-25 MED ORDER — FENTANYL CITRATE (PF) 100 MCG/2ML IJ SOLN
INTRAMUSCULAR | Status: AC
  Filled 2023-11-25: qty 2

## 2023-11-25 MED ORDER — ASPIRIN 81 MG PO CHEW
81.0000 mg | CHEWABLE_TABLET | Freq: Two times a day (BID) | ORAL | 0 refills | Status: AC
  Filled 2023-11-25: qty 84, 42d supply, fill #0

## 2023-11-25 MED ORDER — BUPIVACAINE HCL (PF) 0.25 % IJ SOLN
INTRAMUSCULAR | Status: DC | PRN
  Administered 2023-11-25: 20 mL

## 2023-11-25 MED ORDER — MIDAZOLAM HCL 2 MG/2ML IJ SOLN
INTRAMUSCULAR | Status: AC
  Filled 2023-11-25: qty 2

## 2023-11-25 MED ORDER — FENTANYL CITRATE (PF) 100 MCG/2ML IJ SOLN
25.0000 ug | INTRAMUSCULAR | Status: DC | PRN
  Administered 2023-11-25: 25 ug via INTRAVENOUS

## 2023-11-25 MED ORDER — DROPERIDOL 2.5 MG/ML IJ SOLN: 0.6250 mg | Freq: Once | INTRAMUSCULAR | Status: DC | PRN

## 2023-11-25 MED ORDER — MIDAZOLAM HCL 2 MG/2ML IJ SOLN
INTRAMUSCULAR | Status: DC | PRN
  Administered 2023-11-25: 2 mg via INTRAVENOUS

## 2023-11-25 MED ORDER — ACETAMINOPHEN 500 MG PO TABS
1000.0000 mg | ORAL_TABLET | Freq: Three times a day (TID) | ORAL | 0 refills | Status: DC
  Filled 2023-11-25: qty 84, 14d supply, fill #0

## 2023-11-25 MED ORDER — LACTATED RINGERS IV SOLN: INTRAVENOUS | Status: DC

## 2023-11-25 MED ORDER — OXYCODONE HCL 5 MG PO TABS: 5.0000 mg | ORAL_TABLET | Freq: Once | ORAL | Status: DC | PRN

## 2023-11-25 MED ORDER — CELECOXIB 200 MG PO CAPS
200.0000 mg | ORAL_CAPSULE | Freq: Two times a day (BID) | ORAL | 0 refills | Status: DC
  Filled 2023-11-25: qty 60, 30d supply, fill #0

## 2023-11-25 MED ORDER — ONDANSETRON HCL 4 MG/2ML IJ SOLN
INTRAMUSCULAR | Status: DC | PRN
  Administered 2023-11-25: 4 mg via INTRAVENOUS

## 2023-11-25 MED ORDER — SCOPOLAMINE 1 MG/3DAYS TD PT72
1.0000 | MEDICATED_PATCH | Freq: Once | TRANSDERMAL | Status: DC
  Administered 2023-11-25: 1.5 mg via TRANSDERMAL

## 2023-11-25 MED ORDER — CEFAZOLIN SODIUM-DEXTROSE 2-4 GM/100ML-% IV SOLN: 2.0000 g | INTRAVENOUS | Status: DC

## 2023-11-25 MED ORDER — ACETAMINOPHEN 500 MG PO TABS
ORAL_TABLET | ORAL | Status: AC
  Filled 2023-11-25: qty 2

## 2023-11-25 MED ORDER — ACETAMINOPHEN 500 MG PO TABS
1000.0000 mg | ORAL_TABLET | Freq: Once | ORAL | Status: AC
  Administered 2023-11-25: 1000 mg via ORAL

## 2023-11-25 MED ORDER — CEFAZOLIN SODIUM-DEXTROSE 2-4 GM/100ML-% IV SOLN
INTRAVENOUS | Status: AC
Start: 2023-11-25 — End: ?
  Filled 2023-11-25: qty 100

## 2023-11-25 MED ORDER — TRAMADOL HCL 50 MG PO TABS
50.0000 mg | ORAL_TABLET | Freq: Four times a day (QID) | ORAL | 0 refills | Status: DC | PRN
  Filled 2023-11-25: qty 20, 5d supply, fill #0

## 2023-11-25 MED ORDER — FENTANYL CITRATE (PF) 100 MCG/2ML IJ SOLN
INTRAMUSCULAR | Status: DC | PRN
  Administered 2023-11-25 (×3): 50 ug via INTRAVENOUS

## 2023-11-25 MED ORDER — OXYCODONE HCL 5 MG/5ML PO SOLN: 5.0000 mg | Freq: Once | ORAL | Status: DC | PRN

## 2023-11-25 MED ORDER — ONDANSETRON HCL 4 MG PO TABS
4.0000 mg | ORAL_TABLET | Freq: Three times a day (TID) | ORAL | 0 refills | Status: AC | PRN
  Filled 2023-11-25: qty 10, 4d supply, fill #0

## 2023-11-25 MED ORDER — BUPIVACAINE HCL (PF) 0.25 % IJ SOLN
INTRAMUSCULAR | Status: AC
  Filled 2023-11-25: qty 30

## 2023-11-25 MED ORDER — CEFAZOLIN SODIUM-DEXTROSE 2-3 GM-%(50ML) IV SOLR
INTRAVENOUS | Status: DC | PRN
  Administered 2023-11-25: 2 g via INTRAVENOUS

## 2023-11-25 MED ORDER — DEXAMETHASONE SODIUM PHOSPHATE 10 MG/ML IJ SOLN
INTRAMUSCULAR | Status: DC | PRN
  Administered 2023-11-25: 10 mg via INTRAVENOUS

## 2023-11-25 MED ORDER — SODIUM CHLORIDE 0.9 % IR SOLN
Status: DC | PRN
  Administered 2023-11-25: 1500 mL

## 2023-11-25 MED ORDER — LIDOCAINE 2% (20 MG/ML) 5 ML SYRINGE
INTRAMUSCULAR | Status: DC | PRN
  Administered 2023-11-25: 80 mg via INTRAVENOUS

## 2023-11-25 MED ORDER — SODIUM CHLORIDE 0.9 % IV SOLN: INTRAVENOUS | Status: DC | PRN

## 2023-11-25 MED ORDER — DEXMEDETOMIDINE HCL IN NACL 80 MCG/20ML IV SOLN
INTRAVENOUS | Status: DC | PRN
  Administered 2023-11-25 (×3): 4 ug via INTRAVENOUS

## 2023-11-25 SURGICAL SUPPLY — 30 items
BANDAGE ESMARK 6X9 LF (GAUZE/BANDAGES/DRESSINGS) IMPLANT
BNDG ELASTIC 6INX 5YD STR LF (GAUZE/BANDAGES/DRESSINGS) ×1 IMPLANT
BNDG ESMARK 6X9 LF (GAUZE/BANDAGES/DRESSINGS)
CHLORAPREP W/TINT 26 (MISCELLANEOUS) ×1 IMPLANT
CLSR STERI-STRIP ANTIMIC 1/2X4 (GAUZE/BANDAGES/DRESSINGS) ×1 IMPLANT
CUFF TRNQT CYL 34X4.125X (TOURNIQUET CUFF) ×1 IMPLANT
DISSECTOR 4.0MMX13CM CVD (MISCELLANEOUS) ×1 IMPLANT
DRAPE ARTHROSCOPY W/POUCH 90 (DRAPES) ×1 IMPLANT
DRAPE IMP U-DRAPE 54X76 (DRAPES) ×1 IMPLANT
DRAPE U-SHAPE 47X51 STRL (DRAPES) ×1 IMPLANT
GAUZE SPONGE 4X4 12PLY STRL (GAUZE/BANDAGES/DRESSINGS) ×1 IMPLANT
GLOVE BIO SURGEON STRL SZ 6.5 (GLOVE) ×1 IMPLANT
GLOVE BIOGEL PI IND STRL 6.5 (GLOVE) ×1 IMPLANT
GLOVE BIOGEL PI IND STRL 8 (GLOVE) ×1 IMPLANT
GLOVE ECLIPSE 8.0 STRL XLNG CF (GLOVE) ×2 IMPLANT
GLOVE SURG SS PI 7.0 STRL IVOR (GLOVE) IMPLANT
GOWN STRL REUS W/ TWL LRG LVL3 (GOWN DISPOSABLE) ×1 IMPLANT
GOWN STRL REUS W/TWL XL LVL3 (GOWN DISPOSABLE) ×1 IMPLANT
KIT TURNOVER KIT B (KITS) ×1 IMPLANT
MANIFOLD NEPTUNE II (INSTRUMENTS) IMPLANT
NS IRRIG 1000ML POUR BTL (IV SOLUTION) IMPLANT
PACK ARTHROSCOPY DSU (CUSTOM PROCEDURE TRAY) ×1 IMPLANT
PACK ICE MAXI GEL EZY WRAP (MISCELLANEOUS) IMPLANT
SLEEVE SCD COMPRESS KNEE MED (STOCKING) ×1 IMPLANT
SUT MNCRL AB 4-0 PS2 18 (SUTURE) ×1 IMPLANT
TOWEL GREEN STERILE FF (TOWEL DISPOSABLE) ×1 IMPLANT
TUBE CONNECTING 20X1/4 (TUBING) ×1 IMPLANT
TUBING ARTHROSCOPY IRRIG 16FT (MISCELLANEOUS) ×1 IMPLANT
WAND ABLATOR APOLLO I90 (BUR) IMPLANT
WATER STERILE IRR 1000ML POUR (IV SOLUTION) ×1 IMPLANT

## 2023-11-25 NOTE — Transfer of Care (Signed)
Immediate Anesthesia Transfer of Care Note  Patient: Madison [name]  Procedure(s) Performed: KNEE ARTHROSCOPY WITH MEDIAL MENISECTOMY (Right: Knee)  Patient Location: PACU  Anesthesia Type:General  Level of Consciousness: awake and alert   Airway & Oxygen Therapy: Patient Spontanous Breathing and Patient connected to face mask oxygen  Post-op Assessment: Report given to RN and Post -op Vital signs reviewed and stable  Post vital signs: Reviewed and stable  Last Vitals:  Vitals Value Taken Time  BP 108/60 11/25/23 0815  Temp 36.7 C 11/25/23 0815  Pulse 103 11/25/23 0818  Resp 17 11/25/23 0818  SpO2 93 % 11/25/23 0818  Vitals shown include unfiled device data.  Last Pain:  Vitals:   11/25/23 0628  TempSrc: Temporal  PainSc: 3       Patients Stated Pain Goal: 4 (11/25/23 1610)  Complications: No notable events documented.

## 2023-11-25 NOTE — Op Note (Signed)
Orthopaedic Surgery Operative Note (CSN: [id number])  Madison [name]  [birth year] Date of Surgery: 11/25/2023   Diagnoses:  right knee medial meniscus tear  Procedure: Right partial medial meniscectomy and chondroplasty   Operative Finding Patient had a complex medial meniscus tear and areas of grade 3 cartilage changes diffusely across the medial femoral condyle.  There is about 40% total medial meniscal volume resected.  There were grade 2 and 3 changes in the patellofemoral joint the lateral compartment was relatively normal.  If patient fails she will see Dr. Benjamin Stain for continued nonsurgical injections to try and help maintain her knee until she is a candidate for arthroplasty.  Successful completion of the planned procedure.    Post-operative plan: The patient will be WBAT.  The patient will be discharged home.  DVT prophylaxis Aspirin 81 mg twice daily for 6 weeks.  Pain control with PRN pain medication preferring oral medicines.  Follow up plan will be scheduled in approximately 7 days for incision check.  Post-Op Diagnosis: Same Surgeons:Primary: Bjorn Pippin, MD Assistants:None Location: [facility] OR ROOM 6 Anesthesia: General with local Antibiotics: Ancef 2 g Tourniquet time: * No tourniquets in log * Estimated Blood Loss: Minimal Complications: None Specimens: None Implants: * No implants in log *  Indications for Surgery:   Madison [name] is a 43 y.o. female with meniscus tear and mechanical symptoms.  Benefits and risks of operative and nonoperative management were discussed prior to surgery with patient/guardian(s) and informed consent form was completed.  Specific risks including infection, need for additional surgery, postmeniscectomy syndrome, continued arthrosis and pain amongst others.   Procedure:   The patient was identified properly. Informed consent was obtained and the surgical site was marked. The patient was taken up to suite where general anesthesia was  induced. The patient was placed in the supine position with a post against the surgical leg and a nonsterile tourniquet applied. The surgical leg was then prepped and draped usual sterile fashion.  A standard surgical timeout was performed.  2 standard anterior portals were made and diagnostic arthroscopy performed. Please note the findings as noted above.  We used a shaver to perform a synovectomy of the anterior medial and anterolateral compartments as well as overgrowth along the patella.  We performed a medial meniscectomy using a shaver and basket back to a stable base removing all loose fragments.  Incisions closed with absorbable suture. The patient was awoken from general anesthesia and taken to the PACU in stable condition without complication.   No assistant available

## 2023-11-25 NOTE — Anesthesia Preprocedure Evaluation (Signed)
Anesthesia Evaluation  Patient identified by MRN, date of birth, ID band Patient awake    Reviewed: Allergy & Precautions, NPO status , Patient's Chart, lab work & pertinent test results  History of Anesthesia Complications (+) PONV and history of anesthetic complications  Airway Mallampati: II  TM Distance: >3 FB Neck ROM: Full    Dental  (+) Missing,    Pulmonary asthma    Pulmonary exam normal        Cardiovascular Normal cardiovascular exam+ dysrhythmias Supra Ventricular Tachycardia      Neuro/Psych   Anxiety Depression    negative neurological ROS     GI/Hepatic Neg liver ROS,GERD  ,,  Endo/Other  Hypothyroidism    Renal/GU negative Renal ROS  negative genitourinary   Musculoskeletal negative musculoskeletal ROS (+)    Abdominal   Peds  Hematology negative hematology ROS (+)   Anesthesia Other Findings Day of surgery medications reviewed with patient.  Reproductive/Obstetrics negative OB ROS                              Anesthesia Physical Anesthesia Plan  ASA: 2  Anesthesia Plan: General   Post-op Pain Management: Tylenol PO (pre-op)* and Toradol IV (intra-op)*   Induction: Intravenous  PONV Risk Score and Plan: 4 or greater and Treatment may vary due to age or medical condition, Ondansetron, Dexamethasone, Midazolam, Scopolamine patch - Pre-op and Propofol infusion  Airway Management Planned: LMA  Additional Equipment: None  Intra-op Plan:   Post-operative Plan: Extubation in OR  Informed Consent: I have reviewed the patients History and Physical, chart, labs and discussed the procedure including the risks, benefits and alternatives for the proposed anesthesia with the patient or authorized representative who has indicated his/her understanding and acceptance.     Dental advisory given  Plan Discussed with: CRNA  Anesthesia Plan Comments:           Anesthesia Quick Evaluation

## 2023-11-25 NOTE — Interval H&P Note (Signed)
All questions answered, patient wants to proceed with procedure. ? ?

## 2023-11-25 NOTE — Anesthesia Postprocedure Evaluation (Signed)
Anesthesia Post Note  Patient: Madison [name]  Procedure(s) Performed: KNEE ARTHROSCOPY WITH MEDIAL MENISECTOMY (Right: Knee)     Patient location during evaluation: PACU Anesthesia Type: General Level of consciousness: awake and alert Pain management: pain level controlled Vital Signs Assessment: post-procedure vital signs reviewed and stable Respiratory status: spontaneous breathing, nonlabored ventilation and respiratory function stable Cardiovascular status: blood pressure returned to baseline Postop Assessment: no apparent nausea or vomiting Anesthetic complications: no   No notable events documented.  Last Vitals:  Vitals:   11/25/23 0830 11/25/23 0845  BP: 120/81 118/82  Pulse: 95 85  Resp: 17 15  Temp:    SpO2: 97% 99%    Last Pain:  Vitals:   11/25/23 0845  TempSrc:   PainSc: 3                  Shanda Howells

## 2023-11-25 NOTE — Anesthesia Procedure Notes (Signed)
Procedure Name: LMA Insertion Date/Time: 11/25/2023 7:41 AM  Performed by: Alvera Novel, CRNAPre-anesthesia Checklist: Patient identified, Emergency Drugs available, Suction available and Patient being monitored Patient Re-evaluated:Patient Re-evaluated prior to induction Oxygen Delivery Method: Circle System Utilized Preoxygenation: Pre-oxygenation with 100% oxygen Induction Type: IV induction Ventilation: Mask ventilation without difficulty LMA: LMA inserted LMA Size: 4.0 Number of attempts: 1 Placement Confirmation: positive ETCO2 Tube secured with: Tape Dental Injury: Teeth and Oropharynx as per pre-operative assessment

## 2023-11-25 NOTE — Addendum Note (Signed)
Addendum  created 11/25/23 1054 by Alvera Novel, CRNA   Intraprocedure Event edited

## 2023-11-26 ENCOUNTER — Encounter (HOSPITAL_BASED_OUTPATIENT_CLINIC_OR_DEPARTMENT_OTHER): Payer: Self-pay | Admitting: Orthopaedic Surgery

## 2023-11-29 DIAGNOSIS — M25661 Stiffness of right knee, not elsewhere classified: Secondary | ICD-10-CM | POA: Diagnosis not present

## 2023-11-29 DIAGNOSIS — S83231D Complex tear of medial meniscus, current injury, right knee, subsequent encounter: Secondary | ICD-10-CM | POA: Diagnosis not present

## 2023-11-29 DIAGNOSIS — M6281 Muscle weakness (generalized): Secondary | ICD-10-CM | POA: Diagnosis not present

## 2023-11-29 NOTE — Progress Notes (Unsigned)
  Electrophysiology Office Note:   Date:  11/30/2023  ID:  Madison [name], DOB 25-Sep-1981, MRN [id number]  Primary Cardiologist: None Primary Heart Failure: None Electrophysiologist: Will Jorja Loa, MD      History of Present Illness:   Madison [name] is a 43 y.o. female with h/o SVT, hypothyroidism, depression / anxiety, obesity seen today for routine electrophysiology followup.   Since last being seen in our clinic the patient reports she has been doing very well. She has not had issues with elevated rates on cardizem. She uses an Biomedical scientist to monitor her HR which typically runs in the 60's.  She recently had knee surgery, EKG at that time reviewed.  She has started rehab for her knee. She denies chest pain, palpitations, dyspnea, PND, orthopnea, nausea, vomiting, dizziness, syncope, edema, weight gain, or early satiety.   Review of systems complete and found to be negative unless listed in HPI.   EP Information / Studies Reviewed:    EKG is not ordered today. EKG from 11/17/23 reviewed which showed NSR 84 bpm      Studies:  ECHO 01/2017 > LVEF 55-60%, LA mildly dilated Cardiac Monitor 01/2017 > NSR with ave HR 87 bpm, SVT -likely ST up to 175 bpm     Arrhythmia / AAD SVT > dx ~ 2018      Physical Exam:   VS:  BP 114/76   Pulse 92   Ht 5\' 5"  (1.651 m)   Wt 235 lb 3.2 oz (106.7 kg)   LMP 11/12/2023 (Exact Date) Comment: negative UPT 11/25/23  SpO2 99%   BMI 39.14 kg/m    Wt Readings from Last 3 Encounters:  11/30/23 235 lb 3.2 oz (106.7 kg)  11/25/23 235 lb 0.2 oz (106.6 kg)  10/01/23 243 lb 4 oz (110.3 kg)     GEN: Well nourished, well developed in no acute distress NECK: No JVD; No carotid bruits CARDIAC: Regular rate and rhythm, no murmurs, rubs, gallops RESPIRATORY:  Clear to auscultation without rales, wheezing or rhonchi  ABDOMEN: Soft, non-tender, non-distended EXTREMITIES:  No edema; No deformity   ASSESSMENT AND PLAN:    SVT  Prior ST on cardiac monitor   -continue diltiazem 300 mg every day -symptom burden well controlled, last episode ~ 2 years ago  -recent EKG reviewed, NSR   Follow up with EP APP in 12 months  Signed, Canary Brim, NP-C, AGACNP-BC  [facility] - Elect[facility]iology  11/30/2023, 10:16 AM

## 2023-11-30 ENCOUNTER — Encounter: Payer: Self-pay | Admitting: Pulmonary Disease

## 2023-11-30 ENCOUNTER — Other Ambulatory Visit (HOSPITAL_COMMUNITY): Payer: Self-pay

## 2023-11-30 ENCOUNTER — Ambulatory Visit: Payer: Commercial Managed Care - PPO | Attending: Pulmonary Disease | Admitting: Pulmonary Disease

## 2023-11-30 VITALS — BP 114/76 | HR 92 | Ht 65.0 in | Wt 235.2 lb

## 2023-11-30 DIAGNOSIS — I471 Supraventricular tachycardia, unspecified: Secondary | ICD-10-CM | POA: Diagnosis not present

## 2023-11-30 MED ORDER — DILTIAZEM HCL ER COATED BEADS 300 MG PO CP24
300.0000 mg | ORAL_CAPSULE | Freq: Every day | ORAL | 3 refills | Status: DC
  Filled 2023-11-30 – 2023-12-12 (×2): qty 90, 90d supply, fill #0
  Filled 2024-03-15: qty 90, 90d supply, fill #1
  Filled 2024-04-22 – 2024-06-21 (×2): qty 90, 90d supply, fill #2
  Filled 2024-09-17: qty 90, 90d supply, fill #3

## 2023-11-30 NOTE — Patient Instructions (Signed)
Medication Instructions:   Your physician recommends that you continue on your current medications as directed. Please refer to the Current Medication list given to you today.   *If you need a refill on your cardiac medications before your next appointment, please call your pharmacy*   Lab Work:  NONE ORDERED  TODAY   If you have labs (blood work) drawn today and your tests are completely normal, you will receive your results only by: MyChart Message (if you have MyChart) OR A paper copy in the mail If you have any lab test that is abnormal or we need to change your treatment, we will call you to review the results.   Testing/Procedures: NONE ORDERED  TODAY     Follow-Up: At [facility], you and your health needs are our priority.  As part of our continuing mission to provide you with exceptional heart care, we have created designated Provider Care Teams.  These Care Teams include your primary Cardiologist (physician) and Advanced Practice Providers (APPs -  Physician Assistants and Nurse Practitioners) who all work together to provide you with the care you need, when you need it.  We recommend signing up for the patient portal called "MyChart".  Sign up information is provided on this After Visit Summary.  MyChart is used to connect with patients for Virtual Visits (Telemedicine).  Patients are able to view lab/test results, encounter notes, upcoming appointments, etc.  Non-urgent messages can be sent to your provider as well.   To learn more about what you can do with MyChart, go to [email or web address].    Your next appointment:   1 year(s)  Provider:   Canary Brim, NP    Other Instructions

## 2023-12-01 ENCOUNTER — Ambulatory Visit: Payer: Commercial Managed Care - PPO | Admitting: Family Medicine

## 2023-12-01 ENCOUNTER — Encounter: Payer: Self-pay | Admitting: Family Medicine

## 2023-12-01 ENCOUNTER — Other Ambulatory Visit (HOSPITAL_COMMUNITY): Payer: Self-pay

## 2023-12-01 VITALS — BP 126/92 | HR 97 | Ht 65.0 in | Wt 235.0 lb

## 2023-12-01 DIAGNOSIS — Z6839 Body mass index (BMI) 39.0-39.9, adult: Secondary | ICD-10-CM | POA: Diagnosis not present

## 2023-12-01 DIAGNOSIS — E66812 Obesity, class 2: Secondary | ICD-10-CM

## 2023-12-01 MED ORDER — ZEPBOUND 2.5 MG/0.5ML ~~LOC~~ SOAJ
2.5000 mg | SUBCUTANEOUS | 0 refills | Status: DC
  Filled 2023-12-01 – 2023-12-03 (×2): qty 2, 28d supply, fill #0

## 2023-12-01 NOTE — Assessment & Plan Note (Signed)
Pt presents to discuss options for weight loss. She has tried and failed multiple weight loss medications including contrave and qysmia. She is diligent about going to the gym and exercising for more than 6 months. She has also met with a nutritionist and has been implementing changes. She has not had much benefit. I do believe pt would be a perfect candidate for zepound. She has a hx of  anxiety, MDD, and arthritis all secondary to weight gain.

## 2023-12-01 NOTE — Progress Notes (Signed)
Established patient visit   Patient: Madison [name]   DOB: [birth year]   43 y.o. Female  MRN: [id number] Visit Date: 12/01/2023  Today's healthcare provider: Charlton Amor, DO   Chief Complaint  Patient presents with   Obesity    SUBJECTIVE    Chief Complaint  Patient presents with   Obesity   HPI  Pt presents for weight management concerns. She has extensively done diet and exercise modifications for 6 months with no improvement in weight.   Review of Systems  Constitutional:  Negative for activity change, fatigue and fever.  Respiratory:  Negative for cough and shortness of breath.   Cardiovascular:  Negative for chest pain.  Gastrointestinal:  Negative for abdominal pain.  Genitourinary:  Negative for difficulty urinating.       Current Meds  Medication Sig   acetaminophen (TYLENOL) 500 MG tablet Take 2 tablets (1,000 mg total) by mouth every 8 (eight) hours for 14 days.   aspirin (ASPIRIN CHILDRENS) 81 MG chewable tablet Chew 1 tablet (81 mg total) by mouth 2 (two) times daily. For 6 weeks for DVT prophylaxis after surgery   busPIRone (BUSPAR) 15 MG tablet Take 1 tablet (15 mg total) by mouth 2 (two) times daily as needed (anxiety).   celecoxib (CELEBREX) 200 MG capsule Take 1 capsule (200 mg total) by mouth 2 (two) times daily.   Cetirizine HCl (ZYRTEC PO) Take by mouth.   cyclobenzaprine (FLEXERIL) 10 MG tablet Take 0.5-1 tablets (5-10 mg total) by mouth at bedtime.   diltiazem (CARDIZEM CD) 300 MG 24 hr capsule Take 1 capsule (300 mg total) by mouth daily.   EPINEPHrine 0.3 mg/0.3 mL IJ SOAJ injection Inject as directed in emergency situation for severe allergic reaction   escitalopram (LEXAPRO) 10 MG tablet Take 1 tablet (10 mg total) by mouth daily.   levothyroxine (SYNTHROID) 112 MCG tablet Take 1 tablet (112 mcg total) by mouth daily before breakfast.   Norgestimate-Ethinyl Estradiol Triphasic (TRI-VYLIBRA) 0.18/0.215/0.25 MG-35 MCG tablet Take 1 tablet by  mouth daily.   ondansetron (ZOFRAN) 4 MG tablet Take 1 tablet (4 mg total) by mouth every 8 (eight) hours as needed for up to 7 days for nausea or vomiting.   tirzepatide (ZEPBOUND) 2.5 MG/0.5ML Pen Inject 2.5 mg into the skin once a week.   traMADol (ULTRAM) 50 MG tablet Take 1 tablet (50 mg total) by mouth every 6 (six) hours as needed for severe pain (pain score 7-10).    OBJECTIVE    BP (!) 126/92 (BP Location: Left Arm, Patient Position: Sitting, Cuff Size: Large)   Pulse 97   Ht 5\' 5"  (1.651 m)   Wt 235 lb (106.6 kg)   LMP 11/12/2023 (Exact Date) Comment: negative UPT 11/25/23  SpO2 100%   BMI 39.11 kg/m   Physical Exam Vitals reviewed.  Constitutional:      Appearance: She is well-developed.  HENT:     Head: Normocephalic and atraumatic.  Eyes:     Conjunctiva/sclera: Conjunctivae normal.  Cardiovascular:     Rate and Rhythm: Normal rate.  Pulmonary:     Effort: Pulmonary effort is normal.  Skin:    General: Skin is dry.     Coloration: Skin is not pale.  Neurological:     Mental Status: She is alert and oriented to person, place, and time.  Psychiatric:        Behavior: Behavior normal.        ASSESSMENT & PLAN  Problem List Items Addressed This Visit       Other   Class 2 severe obesity due to excess calories with serious comorbidity and body mass index (BMI) of 39.0 to 39.9 in adult [facility]) - Primary   Pt presents to discuss options for weight loss. She has tried and failed multiple weight loss medications including contrave and qysmia. She is diligent about going to the gym and exercising for more than 6 months. She has also met with a nutritionist and has been implementing changes. She has not had much benefit. I do believe pt would be a perfect candidate for zepound. She has a hx of  anxiety, MDD, and arthritis all secondary to weight gain.       Relevant Medications   tirzepatide (ZEPBOUND) 2.5 MG/0.5ML Pen    No follow-ups on file.      Meds  ordered this encounter  Medications   tirzepatide (ZEPBOUND) 2.5 MG/0.5ML Pen    Sig: Inject 2.5 mg into the skin once a week.    Dispense:  2 mL    Refill:  0    No orders of the defined types were placed in this encounter.    Charlton Amor, DO  [facility] Primary Care & Sports Medicine at [facility] [phone] (phone) [phone] (fax)  [facility]

## 2023-12-03 ENCOUNTER — Encounter (HOSPITAL_COMMUNITY): Payer: Self-pay

## 2023-12-03 ENCOUNTER — Other Ambulatory Visit (HOSPITAL_COMMUNITY): Payer: Self-pay

## 2023-12-03 ENCOUNTER — Encounter: Payer: Self-pay | Admitting: Family Medicine

## 2023-12-06 ENCOUNTER — Ambulatory Visit: Payer: Commercial Managed Care - PPO | Admitting: Family Medicine

## 2023-12-06 DIAGNOSIS — M25661 Stiffness of right knee, not elsewhere classified: Secondary | ICD-10-CM | POA: Diagnosis not present

## 2023-12-06 DIAGNOSIS — M6281 Muscle weakness (generalized): Secondary | ICD-10-CM | POA: Diagnosis not present

## 2023-12-06 DIAGNOSIS — S83231D Complex tear of medial meniscus, current injury, right knee, subsequent encounter: Secondary | ICD-10-CM | POA: Diagnosis not present

## 2023-12-13 ENCOUNTER — Other Ambulatory Visit (HOSPITAL_COMMUNITY): Payer: Self-pay

## 2023-12-13 DIAGNOSIS — M6281 Muscle weakness (generalized): Secondary | ICD-10-CM | POA: Diagnosis not present

## 2023-12-13 DIAGNOSIS — M25661 Stiffness of right knee, not elsewhere classified: Secondary | ICD-10-CM | POA: Diagnosis not present

## 2023-12-13 DIAGNOSIS — S83231D Complex tear of medial meniscus, current injury, right knee, subsequent encounter: Secondary | ICD-10-CM | POA: Diagnosis not present

## 2023-12-28 DIAGNOSIS — M25661 Stiffness of right knee, not elsewhere classified: Secondary | ICD-10-CM | POA: Diagnosis not present

## 2023-12-28 DIAGNOSIS — M6281 Muscle weakness (generalized): Secondary | ICD-10-CM | POA: Diagnosis not present

## 2023-12-28 DIAGNOSIS — S83231D Complex tear of medial meniscus, current injury, right knee, subsequent encounter: Secondary | ICD-10-CM | POA: Diagnosis not present

## 2024-01-12 ENCOUNTER — Encounter: Payer: Self-pay | Admitting: Sports Medicine

## 2024-01-12 ENCOUNTER — Telehealth: Payer: Self-pay | Admitting: Sports Medicine

## 2024-01-12 NOTE — Telephone Encounter (Signed)
 Bilateral x-ray confirmed knee osteoarthritis, failed steroid injections, physical therapy, surgery, bilateral Visco approval please

## 2024-01-13 NOTE — Telephone Encounter (Addendum)
 Benefits Investigation Details received from MyVisco Injection: Orthovisc PA required: yes  May fill through: [employer]  and Psychologist, occupational both are acceptable  OV Copay/Coinsurance: n/a Product Copay: $50 Administration Coinsurance: n/a Administration Copay: n/a Deductible: $600 ($300 met) individual    Psychologist, forensic available  through medical benefits** : [facility]   Medical: Deductible applies. Once the deductible  has been met, patient  is responsible  for a co-pay.Once  the OOP  has been met, patient will be covered at 100%. Only  one co-pay  per date  of service,   Prior authorization for the drug is required by [employer]. Faxed completed form to [phone]   Prior authorization UJW:[id number] FAO:ZHYQM Monia Pouch  Case/Key:[id number] Status: approved  as of   01/14/24 Reason:approved through 01/13/24-01/11/25 Notified Pt via: Mychart   Called pt to notify of approval awaiting response

## 2024-01-14 DIAGNOSIS — M6281 Muscle weakness (generalized): Secondary | ICD-10-CM | POA: Diagnosis not present

## 2024-01-14 DIAGNOSIS — S83231D Complex tear of medial meniscus, current injury, right knee, subsequent encounter: Secondary | ICD-10-CM | POA: Diagnosis not present

## 2024-01-14 DIAGNOSIS — M25661 Stiffness of right knee, not elsewhere classified: Secondary | ICD-10-CM | POA: Diagnosis not present

## 2024-01-20 DIAGNOSIS — M6281 Muscle weakness (generalized): Secondary | ICD-10-CM | POA: Diagnosis not present

## 2024-01-20 DIAGNOSIS — M25661 Stiffness of right knee, not elsewhere classified: Secondary | ICD-10-CM | POA: Diagnosis not present

## 2024-01-20 DIAGNOSIS — S83231D Complex tear of medial meniscus, current injury, right knee, subsequent encounter: Secondary | ICD-10-CM | POA: Diagnosis not present

## 2024-02-12 ENCOUNTER — Encounter (HOSPITAL_COMMUNITY): Payer: Self-pay

## 2024-02-13 ENCOUNTER — Other Ambulatory Visit: Payer: Self-pay | Admitting: Sports Medicine

## 2024-02-13 ENCOUNTER — Other Ambulatory Visit (HOSPITAL_COMMUNITY): Payer: Self-pay

## 2024-02-13 DIAGNOSIS — M25561 Pain in right knee: Secondary | ICD-10-CM

## 2024-02-14 ENCOUNTER — Other Ambulatory Visit (HOSPITAL_COMMUNITY): Payer: Self-pay

## 2024-02-17 ENCOUNTER — Encounter (INDEPENDENT_AMBULATORY_CARE_PROVIDER_SITE_OTHER): Payer: Self-pay | Admitting: Sports Medicine

## 2024-02-17 ENCOUNTER — Other Ambulatory Visit (HOSPITAL_COMMUNITY): Payer: Self-pay

## 2024-02-17 DIAGNOSIS — S83203D Other tear of unspecified meniscus, current injury, right knee, subsequent encounter: Secondary | ICD-10-CM | POA: Diagnosis not present

## 2024-02-17 MED ORDER — TRAMADOL HCL 50 MG PO TABS
50.0000 mg | ORAL_TABLET | Freq: Two times a day (BID) | ORAL | 0 refills | Status: DC | PRN
  Filled 2024-02-17: qty 30, 15d supply, fill #0

## 2024-02-17 NOTE — Telephone Encounter (Signed)

## 2024-02-19 ENCOUNTER — Other Ambulatory Visit (HOSPITAL_COMMUNITY): Payer: Self-pay

## 2024-02-28 ENCOUNTER — Other Ambulatory Visit (INDEPENDENT_AMBULATORY_CARE_PROVIDER_SITE_OTHER)

## 2024-02-28 ENCOUNTER — Encounter: Payer: Self-pay | Admitting: Sports Medicine

## 2024-02-28 ENCOUNTER — Ambulatory Visit: Admitting: Sports Medicine

## 2024-02-28 DIAGNOSIS — M170 Bilateral primary osteoarthritis of knee: Secondary | ICD-10-CM

## 2024-02-28 MED ORDER — HYALURONAN 30 MG/2ML IX SOSY
30.0000 mg | PREFILLED_SYRINGE | Freq: Once | INTRA_ARTICULAR | Status: AC
Start: 2024-02-28 — End: 2024-02-28
  Administered 2024-02-28: 30 mg via INTRA_ARTICULAR

## 2024-02-28 NOTE — Addendum Note (Signed)
 Addended by: OLIVA-AVELLANEDA, Kazi Reppond L on: 02/28/2024 10:27 AM   Modules accepted: Orders

## 2024-02-28 NOTE — Assessment & Plan Note (Signed)
Orthovisc 1 of 4 both knees, return in 1 week for #2 of 4 

## 2024-02-28 NOTE — Progress Notes (Signed)
    Procedures performed today:    Procedure: Real-time Ultrasound Guided injection of the left knee Device: Samsung HS60  Verbal informed consent obtained.  Time-out conducted.  Noted no overlying erythema, induration, or other signs of local infection.  Skin prepped in a sterile fashion.  Local anesthesia: Topical Ethyl chloride.  With sterile technique and under real time ultrasound guidance: Trace effusion noted, 30 mg/2 mL of OrthoVisc (sodium hyaluronate) in a prefilled syringe was injected easily into the knee through a 22-gauge needle.   Completed without difficulty  Advised to call if fevers/chills, erythema, induration, drainage, or persistent bleeding.  Images permanently stored and available for review in PACS.  Impression: Technically successful ultrasound guided injection.   Procedure: Real-time Ultrasound Guided injection of the right knee Device: Samsung HS60  Verbal informed consent obtained.  Time-out conducted.  Noted no overlying erythema, induration, or other signs of local infection.  Skin prepped in a sterile fashion.  Local anesthesia: Topical Ethyl chloride.  With sterile technique and under real time ultrasound guidance: Trace effusion noted, 30 mg/2 mL of OrthoVisc (sodium hyaluronate) in a prefilled syringe was injected easily into the knee through a 22-gauge needle.   Completed without difficulty  Advised to call if fevers/chills, erythema, induration, drainage, or persistent bleeding.  Images permanently stored and available for review in PACS.  Impression: Technically successful ultrasound guided injection.  Independent interpretation of notes and tests performed by another provider:   None.  Brief History, Exam, Impression, and Recommendations:    Primary osteoarthritis of both knees Orthovisc 1 of 4 both knees, return in 1 week for #2 of 4.    ____________________________________________ Joselyn Nicely. Sandy Crumb, M.D., ABFM., CAQSM.,  AME. Primary Care and Sports Medicine [facility]  Adjunct Professor of [facility] Medicine  [employer]  Restaurant manager, fast food

## 2024-03-07 ENCOUNTER — Encounter: Payer: Self-pay | Admitting: Sports Medicine

## 2024-03-07 ENCOUNTER — Other Ambulatory Visit (INDEPENDENT_AMBULATORY_CARE_PROVIDER_SITE_OTHER)

## 2024-03-07 ENCOUNTER — Encounter

## 2024-03-07 ENCOUNTER — Ambulatory Visit: Admitting: Sports Medicine

## 2024-03-07 ENCOUNTER — Telehealth: Admitting: Family Medicine

## 2024-03-07 DIAGNOSIS — M170 Bilateral primary osteoarthritis of knee: Secondary | ICD-10-CM

## 2024-03-07 DIAGNOSIS — U071 COVID-19: Secondary | ICD-10-CM

## 2024-03-07 MED ORDER — HYALURONAN 30 MG/2ML IX SOSY
30.0000 mg | PREFILLED_SYRINGE | Freq: Once | INTRA_ARTICULAR | Status: AC
  Administered 2024-03-07: 30 mg via INTRA_ARTICULAR

## 2024-03-07 NOTE — Progress Notes (Signed)
 E-Visit  for Positive Covid Test Result   We are sorry you are not feeling well. We are here to help!  You have tested positive for COVID-19, meaning that you were infected with the novel coronavirus and could give the virus to others.  Most people with COVID-19 have mild illness and can recover at home without medical care. Do not leave your home, except to get medical care. Do not visit public areas and do not go to places where you are unable to wear a mask. It is important that you stay home  to take care for yourself and to help protect other people in your home and community.      Isolation Instructions:   You are to isolate at home until you have been fever free for at least 24 hours without a fever-reducing medication, and symptoms have been steadily improving for 24 hours. At that time,  you can end isolation but need to mask for an additional 5 days.  If you must be around other household members who do not have symptoms, you need to make sure that both you and the family members are masking consistently with a high-quality mask.  If you note any worsening of symptoms despite treatment, please seek an in-person evaluation ASAP. If you note any significant shortness of breath or any chest pain, please seek ER evaluation. Please do not delay care!   Go to the nearest hospital ED for assessment if fever/cough/breathlessness are severe or illness seems like a threat to life.    The following symptoms may appear 2-14 days after exposure: Fever Cough Shortness of breath or difficulty breathing Chills Repeated shaking with chills Muscle pain Headache Sore throat New loss of taste or smell Fatigue Congestion or runny nose Nausea or vomiting Diarrhea  You can use medication such as Ibuprofen for sore throat, salt water gargles.   You may also take acetaminophen  (Tylenol ) as needed for fever.  HOME CARE: Only take medications as instructed by your medical team. Drink plenty  of fluids and get plenty of rest. A steam or ultrasonic humidifier can help if you have congestion.   GET HELP RIGHT AWAY IF YOU HAVE EMERGENCY WARNING SIGNS.  Call 911 or proceed to your closest emergency facility if: You develop worsening high fever. Trouble breathing Bluish lips or face Persistent pain or pressure in the chest New confusion Inability to wake or stay awake You cough up blood. Your symptoms become more severe Inability to hold down food or fluids  This list is not all possible symptoms. Contact your medical provider for any symptoms that are severe or concerning to you.   Your e-visit answers were reviewed by a board certified advanced clinical practitioner to complete your personal care plan.  Depending on the condition, your plan could have included both over the counter or prescription medications.  If there is a problem please reply once you have received a response from your provider.  Your safety is important to us .  If you have drug allergies check your prescription carefully.    You can use MyChart to ask questions about today's visit, request a non-urgent call back, or ask for a work or school excuse for 24 hours related to this e-Visit. If it has been greater than 24 hours you will need to follow up with your provider, or enter a new e-Visit to address those concerns. You will get an e-mail in the next two days asking about your experience.  I hope  that your e-visit has been valuable and will speed your recovery. Thank you for using e-visits.     I provided 5 minutes of non face-to-face time during this encounter for chart review, medication and order placement, as well as and documentation.

## 2024-03-07 NOTE — Progress Notes (Signed)
    Procedures performed today:    Procedure: Real-time Ultrasound Guided injection of the left knee Device: Samsung HS60  Verbal informed consent obtained.  Time-out conducted.  Noted no overlying erythema, induration, or other signs of local infection.  Skin prepped in a sterile fashion.  Local anesthesia: Topical Ethyl chloride.  With sterile technique and under real time ultrasound guidance: Trace effusion noted, 30 mg/2 mL of OrthoVisc (sodium hyaluronate) in a prefilled syringe was injected easily into the knee through a 22-gauge needle.   Completed without difficulty  Advised to call if fevers/chills, erythema, induration, drainage, or persistent bleeding.  Images permanently stored and available for review in PACS.  Impression: Technically successful ultrasound guided injection.   Procedure: Real-time Ultrasound Guided injection of the right knee Device: Samsung HS60  Verbal informed consent obtained.  Time-out conducted.  Noted no overlying erythema, induration, or other signs of local infection.  Skin prepped in a sterile fashion.  Local anesthesia: Topical Ethyl chloride.  With sterile technique and under real time ultrasound guidance: Trace effusion noted, 30 mg/2 mL of OrthoVisc (sodium hyaluronate) in a prefilled syringe was injected easily into the knee through a 22-gauge needle.   Completed without difficulty  Advised to call if fevers/chills, erythema, induration, drainage, or persistent bleeding.  Images permanently stored and available for review in PACS.  Impression: Technically successful ultrasound guided injection  Independent interpretation of notes and tests performed by another provider:   None.  Brief History, Exam, Impression, and Recommendations:    Primary osteoarthritis of both knees Orthovisc 2 of 4 both knees, return in 1 week for #3-4.    ____________________________________________ Joselyn Nicely. Sandy Crumb, M.D., ABFM., CAQSM., AME. Primary  Care and Sports Medicine [facility] [facility]  Adjunct Professor of Family Medicine  [employer]  Restaurant manager, fast food

## 2024-03-07 NOTE — Assessment & Plan Note (Signed)
 Orthovisc 2 of 4 both knees, return in 1 week for #3-4.

## 2024-03-14 ENCOUNTER — Ambulatory Visit: Admitting: Sports Medicine

## 2024-03-14 ENCOUNTER — Other Ambulatory Visit (INDEPENDENT_AMBULATORY_CARE_PROVIDER_SITE_OTHER)

## 2024-03-14 DIAGNOSIS — M170 Bilateral primary osteoarthritis of knee: Secondary | ICD-10-CM

## 2024-03-14 MED ORDER — HYALURONAN 30 MG/2ML IX SOSY
30.0000 mg | PREFILLED_SYRINGE | Freq: Once | INTRA_ARTICULAR | Status: AC
Start: 2024-03-14 — End: 2024-03-14
  Administered 2024-03-14: 30 mg via INTRA_ARTICULAR

## 2024-03-14 NOTE — Addendum Note (Signed)
 Addended by: Montgomery Apgar on: 03/14/2024 11:37 AM   Modules accepted: Orders

## 2024-03-14 NOTE — Progress Notes (Signed)
    Procedures performed today:    Procedure: Real-time Ultrasound Guided injection of the left knee Device: Samsung HS60  Verbal informed consent obtained.  Time-out conducted.  Noted no overlying erythema, induration, or other signs of local infection.  Skin prepped in a sterile fashion.  Local anesthesia: Topical Ethyl chloride.  With sterile technique and under real time ultrasound guidance: Trace effusion noted, 30 mg/2 mL of OrthoVisc (sodium hyaluronate) in a prefilled syringe was injected easily into the knee through a 22-gauge needle.   Completed without difficulty  Advised to call if fevers/chills, erythema, induration, drainage, or persistent bleeding.  Images permanently stored and available for review in PACS.  Impression: Technically successful ultrasound guided injection.   Procedure: Real-time Ultrasound Guided injection of the right knee Device: Samsung HS60  Verbal informed consent obtained.  Time-out conducted.  Noted no overlying erythema, induration, or other signs of local infection.  Skin prepped in a sterile fashion.  Local anesthesia: Topical Ethyl chloride.  With sterile technique and under real time ultrasound guidance: Trace effusion noted, 30 mg/2 mL of OrthoVisc (sodium hyaluronate) in a prefilled syringe was injected easily into the knee through a 22-gauge needle.   Completed without difficulty  Advised to call if fevers/chills, erythema, induration, drainage, or persistent bleeding.  Images permanently stored and available for review in PACS.  Impression: Technically successful ultrasound guided injection  Independent interpretation of notes and tests performed by another provider:   None.  Brief History, Exam, Impression, and Recommendations:    Primary osteoarthritis of both knees Orthovisc 3 of 4 both knees.    ____________________________________________ Joselyn Nicely. Sandy Crumb, M.D., ABFM., CAQSM., AME. Primary Care and Sports  Medicine [facility]  Adjunct Professor of [facility] Medicine  [employer]  Restaurant manager, fast food

## 2024-03-14 NOTE — Assessment & Plan Note (Signed)
 Orthovisc 3 of 4 both knees.

## 2024-03-15 ENCOUNTER — Other Ambulatory Visit: Payer: Self-pay | Admitting: Sports Medicine

## 2024-03-15 ENCOUNTER — Encounter (HOSPITAL_COMMUNITY): Payer: Self-pay

## 2024-03-15 ENCOUNTER — Other Ambulatory Visit (HOSPITAL_COMMUNITY): Payer: Self-pay

## 2024-03-15 ENCOUNTER — Other Ambulatory Visit: Payer: Self-pay

## 2024-03-15 DIAGNOSIS — S83203D Other tear of unspecified meniscus, current injury, right knee, subsequent encounter: Secondary | ICD-10-CM

## 2024-03-15 MED ORDER — TRAMADOL HCL 50 MG PO TABS
50.0000 mg | ORAL_TABLET | Freq: Two times a day (BID) | ORAL | 0 refills | Status: DC | PRN
  Filled 2024-03-15: qty 30, 15d supply, fill #0

## 2024-03-16 ENCOUNTER — Encounter: Payer: Self-pay | Admitting: Family Medicine

## 2024-03-20 ENCOUNTER — Other Ambulatory Visit (HOSPITAL_COMMUNITY): Payer: Self-pay

## 2024-03-21 ENCOUNTER — Ambulatory Visit: Admitting: Sports Medicine

## 2024-03-21 ENCOUNTER — Encounter: Payer: Self-pay | Admitting: Sports Medicine

## 2024-03-21 ENCOUNTER — Other Ambulatory Visit (INDEPENDENT_AMBULATORY_CARE_PROVIDER_SITE_OTHER)

## 2024-03-21 ENCOUNTER — Other Ambulatory Visit (HOSPITAL_COMMUNITY): Payer: Self-pay

## 2024-03-21 DIAGNOSIS — M170 Bilateral primary osteoarthritis of knee: Secondary | ICD-10-CM

## 2024-03-21 MED ORDER — HYALURONAN 30 MG/2ML IX SOSY
30.0000 mg | PREFILLED_SYRINGE | Freq: Once | INTRA_ARTICULAR | Status: AC
  Administered 2024-03-21: 30 mg via INTRA_ARTICULAR

## 2024-03-21 NOTE — Progress Notes (Signed)
    Procedures performed today:    Procedure: Real-time Ultrasound Guided injection of the left knee Device: Samsung HS60  Verbal informed consent obtained.  Time-out conducted.  Noted no overlying erythema, induration, or other signs of local infection.  Skin prepped in a sterile fashion.  Local anesthesia: Topical Ethyl chloride.  With sterile technique and under real time ultrasound guidance: Trace effusion noted, 30 mg/2 mL of OrthoVisc (sodium hyaluronate) in a prefilled syringe was injected easily into the knee through a 22-gauge needle.   Completed without difficulty  Advised to call if fevers/chills, erythema, induration, drainage, or persistent bleeding.  Images permanently stored and available for review in PACS.  Impression: Technically successful ultrasound guided injection.   Procedure: Real-time Ultrasound Guided injection of the right knee Device: Samsung HS60  Verbal informed consent obtained.  Time-out conducted.  Noted no overlying erythema, induration, or other signs of local infection.  Skin prepped in a sterile fashion.  Local anesthesia: Topical Ethyl chloride.  With sterile technique and under real time ultrasound guidance: Trace effusion noted, 30 mg/2 mL of OrthoVisc (sodium hyaluronate) in a prefilled syringe was injected easily into the knee through a 22-gauge needle.   Completed without difficulty  Advised to call if fevers/chills, erythema, induration, drainage, or persistent bleeding.  Images permanently stored and available for review in PACS.  Impression: Technically successful ultrasound guided injection  Independent interpretation of notes and tests performed by another provider:   None.  Brief History, Exam, Impression, and Recommendations:    Primary osteoarthritis of both knees Orthovisc 4 of 4 both knees. Minimal relief. Return in 6 weeks to reevaluate.    ____________________________________________ Madison [name]. Sandy Crumb, M.D.,  ABFM., CAQSM., AME. Primary Care and Sports Medicine [place] [facility]  Adjunct Professor of [facility] Medicine  [employer]  Restaurant manager, fast food

## 2024-03-21 NOTE — Assessment & Plan Note (Addendum)
 Orthovisc 4 of 4 both knees. Minimal relief. Return in 6 weeks to reevaluate.

## 2024-04-22 ENCOUNTER — Other Ambulatory Visit: Payer: Self-pay | Admitting: Sports Medicine

## 2024-04-22 ENCOUNTER — Other Ambulatory Visit (HOSPITAL_COMMUNITY): Payer: Self-pay

## 2024-04-22 ENCOUNTER — Other Ambulatory Visit (HOSPITAL_BASED_OUTPATIENT_CLINIC_OR_DEPARTMENT_OTHER): Payer: Self-pay

## 2024-04-22 DIAGNOSIS — S83203D Other tear of unspecified meniscus, current injury, right knee, subsequent encounter: Secondary | ICD-10-CM

## 2024-04-24 ENCOUNTER — Other Ambulatory Visit: Payer: Self-pay

## 2024-04-24 ENCOUNTER — Other Ambulatory Visit (HOSPITAL_COMMUNITY): Payer: Self-pay

## 2024-04-24 MED ORDER — TRAMADOL HCL 50 MG PO TABS
50.0000 mg | ORAL_TABLET | Freq: Two times a day (BID) | ORAL | 0 refills | Status: DC | PRN
  Filled 2024-04-24: qty 30, 15d supply, fill #0

## 2024-05-03 ENCOUNTER — Ambulatory Visit: Admitting: Sports Medicine

## 2024-05-03 ENCOUNTER — Other Ambulatory Visit (HOSPITAL_COMMUNITY): Payer: Self-pay

## 2024-05-03 DIAGNOSIS — M170 Bilateral primary osteoarthritis of knee: Secondary | ICD-10-CM

## 2024-05-03 MED ORDER — IBUPROFEN 800 MG PO TABS
800.0000 mg | ORAL_TABLET | Freq: Three times a day (TID) | ORAL | 2 refills | Status: DC | PRN
  Filled 2024-05-03: qty 90, 30d supply, fill #0

## 2024-05-03 NOTE — Progress Notes (Signed)
    Procedures performed today:    None.  Independent interpretation of notes and tests performed by another provider:   None.  Brief History, Exam, Impression, and Recommendations:    Primary osteoarthritis of both knees Primary osteoarthritis of both knees, she did have medial meniscal tearing right knee, status post arthroscopic debridement. We have done steroid injections, Celebrex , she finished Orthovisc last month. Left knee is doing okay, right knee continues to hurt mostly medial. Celebrex  causes some GI upset but she notes that ibuprofen has not interestingly, so we will discontinue Celebrex , switch to prescription strength ibuprofen, will do this for 2 weeks, if she does not note really good improvements in her pain we will switch to PRP for the right knee. She understands she needs to be off of NSAIDs for 2 weeks prior to PRP.    ____________________________________________ Debby PARAS. Curtis, M.D., ABFM., CAQSM., AME. Primary Care and Sports Medicine  [facility]  Adjunct Professor of [facility] Medicine  [employer]  Restaurant manager, fast food

## 2024-05-03 NOTE — Patient Instructions (Signed)
 Platelet-rich plasma is used in musculoskeletal medicine to focus your own body's ability to heal. It has several well-done published randomized control trials (RCT) which demonstrate both its effectiveness and safety in many musculoskeletal conditions, including osteoarthritis, tendinopathies, and damaged vertebral discs. PRP has been in clinical use since the 1990's. Many people know that platelets form a clot if there is a cut in the skin. It turns out that platelets do not only form a clot, they also start the body's own repair process. When platelets activate to form a clot, they also release alpha granules which have hundreds of chemical messengers in them that initiate and organize repair to the damaged tissue. Precisely placing PRP into the site of injury will initiate the healing process by activating on the damaged cartilage or tendon. This is an inflammatory process, and inflammation is the vital first phase of Healing.  What to expect and how to prepare for PRP   2 weeks prior to the procedure: depending on the procedure, you may need to arrange for a driver to bring you home. IF you are having a lower extremity procedure, we can provide crutches as needed.   7 days prior to the procedure: Stop taking anti-inflammatory drugs like ibuprofen, Naprosyn, Celebrex, or Meloxicam. Let Dr. Benjamin [name] know if you have been taking prednisone or other corticosteroids in the last month.   The day before the procedure: thoroughly shower and clean your skin.    The day of the procedure: Wear loose-fitting clothing like sweatpants or shorts. If you are having an upper body procedure wear a top that can button or zip up.  PRP will initiate healing and a productive inflammation, and PRP therapy will make the body part treated sore for 4 days to two weeks. Anti-inflammatory drugs (i.e. ibuprofen, Naprosyn, Celebrex) and corticosteroids such as prednisone can blunt or stop this process,  so it is important to not take any anti-inflammatory drugs for 7 days before getting PRP therapy, or for at least three weeks after PRP therapy. Corticosteroid injections can blunt inflammation for 30 days, so let us know if you have had one recently. Depending on the body part injected, you may be in a sling or on crutches for several days. Just like wringing out a wet dishcloth, if you load or tense a tendon or ligament that has just been injected with PRP, some of the PRP injected will squish out. By keeping the body part treated relaxed by using a sling (for the shoulder or arm) or crutches (for hips and legs) for a few days, the PRP can bind in place and do its job.   You may need a driver to bring you home.  Tobacco/nicotine is a potent toxin and its use constricts small blood vessels which are needed for tissue repair.  Tobacco/nicotine use will limit the effectiveness of any treatment and stopping tobacco use is one of the single  greatest actions you can take to improve your health. Avoid toxins like alcohol, which inhibits and depresses the cells needed for tissue repair.  What happens during the PRP procedure?  Platelet rich plasma is made by taking some of your blood and performing a two-stage centrifuge process on it to concentrate the PRP. First, your blood is drawn into a syringe with a small amount of anti-coagulant in it (this is to keep the blood from clotting during this process). The amount of blood drawn is usually about 10-30 milliliters, depending on how much PRP is needed for  the treatment.  (There are 355 milliliters in a 12-ounce soda can for comparison).  Then the blood is transferred in a sterile fashion into a centrifuge tube. It is then centrifuged for the first cycle where the red blood cells are isolated and discarded. In the second centrifuge cycle, the platelet-rich fraction of the remaining plasma is concentrated and placed in a syringe. The skin at the  injection site is numbed with a small amount of topical cooling spray. Dr Madison [name] will then precisely inject the PRP into the injury site using ultrasound guidance.  What to do after your procedure  I will give you specific medicine to control any discomfort you may have after the procedure. Avoid NSAIDs like ibuprofen. Acetaminophen can be used for mild pain.  Depending on the part of the body treated, usually you will be placed in a sling or on crutches for 1 to 3 days. Do your best not to tense or load the treated area during this time. After 3 days, unless otherwise instructed, the treated body part should be used and slowly moved through its full range of motion. It will be sore, but you will not be doing damage by moving it, in fact it needs to move to heal. If you were on crutches for a period of time, walking is ok once you are off the crutches. For now, avoid activities that specifically hurt you before being treated. Exercise is vital to good health and finding a way to cross train around your injury is important not only for your physical health, but for your mental health as well. Ask me about cross training options for your injury. Some brief (10 minutes or less) period of heat or ice therapy will not hurt the therapy, but it is not required. Usually, depending on the initial injury, physical therapy is started from two weeks to four weeks after injection. Improvements in pain and function should be expected from 8 weeks to 12 weeks after injection and some injuries may require more than one treatment.    ____________________________________________ Madison [name]. Madison [name], M.D., ABFM., CAQSM., AME. Primary Care and Sports Medicine [place] [facility]  Adjunct Professor of Family Medicine  [employer]  Restaurant manager, fast food

## 2024-05-03 NOTE — Assessment & Plan Note (Signed)
 Primary osteoarthritis of both knees, she did have medial meniscal tearing right knee, status post arthroscopic debridement. We have done steroid injections, Celebrex , she finished Orthovisc last month. Left knee is doing okay, right knee continues to hurt mostly medial. Celebrex  causes some GI upset but she notes that ibuprofen has not interestingly, so we will discontinue Celebrex , switch to prescription strength ibuprofen, will do this for 2 weeks, if she does not note really good improvements in her pain we will switch to PRP for the right knee. She understands she needs to be off of NSAIDs for 2 weeks prior to PRP.

## 2024-05-04 ENCOUNTER — Encounter: Payer: Self-pay | Admitting: Sports Medicine

## 2024-05-26 ENCOUNTER — Other Ambulatory Visit (HOSPITAL_COMMUNITY): Payer: Self-pay

## 2024-05-26 ENCOUNTER — Other Ambulatory Visit: Payer: Self-pay | Admitting: Sports Medicine

## 2024-05-26 ENCOUNTER — Other Ambulatory Visit: Payer: Self-pay

## 2024-05-26 ENCOUNTER — Other Ambulatory Visit (HOSPITAL_BASED_OUTPATIENT_CLINIC_OR_DEPARTMENT_OTHER): Payer: Self-pay

## 2024-05-26 DIAGNOSIS — S83203D Other tear of unspecified meniscus, current injury, right knee, subsequent encounter: Secondary | ICD-10-CM

## 2024-05-26 MED ORDER — TRAMADOL HCL 50 MG PO TABS
50.0000 mg | ORAL_TABLET | Freq: Two times a day (BID) | ORAL | 0 refills | Status: DC | PRN
  Filled 2024-05-26: qty 30, 15d supply, fill #0

## 2024-05-29 ENCOUNTER — Other Ambulatory Visit (HOSPITAL_COMMUNITY): Payer: Self-pay

## 2024-05-29 ENCOUNTER — Ambulatory Visit (INDEPENDENT_AMBULATORY_CARE_PROVIDER_SITE_OTHER): Admitting: Urgent Care

## 2024-05-29 ENCOUNTER — Encounter: Payer: Self-pay | Admitting: Urgent Care

## 2024-05-29 VITALS — BP 121/83 | HR 87 | Resp 18 | Ht 65.0 in | Wt 203.2 lb

## 2024-05-29 DIAGNOSIS — Z1231 Encounter for screening mammogram for malignant neoplasm of breast: Secondary | ICD-10-CM | POA: Diagnosis not present

## 2024-05-29 DIAGNOSIS — Z0000 Encounter for general adult medical examination without abnormal findings: Secondary | ICD-10-CM | POA: Diagnosis not present

## 2024-05-29 DIAGNOSIS — M170 Bilateral primary osteoarthritis of knee: Secondary | ICD-10-CM

## 2024-05-29 DIAGNOSIS — Z114 Encounter for screening for human immunodeficiency virus [HIV]: Secondary | ICD-10-CM | POA: Diagnosis not present

## 2024-05-29 DIAGNOSIS — Z1159 Encounter for screening for other viral diseases: Secondary | ICD-10-CM

## 2024-05-29 DIAGNOSIS — Z308 Encounter for other contraceptive management: Secondary | ICD-10-CM | POA: Diagnosis not present

## 2024-05-29 DIAGNOSIS — E559 Vitamin D deficiency, unspecified: Secondary | ICD-10-CM

## 2024-05-29 DIAGNOSIS — E063 Autoimmune thyroiditis: Secondary | ICD-10-CM | POA: Diagnosis not present

## 2024-05-29 MED ORDER — IBUPROFEN 800 MG PO TABS
800.0000 mg | ORAL_TABLET | Freq: Three times a day (TID) | ORAL | 2 refills | Status: AC | PRN
  Filled 2024-05-29: qty 90, 30d supply, fill #0
  Filled 2024-08-07: qty 90, 30d supply, fill #1
  Filled 2024-10-01: qty 90, 30d supply, fill #2

## 2024-05-29 MED ORDER — NORGESTIM-ETH ESTRAD TRIPHASIC 0.18/0.215/0.25 MG-35 MCG PO TABS
1.0000 | ORAL_TABLET | Freq: Every day | ORAL | 11 refills | Status: AC
  Filled 2024-05-29 – 2024-06-12 (×3): qty 28, 28d supply, fill #0
  Filled 2024-07-07: qty 28, 28d supply, fill #1
  Filled 2024-08-05: qty 28, 28d supply, fill #2
  Filled 2024-09-02: qty 28, 28d supply, fill #3
  Filled 2024-09-30: qty 28, 28d supply, fill #4
  Filled 2024-10-27: qty 84, 84d supply, fill #5

## 2024-05-29 MED ORDER — EPINEPHRINE 0.3 MG/0.3ML IJ SOAJ
INTRAMUSCULAR | 0 refills | Status: AC
  Filled 2024-05-29: qty 2, 30d supply, fill #0

## 2024-05-29 NOTE — Patient Instructions (Addendum)
 We completed your annual physical today. I have refilled your medications.  Please return towards the end of October to update your pap smear.

## 2024-05-29 NOTE — Progress Notes (Signed)
 Complete physical exam  Patient: Madison [name]   DOB: 04-08-1981   43 y.o. Female  MRN: [id number]  Subjective:    Chief Complaint  Patient presents with   Annual Exam    Doesn't need a PAP     Madison [name] is a 43 y.o. female who presents today for a complete physical exam. She reports consuming a general diet. Gym/ health club routine includes cardio and mod to heavy weightlifting. Pt does [employer] - 3-5x/ week, one hour class. She generally feels well. She reports sleeping well. She does not have additional problems to discuss today.   Discussed the use of AI scribe software for clinical note transcription with the patient, who gave verbal consent to proceed.  History of Present Illness   Madison [name] is a 43 year old female who presents for an annual physical exam.  She is currently taking Lexapro  10 mg daily for mood stability, which she finds effective. She uses Buspar  15 mg as needed for stress, though she has only needed it once or twice. She also takes Zyrtec daily, diltiazem  once a day for hypertension, and ibuprofen  three times a day for knee pain. 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  She had knee surgery in January for a torn meniscus after cortisone shots failed to alleviate her pain. She is currently receiving OrthoVis injections and has been taking ibuprofen  three times a day as recommended by her doctor. The last injection was tender.  She has a history of hypothyroidism, specifically Hashimoto's thyroiditis, and takes Synthroid  112 mcg daily. Her mother also has hypothyroidism and high cholesterol.  She is allergic to shellfish and avoids it in her diet. She engages in physical activity through [facility] classes three to five times a week, which include weightlifting, cardio, and rowing. 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  She experiences occasional sleep disturbances, waking up at 3 AM over the past few weeks, but is usually able to return to sleep. She has constipation issues and uses Miralax as needed, along with daily Metamucil, fiber, and protein intake.  She has a history of migraines with auras, typically occurring around her menstrual cycle, but they are infrequent and sporadic. She has been on birth control for a while to prevent pregnancy, as she is sexually active and does not want children. She is considering alternative contraception options to OCPs, possibly tubal ligation.  Her last Pap smear was in October 2022, and she has never had an abnormal result. She is due for a routine HIV and hepatitis C screening. She had a needle stick injury in the past, which led to the discovery that her hepatitis B vaccination did not confer immunity, requiring a repeat series. She has since confirmed immunity and does not require further vaccination.      Most recent fall risk assessment:    05/29/2024    9:33 AM  Fall Risk   Falls in the past year? 0  Number falls in past yr: 0  Injury with Fall? 0  Risk for fall due to : No Fall Risks  Follow up Falls evaluation completed     Most recent depression screenings:    05/29/2024    9:33 AM 06/25/2023    9:57 AM  PHQ 2/9 Scores  PHQ - 2 Score 0 0  PHQ- 9 Score  0    Vision:Not within last year  and Dental: No current dental problems and No regular dental care   Patient Active Problem List   Diagnosis Date Noted   Primary osteoarthritis of both knees 02/28/2024   Pre-op evaluation 10/01/2023   Elevated blood pressure reading 10/01/2023   Other fatigue 07/06/2023   SOB (shortness of breath) on exertion 07/06/2023   Eating disorder 07/06/2023   Morbid obesity (HCC) 07/06/2023   Obesity, Class II, BMI 35-39.9 07/06/2023   Right knee meniscal tear 06/25/2023   Hot flashes 06/17/2023   Class 2 obesity due to excess calories  without serious comorbidity with body mass index (BMI) of 38.0 to 38.9 in adult 01/27/2023   Lethargy 01/27/2023   Weight gain 01/27/2023   Vitamin D  deficiency 01/27/2023   Encounter for contraceptive management 01/27/2023   Acute bacterial sinusitis 10/13/2022   GAD (generalized anxiety disorder) 06/26/2022   Depression, major, single episode, moderate (HCC) 06/26/2022   Obesity (BMI 35.0-39.9 without comorbidity) 06/26/2022   Acute stress disorder 06/25/2022   Allergic rhinitis 06/25/2022   Difficulty sleeping 06/25/2022   Eczema 06/25/2022   Hypothyroidism due to Hashimoto's thyroiditis 05/15/2019   Class 2 severe obesity due to excess calories with serious comorbidity and body mass index (BMI) of 39.0 to 39.9 in adult [facility]) 05/15/2019   Hyperlipidemia 07/02/2018   Hypothyroidism 07/02/2018   SVT (supraventricular tachycardia) (HCC) 11/30/2016   Allergic rhinitis due to pollen 06/12/2016   Idiopathic urticaria 06/12/2016   Exercise-induced bronchospasm 06/12/2016   Past Medical History:  Diagnosis Date   Allergy     Anxiety    Asthma    Back pain    Constipation    Eczema    Exercise-induced bronchospasm 06/12/2016   GERD (gastroesophageal reflux disease)    Herpes simplex disease    Hyperlipidemia    Hypothyroidism    Insomnia    Joint pain    Palpitation    PONV (postoperative nausea and vomiting)    SOB (shortness of breath)    SVT (supraventricular tachycardia) (HCC) 11/30/2016   Swelling    bilat LE   Urticaria    Vitamin D  deficiency    Past Surgical History:  Procedure Laterality Date   Gum Grafting  11/2015, 06/2017   KNEE ARTHROSCOPY Right 2001   KNEE ARTHROSCOPY WITH MEDIAL MENISECTOMY Right 11/25/2023   Procedure: KNEE ARTHROSCOPY WITH MEDIAL MENISECTOMY;  Surgeon: Cristy Bonner DASEN, MD;  Location: [facility];  Service: Orthopedics;  Laterality: Right;   WISDOM TOOTH EXTRACTION Bilateral 1997   Social History   Tobacco Use   Smoking  status: Never   Smokeless tobacco: Never  Vaping Use   Vaping status: Never Used  Substance Use Topics   Alcohol use: Yes    Alcohol/week: 4.0 standard drinks of alcohol    Types: 4 Standard drinks or equivalent per week   Drug use: No      Patient Care Team: Lowella Benton CROME, [place] as PCP - General (Physician Assistant) Inocencio Soyla Lunger, MD as PCP - Electrophysiology (Cardiology)   Outpatient Medications Prior to Visit  Medication Sig   busPIRone  (BUSPAR ) 15 MG tablet Take 1 tablet (15 mg total) by mouth 2 (two) times daily as needed (anxiety).   Cetirizine HCl (ZYRTEC PO) Take by mouth.   cyclobenzaprine  (FLEXERIL ) 10 MG tablet Take 0.5-1 tablets (5-10 mg total) by mouth at bedtime.   diltiazem  (CARDIZEM  CD) 300  MG 24 hr capsule Take 1 capsule (300 mg total) by mouth daily.   escitalopram  (LEXAPRO ) 10 MG tablet Take 1 tablet (10 mg total) by mouth daily.   levothyroxine  (SYNTHROID ) 112 MCG tablet Take 1 tablet (112 mcg total) by mouth daily before breakfast.   traMADol  (ULTRAM ) 50 MG tablet Take 1 tablet (50 mg total) by mouth every 12 (twelve) hours as needed for severe pain (pain score 7-10).   [DISCONTINUED] EPINEPHrine  0.3 mg/0.3 mL IJ SOAJ injection Inject as directed in emergency situation for severe allergic reaction   [DISCONTINUED] ibuprofen  (ADVIL ) 800 MG tablet Take 1 tablet (800 mg total) by mouth every 8 (eight) hours as needed.   [DISCONTINUED] Norgestimate -Ethinyl Estradiol Triphasic (TRI-VYLIBRA ) 0.18/0.215/0.25 MG-35 MCG tablet Take 1 tablet by mouth daily.   No facility-administered medications prior to visit.    ROS Complete 12 point ROS performed with all pertinent positives listed in HPI     Objective:     BP 121/83 (BP Location: Left Arm, Patient Position: Sitting, Cuff Size: Large)   Pulse 87   Resp 18   Ht 5' 5 (1.651 m)   Wt 203 lb 4 oz (92.2 kg)   SpO2 100%   BMI 33.82 kg/m  BP Readings from Last 3 Encounters:  05/29/24 121/83  12/01/23  (!) 126/92  11/30/23 114/76   Wt Readings from Last 3 Encounters:  05/29/24 203 lb 4 oz (92.2 kg)  12/01/23 235 lb (106.6 kg)  11/30/23 235 lb 3.2 oz (106.7 kg)      Physical Exam Vitals and nursing note reviewed.  Constitutional:      General: She is not in acute distress.    Appearance: Normal appearance. She is not ill-appearing, toxic-appearing or diaphoretic.  HENT:     Head: Normocephalic and atraumatic.     Right Ear: Tympanic membrane, ear canal and external ear normal. There is no impacted cerumen.     Left Ear: Tympanic membrane, ear canal and external ear normal. There is no impacted cerumen.     Nose: Nose normal.     Mouth/Throat:     Mouth: Mucous membranes are moist.     Pharynx: Oropharynx is clear. No oropharyngeal exudate or posterior oropharyngeal erythema.  Eyes:     General: No scleral icterus.       Right eye: No discharge.        Left eye: No discharge.     Extraocular Movements: Extraocular movements intact.     Pupils: Pupils are equal, round, and reactive to light.  Neck:     Thyroid : No thyroid  mass, thyromegaly or thyroid  tenderness.  Cardiovascular:     Rate and Rhythm: Normal rate and regular rhythm.     Pulses: Normal pulses.     Heart sounds: No murmur heard. Pulmonary:     Effort: Pulmonary effort is normal. No respiratory distress.     Breath sounds: Normal breath sounds. No stridor. No wheezing or rhonchi.  Abdominal:     General: Abdomen is flat. Bowel sounds are normal. There is no distension.     Palpations: Abdomen is soft. There is no mass.     Tenderness: There is no abdominal tenderness. There is no guarding.  Musculoskeletal:     Cervical back: Normal range of motion and neck supple. No rigidity or tenderness.     Right lower leg: No edema.     Left lower leg: No edema.  Lymphadenopathy:     Cervical: No cervical adenopathy.  Skin:  General: Skin is warm and dry.     Coloration: Skin is not jaundiced.     Findings: No  bruising, erythema or rash.  Neurological:     General: No focal deficit present.     Mental Status: She is alert and oriented to person, place, and time.     Sensory: No sensory deficit.     Motor: No weakness.  Psychiatric:        Mood and Affect: Mood normal.        Behavior: Behavior normal.      No results found for any visits on 05/29/24.      Assessment & Plan:    Routine Health Maintenance and Physical Exam  Immunization History  Administered Date(s) Administered   Fluzone Influenza virus vaccine,trivalent (IIV3), split virus 08/22/2010, 08/02/2019   Hepatitis B, ADULT 07/24/2011, 08/21/2011   Influenza, Quadrivalent, Recombinant, Inj, Pf 08/10/2017   Influenza,inj,Quad PF,6+ Mos 09/23/2016, 08/12/2018, 08/08/2020   Influenza,inj,quad, With Preservative 08/18/2013, 08/09/2014, 07/31/2015   Influenza,trivalent, recombinat, inj, PF 08/10/2017   Influenza-Unspecified 08/16/2017, 08/12/2018, 08/02/2019, 09/06/2023   PFIZER(Purple Top)SARS-COV-2 Vaccination 01/19/2020, 02/09/2020, 11/10/2021   Tdap 03/12/2010, 12/08/2019    Health Maintenance  Topic Date Due   HIV Screening  Never done   Hepatitis C Screening  Never done   HPV VACCINES (1 - 3-dose SCDM series) Never done   Hepatitis B Vaccines (3 of 3 - 19+ 3-dose series) 05/29/2025 (Originally 01/21/2012)   INFLUENZA VACCINE  06/09/2024   Cervical Cancer Screening (HPV/Pap Cotest)  09/02/2024   DTaP/Tdap/Td (3 - Td or Tdap) 12/07/2029   Meningococcal B Vaccine  Aged Out   COVID-19 Vaccine  Discontinued    Discussed health benefits of physical activity, and encouraged her to engage in regular exercise appropriate for her age and condition.  Problem List Items Addressed This Visit     Vitamin D  deficiency   Relevant Orders   Vitamin D  (25 hydroxy)   Encounter for contraceptive management   Relevant Medications   Norgestimate -Ethinyl Estradiol Triphasic (TRI-VYLIBRA ) 0.18/0.215/0.25 MG-35 MCG tablet    Hypothyroidism due to Hashimoto's thyroiditis   Relevant Orders   TSH   Primary osteoarthritis of both knees   Relevant Medications   ibuprofen  (ADVIL ) 800 MG tablet   Other Visit Diagnoses       Encounter for screening for HIV    -  Primary   Relevant Orders   HIV antibody (with reflex)     Wellness examination       Relevant Orders   CBC with Differential/Platelet   Hemoglobin A1c   TSH   Lipid panel   Comprehensive metabolic panel with GFR   Vitamin D  (25 hydroxy)     Need for hepatitis C screening test       Relevant Orders   Hepatitis C Antibody     Visit for screening mammogram       Relevant Orders   MM DIGITAL SCREENING BILATERAL      Return in about 3 months (around 09/07/2024).  Assessment and Plan    General Health Maintenance Allergic to shellfish, engages in regular physical activity, manages constipation with Miralax and Metamucil. Due for Pap smear in october, HIV, and Hepatitis C screenings. History of vitamin D  deficiency. - Order routine blood work including CMP, lipid panel, TSH, A1c, CBC, HIV, and Hepatitis C screenings. - Order vitamin D  level due to history of deficiency. - Schedule Pap smear for end of October. - Place order for screening mammogram for  September 13, 2024.  Knee Pain Post-Surgery Underwent knee surgery for torn meniscus, receiving OrthoVis injections, and taking ibuprofen  for pain and inflammation. - Continue ibuprofen  three times daily as prescribed by sports med - Follow up with Doctor T for ongoing knee management.  Migraine with Aura Experiences migraines with aura, however states none in months. Currently on estrogen-containing birth control, discussed this as risk factor and possibly changing to non-estrogen contraception. Discussed potential discontinuation if migraines increase. - Discuss potential alternative contraceptive options, such as ParaGard IUD, during the next visit if desired.  Birth Control Management On  estrogen-containing birth control. Interested in permanent sterilization but advised against due to age. Considering alternatives like ParaGard IUD. - Discuss alternative contraceptive options, including permanent sterilization and non-hormonal IUDs, during the next visit.  Hypothyroidism (Hashimoto's Thyroiditis) Has Hashimoto's Thyroiditis, on levothyroxine  112 mcg daily. Familial component noted. - Refill levothyroxine  prescription.  Anxiety On Lexapro  10 mg daily, uses Buspar  15 mg as needed. Lexapro  maintains mood stability. - Refill Lexapro  prescription.      Return in 3 months for pap smear and possible contraception change.    Benton LITTIE Gave, PA

## 2024-05-30 LAB — CBC WITH DIFFERENTIAL/PLATELET
Basophils Absolute: 0.1 x10E3/uL (ref 0.0–0.2)
Basos: 2 %
EOS (ABSOLUTE): 0.3 x10E3/uL (ref 0.0–0.4)
Eos: 5 %
Hematocrit: 40.4 % (ref 34.0–46.6)
Hemoglobin: 13.4 g/dL (ref 11.1–15.9)
Immature Grans (Abs): 0 x10E3/uL (ref 0.0–0.1)
Immature Granulocytes: 0 %
Lymphocytes Absolute: 2 x10E3/uL (ref 0.7–3.1)
Lymphs: 35 %
MCH: 30.9 pg (ref 26.6–33.0)
MCHC: 33.2 g/dL (ref 31.5–35.7)
MCV: 93 fL (ref 79–97)
Monocytes Absolute: 0.5 x10E3/uL (ref 0.1–0.9)
Monocytes: 9 %
Neutrophils Absolute: 2.8 x10E3/uL (ref 1.4–7.0)
Neutrophils: 49 %
Platelets: 422 x10E3/uL (ref 150–450)
RBC: 4.34 x10E6/uL (ref 3.77–5.28)
RDW: 12.5 % (ref 11.7–15.4)
WBC: 5.6 x10E3/uL (ref 3.4–10.8)

## 2024-05-30 LAB — LIPID PANEL
Chol/HDL Ratio: 4.4 ratio (ref 0.0–4.4)
Cholesterol, Total: 292 mg/dL — ABNORMAL HIGH (ref 100–199)
HDL: 67 mg/dL (ref >39)
LDL Chol Calc (NIH): 216 mg/dL — ABNORMAL HIGH (ref 0–99)
Triglycerides: 61 mg/dL (ref 0–149)
VLDL Cholesterol Cal: 9 mg/dL (ref 5–40)

## 2024-05-30 LAB — COMPREHENSIVE METABOLIC PANEL WITH GFR
ALT: 11 IU/L (ref 0–32)
AST: 15 IU/L (ref 0–40)
Albumin: 4.2 g/dL (ref 3.9–4.9)
Alkaline Phosphatase: 69 IU/L (ref 44–121)
BUN/Creatinine Ratio: 15 (ref 9–23)
BUN: 13 mg/dL (ref 6–24)
Bilirubin Total: 0.5 mg/dL (ref 0.0–1.2)
CO2: 16 mmol/L — ABNORMAL LOW (ref 20–29)
Calcium: 9.3 mg/dL (ref 8.7–10.2)
Chloride: 103 mmol/L (ref 96–106)
Creatinine, Ser: 0.88 mg/dL (ref 0.57–1.00)
Globulin, Total: 2.8 g/dL (ref 1.5–4.5)
Glucose: 86 mg/dL (ref 70–99)
Potassium: 4.7 mmol/L (ref 3.5–5.2)
Sodium: 138 mmol/L (ref 134–144)
Total Protein: 7 g/dL (ref 6.0–8.5)
eGFR: 84 mL/min/1.73 (ref >59)

## 2024-05-30 LAB — HEMOGLOBIN A1C
Est. average glucose Bld gHb Est-mCnc: 105 mg/dL
Hgb A1c MFr Bld: 5.3 % (ref 4.8–5.6)

## 2024-05-30 LAB — TSH: TSH: 2.63 u[IU]/mL (ref 0.450–4.500)

## 2024-05-30 LAB — HIV ANTIBODY (ROUTINE TESTING W REFLEX): HIV Screen 4th Generation wRfx: NONREACTIVE

## 2024-05-30 LAB — HEPATITIS C ANTIBODY: Hep C Virus Ab: NONREACTIVE

## 2024-05-30 LAB — VITAMIN D 25 HYDROXY (VIT D DEFICIENCY, FRACTURES): Vit D, 25-Hydroxy: 50.1 ng/mL (ref 30.0–100.0)

## 2024-06-02 ENCOUNTER — Ambulatory Visit: Payer: Self-pay | Admitting: Urgent Care

## 2024-06-02 ENCOUNTER — Other Ambulatory Visit (HOSPITAL_COMMUNITY): Payer: Self-pay

## 2024-06-12 ENCOUNTER — Other Ambulatory Visit (HOSPITAL_COMMUNITY): Payer: Self-pay

## 2024-06-19 ENCOUNTER — Encounter: Payer: Self-pay | Admitting: Urgent Care

## 2024-06-19 NOTE — Telephone Encounter (Signed)
 No, but I would like for her to at least complete the vaccination section for me prior to sending it to me... thanks

## 2024-06-21 ENCOUNTER — Encounter: Payer: Self-pay | Admitting: Urgent Care

## 2024-06-21 ENCOUNTER — Other Ambulatory Visit: Payer: Self-pay

## 2024-06-21 DIAGNOSIS — Z0184 Encounter for antibody response examination: Secondary | ICD-10-CM

## 2024-06-21 DIAGNOSIS — Z111 Encounter for screening for respiratory tuberculosis: Secondary | ICD-10-CM

## 2024-06-21 DIAGNOSIS — E063 Autoimmune thyroiditis: Secondary | ICD-10-CM

## 2024-06-22 ENCOUNTER — Other Ambulatory Visit: Payer: Self-pay

## 2024-06-22 ENCOUNTER — Other Ambulatory Visit (HOSPITAL_COMMUNITY): Payer: Self-pay

## 2024-06-22 NOTE — Telephone Encounter (Signed)
 No I will fill out the form with the information from her last visit. Thanks

## 2024-06-26 DIAGNOSIS — Z111 Encounter for screening for respiratory tuberculosis: Secondary | ICD-10-CM | POA: Diagnosis not present

## 2024-06-26 DIAGNOSIS — Z0184 Encounter for antibody response examination: Secondary | ICD-10-CM | POA: Diagnosis not present

## 2024-06-27 ENCOUNTER — Encounter (HOSPITAL_COMMUNITY): Payer: Self-pay

## 2024-06-27 ENCOUNTER — Other Ambulatory Visit: Payer: Self-pay | Admitting: Urgent Care

## 2024-06-27 ENCOUNTER — Other Ambulatory Visit: Payer: Self-pay

## 2024-06-27 ENCOUNTER — Ambulatory Visit: Payer: Self-pay | Admitting: Urgent Care

## 2024-06-27 ENCOUNTER — Other Ambulatory Visit (HOSPITAL_COMMUNITY): Payer: Self-pay

## 2024-06-27 ENCOUNTER — Other Ambulatory Visit: Payer: Self-pay | Admitting: Family Medicine

## 2024-06-27 ENCOUNTER — Encounter: Payer: Self-pay | Admitting: Urgent Care

## 2024-06-27 ENCOUNTER — Other Ambulatory Visit: Payer: Self-pay | Admitting: Sports Medicine

## 2024-06-27 DIAGNOSIS — E039 Hypothyroidism, unspecified: Secondary | ICD-10-CM

## 2024-06-27 DIAGNOSIS — S83203D Other tear of unspecified meniscus, current injury, right knee, subsequent encounter: Secondary | ICD-10-CM

## 2024-06-27 MED ORDER — LEVOTHYROXINE SODIUM 112 MCG PO TABS
112.0000 ug | ORAL_TABLET | Freq: Every day | ORAL | 3 refills | Status: AC
  Filled 2024-06-27: qty 90, 90d supply, fill #0
  Filled 2024-09-30: qty 90, 90d supply, fill #1

## 2024-06-28 ENCOUNTER — Other Ambulatory Visit (HOSPITAL_COMMUNITY): Payer: Self-pay

## 2024-06-28 LAB — QUANTIFERON-TB GOLD PLUS
QuantiFERON Mitogen Value: 3.87 [IU]/mL
QuantiFERON Nil Value: 0.03 [IU]/mL
QuantiFERON TB1 Ag Value: 0.02 [IU]/mL
QuantiFERON TB2 Ag Value: 0.02 [IU]/mL

## 2024-06-28 LAB — VARICELLA ZOSTER ANTIBODY, IGG: Varicella zoster IgG: REACTIVE

## 2024-07-05 ENCOUNTER — Other Ambulatory Visit: Payer: Self-pay

## 2024-07-05 ENCOUNTER — Other Ambulatory Visit: Payer: Self-pay | Admitting: Urgent Care

## 2024-07-05 ENCOUNTER — Other Ambulatory Visit (HOSPITAL_COMMUNITY): Payer: Self-pay

## 2024-07-07 ENCOUNTER — Other Ambulatory Visit (HOSPITAL_COMMUNITY): Payer: Self-pay

## 2024-07-07 ENCOUNTER — Other Ambulatory Visit: Payer: Self-pay | Admitting: Urgent Care

## 2024-07-07 ENCOUNTER — Other Ambulatory Visit: Payer: Self-pay

## 2024-07-07 DIAGNOSIS — S83203D Other tear of unspecified meniscus, current injury, right knee, subsequent encounter: Secondary | ICD-10-CM

## 2024-07-11 ENCOUNTER — Encounter: Payer: Self-pay | Admitting: Sports Medicine

## 2024-07-13 ENCOUNTER — Other Ambulatory Visit (HOSPITAL_COMMUNITY): Payer: Self-pay

## 2024-07-14 ENCOUNTER — Other Ambulatory Visit (HOSPITAL_COMMUNITY): Payer: Self-pay

## 2024-07-14 ENCOUNTER — Other Ambulatory Visit: Payer: Self-pay | Admitting: Urgent Care

## 2024-07-14 ENCOUNTER — Other Ambulatory Visit: Payer: Self-pay

## 2024-07-14 DIAGNOSIS — S83203D Other tear of unspecified meniscus, current injury, right knee, subsequent encounter: Secondary | ICD-10-CM

## 2024-07-17 ENCOUNTER — Encounter: Payer: Self-pay | Admitting: Urgent Care

## 2024-07-17 DIAGNOSIS — F321 Major depressive disorder, single episode, moderate: Secondary | ICD-10-CM

## 2024-07-18 ENCOUNTER — Other Ambulatory Visit (HOSPITAL_COMMUNITY): Payer: Self-pay

## 2024-07-18 MED ORDER — ESCITALOPRAM OXALATE 10 MG PO TABS
10.0000 mg | ORAL_TABLET | Freq: Every day | ORAL | 3 refills | Status: AC
Start: 2024-07-18 — End: ?
  Filled 2024-07-18: qty 90, 90d supply, fill #0
  Filled 2024-10-19: qty 90, 90d supply, fill #1

## 2024-07-18 NOTE — Telephone Encounter (Signed)
 Epic would not let me pend the medication here for refill Requesting rx rf of lexapro  10mg   Last written 09/09/224 Last OV 05/29/2024 Upcoming appt 09/01/2024

## 2024-07-20 ENCOUNTER — Other Ambulatory Visit (HOSPITAL_COMMUNITY): Payer: Self-pay

## 2024-07-24 ENCOUNTER — Other Ambulatory Visit (HOSPITAL_COMMUNITY): Payer: Self-pay

## 2024-07-24 ENCOUNTER — Other Ambulatory Visit: Payer: Self-pay | Admitting: Urgent Care

## 2024-07-24 ENCOUNTER — Other Ambulatory Visit: Payer: Self-pay

## 2024-07-24 DIAGNOSIS — S83203D Other tear of unspecified meniscus, current injury, right knee, subsequent encounter: Secondary | ICD-10-CM

## 2024-07-25 ENCOUNTER — Other Ambulatory Visit: Payer: Self-pay

## 2024-07-25 ENCOUNTER — Other Ambulatory Visit (HOSPITAL_COMMUNITY): Payer: Self-pay

## 2024-07-25 ENCOUNTER — Encounter: Payer: Self-pay | Admitting: Urgent Care

## 2024-07-25 DIAGNOSIS — S83203D Other tear of unspecified meniscus, current injury, right knee, subsequent encounter: Secondary | ICD-10-CM

## 2024-07-25 MED ORDER — TRAMADOL HCL 50 MG PO TABS: 50.0000 mg | ORAL_TABLET | Freq: Two times a day (BID) | ORAL | 0 refills | Status: DC | PRN

## 2024-07-25 MED FILL — Tramadol HCl Tab 50 MG: ORAL | 15 days supply | Qty: 30 | Fill #0 | Status: AC

## 2024-07-25 NOTE — Telephone Encounter (Signed)
 Forwarding request to Dr. Alvan covering Madison [name]  Could not pend Tramadol  prescription through epic Patient requesting rx rf of Tramadol  50mg  Last written 07/182025 Last OV 05/29/2024 Upcoming appt 09/01/2024

## 2024-07-25 NOTE — Addendum Note (Signed)
 Addended by: Aviance Cooperwood D on: 07/25/2024 06:13 PM   Modules accepted: Orders

## 2024-07-26 ENCOUNTER — Other Ambulatory Visit (HOSPITAL_COMMUNITY): Payer: Self-pay

## 2024-07-26 ENCOUNTER — Other Ambulatory Visit: Payer: Self-pay | Admitting: Family Medicine

## 2024-07-26 DIAGNOSIS — S83203D Other tear of unspecified meniscus, current injury, right knee, subsequent encounter: Secondary | ICD-10-CM

## 2024-07-26 MED ORDER — TRAMADOL HCL 50 MG PO TABS
50.0000 mg | ORAL_TABLET | Freq: Two times a day (BID) | ORAL | 0 refills | Status: AC | PRN
  Filled 2024-07-26 – 2024-10-01 (×2): qty 30, 15d supply, fill #0

## 2024-07-26 NOTE — Progress Notes (Signed)
 Meds ordered this encounter  Medications   traMADol  (ULTRAM ) 50 MG tablet    Sig: Take 1 tablet (50 mg total) by mouth every 12 (twelve) hours as needed for severe pain (pain score 7-10).    Dispense:  30 tablet    Refill:  0

## 2024-07-27 ENCOUNTER — Other Ambulatory Visit (HOSPITAL_COMMUNITY): Payer: Self-pay

## 2024-08-16 ENCOUNTER — Other Ambulatory Visit (HOSPITAL_COMMUNITY): Payer: Self-pay

## 2024-08-16 MED ORDER — FLUZONE 0.5 ML IM SUSY
0.5000 mL | PREFILLED_SYRINGE | Freq: Once | INTRAMUSCULAR | 0 refills | Status: AC
  Filled 2024-08-16: qty 0.5, 1d supply, fill #0

## 2024-09-01 ENCOUNTER — Ambulatory Visit: Admitting: Urgent Care

## 2024-09-02 ENCOUNTER — Telehealth: Admitting: Physician Assistant

## 2024-09-02 ENCOUNTER — Other Ambulatory Visit (HOSPITAL_COMMUNITY): Payer: Self-pay

## 2024-09-02 DIAGNOSIS — L232 Allergic contact dermatitis due to cosmetics: Secondary | ICD-10-CM

## 2024-09-02 MED ORDER — TRIAMCINOLONE ACETONIDE 0.1 % EX CREA
1.0000 | TOPICAL_CREAM | Freq: Two times a day (BID) | CUTANEOUS | 0 refills | Status: DC
  Filled 2024-09-02: qty 30, 15d supply, fill #0

## 2024-09-02 MED ORDER — PREDNISONE 10 MG (21) PO TBPK
ORAL_TABLET | ORAL | 0 refills | Status: DC
  Filled 2024-09-02: qty 21, 6d supply, fill #0

## 2024-09-02 NOTE — Progress Notes (Signed)
 . E-Visit for Eczema  We are sorry that you are not feeling well. Here is how we plan to help! Based on what you shared with me it looks like you have eczema (atopic dermatitis).  Although the cause of eczema is not completely understood, genetics appear to play a strong role, and people with a family history of eczema are at increased risk of developing the condition. In most people with eczema, there is a genetic abnormality in the outermost layer of the skin, called the epidermis   Most people with eczema develop their first symptoms as children, before the age of 38. Intense itching of the skin, patches of redness, small bumps, and skin flaking are common. Scratching can further inflame the skin and worsen the itching. The itchiness may be more noticeable at nighttime.  Eczema commonly affects the back of the neck, the elbow creases, and the backs of the knees. Other affected areas may include the face, wrists, and forearms. The skin may become thickened and darkened, or even scarred, from repeated scratching. Eliminating factors that aggravate your eczema symptoms can help to control the symptoms. Possible triggers may include: ? Cold or dry environments ? Sweating ? Emotional stress or anxiety ? Rapid temperature changes ? Exposure to certain chemicals or cleaning solutions, including soaps and detergents, perfumes and cosmetics, wool or synthetic fibers, dust, sand, and cigarette smoke Keeping your skin hydrated Emollients -- Emollients are creams and ointments that moisturize the skin and prevent it from drying out. The best emollients for people with eczema are thick creams (such as Eucerin, Cetaphil, and Nutraderm) or ointments (such as petroleum jelly, Aquaphor, and Vaseline), which contain little to no water. Emollients are most effective when applied immediately after bathing. Emollients can be applied twice a day or more often if needed. Lotions contain more water than creams and  ointments and are less effective for moisturizing the skin. Bathing -- It is not clear if showers or baths are better for keeping the skin hydrated. Lukewarm baths or showers can hydrate and cool the skin, temporarily relieving itching from eczema. An unscented, mild soap or non-soap cleanser (such as Cetaphil) should be used sparingly. Apply an emollient immediately after bathing or showering to prevent your skin from drying out as a result of water evaporation. Emollient bath additives (products you add to the bath water) have not been found to help relieve symptoms. Hot or long baths (more than 10 to 15 minutes) and showers should be avoided since they can dry out the skin.  Based on what you shared with me you may have eczema.   Triamcinalone ointment (or cream). Apply to the effected areas twice per day. And a short course of steroids.    I recommend dilute bleach baths for people with eczema. These baths help to decrease the number of bacteria on the skin that can cause infections or worsen symptoms. To prepare a bleach bath, one-fourth to one-half cup of bleach is placed in a full bathtub (about 40 gallons) of water. Bleach baths are usually taken for 5 to 10 minutes twice per week and should be followed by application of an emollient (listed above). I recommend you take Benadryl 25mg  - 50mg  every 4 hours to control the symptoms (including itching) but if they last over 24 hours it is best that you see an office based provider for follow up.  HOME CARE: Take lukewarm showers or baths Apply creams and ointments to prevent the skin from drying (Eucerin, Cetaphil,  Nutraderm, petroleum jelly, Aquaphor or Vaseline) - these products contain less water than other lotions and are more effective for moisturizing the skin Limit exposure to cold or dry environments, sweating, emotional stress and anxiety, rapid temperature changes and exposure to chemicals and cleaning products, soaps and detergents,  perfumes, cosmetics, wool and synthetic fibers, dust, sand and cigarette- factors which can aggravate eczema symptoms.  Use a hydrocortisone cream once or twice a day Take an antihistamine like Benadryl for widespread rashes that itch.  The adult dosage of Benadryl is 25-50 mg by mouth 4 times daily. Caution: This type of medication may cause sleepiness.  Do not drink alcohol, drive, or operate dangerous machinery while taking antihistamines.  Do not take these medications if you have prostate enlargement.  Read the package instructions thoroughly on all medications that you take.  GET HELP RIGHT AWAY IF: Symptoms that don't go away after treatment. Severe itching that persists. You develop a fever. Your skin begins to drain. You have a sore throat. You become short of breath.  MAKE SURE YOU   Understand these instructions. Will watch your condition. Will get help right away if you are not doing well or get worse.    Thank you for choosing an e-visit.  Your e-visit answers were reviewed by a board certified advanced clinical practitioner to complete your personal care plan. Depending upon the condition, your plan could have included both over the counter or prescription medications.  Please review your pharmacy choice. Make sure the pharmacy is open so you can pick up prescription now. If there is a problem, you may contact your provider through [employer] and have the prescription routed to another pharmacy.  Your safety is important to us . If you have drug allergies check your prescription carefully.   For the next 24 hours you can use MyChart to ask questions about today's visit, request a non-urgent call back, or ask for a work or school excuse. You will get an email in the next two days asking about your experience. I hope that your e-visit has been valuable and will speed your recovery.   I have spent 5 minutes in review of e-visit questionnaire, review and updating patient  chart, medical decision making and response to patient.   Teena Shuck, PA-C

## 2024-09-05 ENCOUNTER — Encounter: Payer: Self-pay | Admitting: Urgent Care

## 2024-09-05 ENCOUNTER — Ambulatory Visit: Admitting: Urgent Care

## 2024-09-05 ENCOUNTER — Other Ambulatory Visit (HOSPITAL_COMMUNITY)
Admission: RE | Admit: 2024-09-05 | Discharge: 2024-09-05 | Disposition: A | Discharge status: Home / Self Care | Source: Ambulatory Visit | Attending: Urgent Care | Admitting: Urgent Care

## 2024-09-05 VITALS — BP 128/81 | HR 85 | Ht 65.0 in | Wt 198.0 lb

## 2024-09-05 DIAGNOSIS — Z124 Encounter for screening for malignant neoplasm of cervix: Secondary | ICD-10-CM

## 2024-09-05 DIAGNOSIS — E782 Mixed hyperlipidemia: Secondary | ICD-10-CM

## 2024-09-05 NOTE — Patient Instructions (Signed)
 We completed your pap smear today.  We also obtained your repeat cholesterol screening. We will communicate via mychart your results and recommendations.

## 2024-09-05 NOTE — Progress Notes (Signed)
 Established Patient Office Visit  Subjective:  Patient ID: Madison [name], female    DOB: [birth year]  Age: 43 y.o. MRN: [id number]  Chief Complaint  Patient presents with   Gynecologic Exam    Pt presents to update pap smear. NO GU complaints. Additionally, she has been taking OTC supplements to help with her cholesterol control. She is fasting today for recheck.  Gynecologic Exam    Patient Active Problem List   Diagnosis Date Noted   Primary osteoarthritis of both knees 02/28/2024   Pre-op evaluation 10/01/2023   Elevated blood pressure reading 10/01/2023   Other fatigue 07/06/2023   SOB (shortness of breath) on exertion 07/06/2023   Eating disorder 07/06/2023   Morbid obesity (HCC) 07/06/2023   Obesity, Class II, BMI 35-39.9 07/06/2023   Right knee meniscal tear 06/25/2023   Hot flashes 06/17/2023   Class 2 obesity due to excess calories without serious comorbidity with body mass index (BMI) of 38.0 to 38.9 in adult 01/27/2023   Lethargy 01/27/2023   Weight gain 01/27/2023   Vitamin D  deficiency 01/27/2023   Encounter for contraceptive management 01/27/2023   Acute bacterial sinusitis 10/13/2022   GAD (generalized anxiety disorder) 06/26/2022   Depression, major, single episode, moderate (HCC) 06/26/2022   Obesity (BMI 35.0-39.9 without comorbidity) 06/26/2022   Acute stress disorder 06/25/2022   Allergic rhinitis 06/25/2022   Difficulty sleeping 06/25/2022   Eczema 06/25/2022   Hypothyroidism due to Hashimoto's thyroiditis 05/15/2019   Class 2 severe obesity due to excess calories with serious comorbidity and body mass index (BMI) of 39.0 to 39.9 in adult 05/15/2019   Hyperlipidemia 07/02/2018   Hypothyroidism 07/02/2018   SVT (supraventricular tachycardia) 11/30/2016   Allergic rhinitis due to pollen 06/12/2016   Idiopathic urticaria 06/12/2016   Exercise-induced bronchospasm 06/12/2016   Past Medical History:  Diagnosis Date   Allergy     Anxiety     Asthma    Back pain    Constipation    Eczema    Exercise-induced bronchospasm 06/12/2016   GERD (gastroesophageal reflux disease)    Herpes simplex disease    Hyperlipidemia    Hypothyroidism    Insomnia    Joint pain    Palpitation    PONV (postoperative nausea and vomiting)    SOB (shortness of breath)    SVT (supraventricular tachycardia) 11/30/2016   Swelling    bilat LE   Urticaria    Vitamin D  deficiency    Past Surgical History:  Procedure Laterality Date   Gum Grafting  11/2015, 06/2017   KNEE ARTHROSCOPY Right 2001   KNEE ARTHROSCOPY WITH MEDIAL MENISECTOMY Right 11/25/2023   Procedure: KNEE ARTHROSCOPY WITH MEDIAL MENISECTOMY;  Surgeon: Cristy Bonner DASEN, MD;  [facility];  Service: Orthopedics;  Laterality: Right;   WISDOM TOOTH EXTRACTION Bilateral 1997   Social History   Tobacco Use   Smoking status: Never   Smokeless tobacco: Never  Vaping Use   Vaping status: Never Used  Substance Use Topics   Alcohol use: Yes    Alcohol/week: 4.0 standard drinks of alcohol    Types: 4 Standard drinks or equivalent per week   Drug use: No      ROS: as noted in HPI  Objective:     BP 128/81   Pulse 85   Ht 5' 5 (1.651 m)   Wt 198 lb (89.8 kg)   SpO2 100%   BMI 32.95 kg/m  BP Readings from Last 3 Encounters:  09/05/24 128/81  05/29/24 121/83  12/01/23 (!) 126/92   Wt Readings from Last 3 Encounters:  09/05/24 198 lb (89.8 kg)  05/29/24 203 lb 4 oz (92.2 kg)  12/01/23 235 lb (106.6 kg)      Physical Exam Vitals and nursing note reviewed. Exam conducted with a chaperone present.  Constitutional:      General: She is not in acute distress.    Appearance: Normal appearance. She is not toxic-appearing or diaphoretic.  Cardiovascular:     Rate and Rhythm: Normal rate.  Pulmonary:     Effort: Pulmonary effort is normal. No respiratory distress.  Genitourinary:    Pubic Area: No rash or pubic lice.      Labia:        Right: No  rash, tenderness, lesion or injury.        Left: No rash, tenderness, lesion or injury.      Urethra: No prolapse, urethral pain, urethral swelling or urethral lesion.     Vagina: Normal. No vaginal discharge or lesions.     Cervix: Normal. No cervical motion tenderness, discharge, friability or lesion.     Uterus: Normal.      Adnexa: Right adnexa normal and left adnexa normal.       Right: No mass, tenderness or fullness.         Left: No mass, tenderness or fullness.       Rectum: Normal.  Lymphadenopathy:     Lower Body: No right inguinal adenopathy. No left inguinal adenopathy.  Neurological:     General: No focal deficit present.     Mental Status: She is oriented to person, place, and time.  Psychiatric:        Mood and Affect: Mood normal.        Behavior: Behavior normal.      No results found for any visits on 09/05/24.  Last CBC Lab Results  Component Value Date   WBC 5.6 05/29/2024   HGB 13.4 05/29/2024   HCT 40.4 05/29/2024   MCV 93 05/29/2024   MCH 30.9 05/29/2024   RDW 12.5 05/29/2024   PLT 422 05/29/2024   Last metabolic panel Lab Results  Component Value Date   GLUCOSE 86 05/29/2024   NA 138 05/29/2024   K 4.7 05/29/2024   CL 103 05/29/2024   CO2 16 (L) 05/29/2024   BUN 13 05/29/2024   CREATININE 0.88 05/29/2024   EGFR 84 05/29/2024   CALCIUM  9.3 05/29/2024   PROT 7.0 05/29/2024   ALBUMIN 4.2 05/29/2024   LABGLOB 2.8 05/29/2024   AGRATIO 1.8 08/24/2018   BILITOT 0.5 05/29/2024   ALKPHOS 69 05/29/2024   AST 15 05/29/2024   ALT 11 05/29/2024      The 10-year ASCVD risk score (Arnett DK, et al., 2019) is: 1%  Assessment & Plan:  Cervical cancer screening -     Cytology - PAP  Mixed hyperlipidemia -     Lipid panel   Pap smear updated today. Fasting lipid panel obtained  Return in about 1 year (around 09/05/2025) for Annual Physical.   Benton LITTIE Gave, PA

## 2024-09-06 ENCOUNTER — Ambulatory Visit: Payer: Self-pay | Admitting: Urgent Care

## 2024-09-06 LAB — CYTOLOGY - PAP
Comment: NEGATIVE
Diagnosis: NEGATIVE
High risk HPV: NEGATIVE

## 2024-09-06 LAB — LIPID PANEL
Chol/HDL Ratio: 3.2 ratio (ref 0.0–4.4)
Cholesterol, Total: 250 mg/dL — ABNORMAL HIGH (ref 100–199)
HDL: 79 mg/dL (ref >39)
LDL Chol Calc (NIH): 160 mg/dL — ABNORMAL HIGH (ref 0–99)
Triglycerides: 66 mg/dL (ref 0–149)
VLDL Cholesterol Cal: 11 mg/dL (ref 5–40)

## 2024-09-21 ENCOUNTER — Ambulatory Visit
Admission: RE | Admit: 2024-09-21 | Discharge: 2024-09-21 | Disposition: A | Discharge status: Home / Self Care | Source: Ambulatory Visit | Attending: Urgent Care | Admitting: Urgent Care

## 2024-09-21 ENCOUNTER — Other Ambulatory Visit (HOSPITAL_COMMUNITY): Payer: Self-pay

## 2024-09-21 ENCOUNTER — Ambulatory Visit: Admitting: Urgent Care

## 2024-09-21 VITALS — BP 113/73 | HR 79 | Temp 98.3°F | Ht 65.0 in | Wt 194.0 lb

## 2024-09-21 DIAGNOSIS — Z1231 Encounter for screening mammogram for malignant neoplasm of breast: Secondary | ICD-10-CM | POA: Diagnosis not present

## 2024-09-21 DIAGNOSIS — J0101 Acute recurrent maxillary sinusitis: Secondary | ICD-10-CM | POA: Diagnosis not present

## 2024-09-21 MED ORDER — AMOXICILLIN-POT CLAVULANATE 875-125 MG PO TABS
1.0000 | ORAL_TABLET | Freq: Two times a day (BID) | ORAL | 0 refills | Status: DC
  Filled 2024-09-21: qty 20, 10d supply, fill #0

## 2024-09-21 NOTE — Progress Notes (Unsigned)
 Established Patient Office Visit  Subjective:  Patient ID: Madison [name], female    DOB: 09-21-1981  Age: 43 y.o. MRN: [id number]  Chief Complaint  Patient presents with   Facial Pain    HPI  Patient Active Problem List   Diagnosis Date Noted   Primary osteoarthritis of both knees 02/28/2024   Pre-op evaluation 10/01/2023   Elevated blood pressure reading 10/01/2023   Other fatigue 07/06/2023   SOB (shortness of breath) on exertion 07/06/2023   Eating disorder 07/06/2023   Morbid obesity (HCC) 07/06/2023   Obesity, Class II, BMI 35-39.9 07/06/2023   Right knee meniscal tear 06/25/2023   Hot flashes 06/17/2023   Class 2 obesity due to excess calories without serious comorbidity with body mass index (BMI) of 38.0 to 38.9 in adult 01/27/2023   Lethargy 01/27/2023   Weight gain 01/27/2023   Vitamin D  deficiency 01/27/2023   Encounter for contraceptive management 01/27/2023   Acute bacterial sinusitis 10/13/2022   GAD (generalized anxiety disorder) 06/26/2022   Depression, major, single episode, moderate (HCC) 06/26/2022   Obesity (BMI 35.0-39.9 without comorbidity) 06/26/2022   Acute stress disorder 06/25/2022   Allergic rhinitis 06/25/2022   Difficulty sleeping 06/25/2022   Eczema 06/25/2022   Hypothyroidism due to Hashimoto's thyroiditis 05/15/2019   Class 2 severe obesity due to excess calories with serious comorbidity and body mass index (BMI) of 39.0 to 39.9 in adult 05/15/2019   Hyperlipidemia 07/02/2018   Hypothyroidism 07/02/2018   SVT (supraventricular tachycardia) 11/30/2016   Allergic rhinitis due to pollen 06/12/2016   Idiopathic urticaria 06/12/2016   Exercise-induced bronchospasm 06/12/2016   Past Medical History:  Diagnosis Date   Allergy     Anxiety    Asthma    Back pain    Constipation    Eczema    Exercise-induced bronchospasm 06/12/2016   GERD (gastroesophageal reflux disease)    Herpes simplex disease    Hyperlipidemia    Hypothyroidism     Insomnia    Joint pain    Palpitation    PONV (postoperative nausea and vomiting)    SOB (shortness of breath)    SVT (supraventricular tachycardia) 11/30/2016   Swelling    bilat LE   Urticaria    Vitamin D  deficiency    Past Surgical History:  Procedure Laterality Date   Gum Grafting  11/2015, 06/2017   KNEE ARTHROSCOPY Right 2001   KNEE ARTHROSCOPY WITH MEDIAL MENISECTOMY Right 11/25/2023   Procedure: KNEE ARTHROSCOPY WITH MEDIAL MENISECTOMY;  Surgeon: Cristy Bonner DASEN, MD;  Location: [facility];  Service: Orthopedics;  Laterality: Right;   WISDOM TOOTH EXTRACTION Bilateral 1997   Social History   Tobacco Use   Smoking status: Never   Smokeless tobacco: Never  Vaping Use   Vaping status: Never Used  Substance Use Topics   Alcohol use: Yes    Alcohol/week: 4.0 standard drinks of alcohol    Types: 4 Standard drinks or equivalent per week   Drug use: No      ROS: as noted in HPI  Objective:     BP 113/73   Pulse 79   Temp 98.3 F (36.8 C) (Oral)   Ht 5' 5 (1.651 m)   Wt 194 lb (88 kg)   SpO2 100%   BMI 32.28 kg/m  BP Readings from Last 3 Encounters:  09/21/24 113/73  09/05/24 128/81  05/29/24 121/83   Wt Readings from Last 3 Encounters:  09/21/24 194 lb (88 kg)  09/05/24 198 lb (  89.8 kg)  05/29/24 203 lb 4 oz (92.2 kg)      Physical Exam   No results found for any visits on 09/21/24.  Last CBC Lab Results  Component Value Date   WBC 5.6 05/29/2024   HGB 13.4 05/29/2024   HCT 40.4 05/29/2024   MCV 93 05/29/2024   MCH 30.9 05/29/2024   RDW 12.5 05/29/2024   PLT 422 05/29/2024   Last metabolic panel Lab Results  Component Value Date   GLUCOSE 86 05/29/2024   NA 138 05/29/2024   K 4.7 05/29/2024   CL 103 05/29/2024   CO2 16 (L) 05/29/2024   BUN 13 05/29/2024   CREATININE 0.88 05/29/2024   EGFR 84 05/29/2024   CALCIUM  9.3 05/29/2024   PROT 7.0 05/29/2024   ALBUMIN 4.2 05/29/2024   LABGLOB 2.8 05/29/2024   AGRATIO 1.8  08/24/2018   BILITOT 0.5 05/29/2024   ALKPHOS 69 05/29/2024   AST 15 05/29/2024   ALT 11 05/29/2024   Last lipids Lab Results  Component Value Date   CHOL 250 (H) 09/05/2024   HDL 79 09/05/2024   LDLCALC 160 (H) 09/05/2024   TRIG 66 09/05/2024   CHOLHDL 3.2 09/05/2024   Last hemoglobin A1c Lab Results  Component Value Date   HGBA1C 5.3 05/29/2024   Last thyroid  functions Lab Results  Component Value Date   TSH 2.630 05/29/2024   T3TOTAL 171 08/24/2018   FREET4 1.31 01/27/2023   THYROIDAB 108 (H) 03/30/2019   Last vitamin D  Lab Results  Component Value Date   VD25OH 50.1 05/29/2024   Last vitamin B12 and Folate Lab Results  Component Value Date   VITAMINB12 334 01/27/2023   FOLATE 18.2 08/24/2018      The 10-year ASCVD risk score (Arnett DK, et al., 2019) is: 0.4%  Assessment & Plan:  There are no diagnoses linked to this encounter.   No follow-ups on file.   Benton LITTIE Gave, PA

## 2024-09-21 NOTE — Patient Instructions (Signed)
 You have a sinus infection. Please start taking the antibiotic, Augmentin , twice daily with food. Take it for all 10 days, do not stop early just because you feel better. Take an over the counter probiotic or yogurt daily to help prevent diarrhea/ yeast infection.  Use Flonase  daily to help with inflammation of the nasal passage. It is also recommended that you use nasal saline/ sinus washes to cleans the sinus passages. Hot steam from a shower or vaporizer may also be beneficial to help open up the upper airway. Eucalyptus can be helpful.  <-- sinugator

## 2024-09-22 ENCOUNTER — Encounter: Payer: Self-pay | Admitting: Urgent Care

## 2024-10-01 ENCOUNTER — Other Ambulatory Visit (HOSPITAL_COMMUNITY): Payer: Self-pay

## 2024-10-24 ENCOUNTER — Other Ambulatory Visit (HOSPITAL_COMMUNITY): Payer: Self-pay

## 2024-10-27 ENCOUNTER — Other Ambulatory Visit: Payer: Self-pay

## 2024-10-29 ENCOUNTER — Other Ambulatory Visit (HOSPITAL_COMMUNITY): Payer: Self-pay

## 2024-12-13 NOTE — Progress Notes (Unsigned)
" °  Electrophysiology Office Note:   Date:  12/14/2024  ID:  Madison [name], DOB 18-Aug-1981, MRN [id number]  Primary Cardiologist: None Primary Heart Failure: None Electrophysiologist: Will Gladis Norton, MD      History of Present Illness:   Madison [name] is a 44 y.o. female with h/o SVT, hypothyroidism, depression / anxiety, obesity seen today for routine electrophysiology followup.   Since last being seen in our clinic the patient reports doing well. She has been working with a nutritionist and has lost 58 lbs. She has recovered from  her meniscus surgery. 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  She has not had any episodes of SVT since 2024.  She keeps a record in her phone when they occur. She notes they typically happen at 0300 and wake her up. No issues with Cardizem . Only occasional palpitations.  She is working out at [employer]. She denies chest pain, palpitations, dyspnea, PND, orthopnea, nausea, vomiting, dizziness, syncope, edema, weight gain, or early satiety.   Review of systems complete and found to be negative unless listed in HPI.   EP Information / Studies Reviewed:    EKG is ordered today. Personal review as below.  EKG Interpretation Date/Time:  Thursday December 14 2024 08:12:18 EST Ventricular Rate:  83 PR Interval:  132 QRS Duration:  74 QT Interval:  368 QTC Calculation: 432 R Axis:   81  Text Interpretation: Normal sinus rhythm Confirmed by Aniceto Jarvis ([id number]) on 12/14/2024 8:16:18 AM    Arrhythmia / AAD / Pertinent EP Studies SVT > dx ~ 2018 Cardiac Monitor 01/2017 > NSR with ave HR 87 bpm, SVT -likely ST up to 175 bpm       Physical Exam:   VS:  BP 126/76   Pulse 83   Ht 5' 5 (1.651 m)   Wt 191 lb (86.6 kg)   SpO2 98%   BMI 31.78 kg/m    Wt Readings from Last 3 Encounters:  12/14/24 191 lb (86.6 kg)  09/21/24 194 lb (88 kg)  09/05/24 198 lb (89.8 kg)     GEN: Well nourished, well developed in no acute distress NECK:  No JVD; No carotid bruits CARDIAC: Regular rate and rhythm, no murmurs, rubs, gallops RESPIRATORY:  Clear to auscultation without rales, wheezing or rhonchi  ABDOMEN: Soft, non-tender, non-distended EXTREMITIES:  No edema; No deformity   Risk Assessment/Calculations:       ASSESSMENT AND PLAN:    SVT  Prior ST on cardiac monitor  -Diltiazem  300 mg daily > we discussed if she has further weight loss, we may need to reduce the dose if she has symptoms - lightheadedness / dizziness etc -EKG with NSR   -HR range by her Apple Watch is 58-112 bpm  -no significant symptom burden, reviewed if new sx to call and we can assess a cardiac monitor +/- EPS   Follow up with EP APP in 12 months  Signed, Jarvis Aniceto, NP-C, AGACNP-BC [facility] - Electrophysiology  12/14/2024, 8:16 AM  "

## 2024-12-14 ENCOUNTER — Encounter: Payer: Self-pay | Admitting: Pulmonary Disease

## 2024-12-14 ENCOUNTER — Other Ambulatory Visit (HOSPITAL_COMMUNITY): Payer: Self-pay

## 2024-12-14 ENCOUNTER — Ambulatory Visit: Admitting: Pulmonary Disease

## 2024-12-14 VITALS — BP 126/76 | HR 83 | Ht 65.0 in | Wt 191.0 lb

## 2024-12-14 DIAGNOSIS — I471 Supraventricular tachycardia, unspecified: Secondary | ICD-10-CM | POA: Diagnosis not present

## 2024-12-14 MED ORDER — DILTIAZEM HCL ER COATED BEADS 300 MG PO CP24
300.0000 mg | ORAL_CAPSULE | Freq: Every day | ORAL | 3 refills | Status: AC
  Filled 2024-12-14: qty 90, 90d supply, fill #0

## 2024-12-14 NOTE — Patient Instructions (Signed)
 Medication Instructions:  Your physician recommends that you continue on your current medications as directed. Please refer to the Current Medication list given to you today.  *If you need a refill on your cardiac medications before your next appointment, please call your pharmacy*  Lab Work: None ordered If you have labs (blood work) drawn today and your tests are completely normal, you will receive your results only by: MyChart Message (if you have MyChart) OR A paper copy in the mail If you have any lab test that is abnormal or we need to change your treatment, we will call you to review the results.  Follow-Up: At [facility], you and your health needs are our priority.  As part of our continuing mission to provide you with exceptional heart care, our providers are all part of one team.  This team includes your primary Cardiologist (physician) and Advanced Practice Providers or APPs (Physician Assistants and Nurse Practitioners) who all work together to provide you with the care you need, when you need it.  Your next appointment:   1 year(s)  Provider:   Soyla Norton, MD or Daphne Barrack, NP

## 2025-09-06 ENCOUNTER — Encounter: Admitting: Urgent Care
